# Patient Record
Sex: Male | Born: 1935 | Race: White | Hispanic: No | Marital: Married | Smoking: Never smoker
Health system: Southern US, Community
[De-identification: ages and names within clinical notes are randomized; demographics above are authoritative.]

## PROBLEM LIST (undated history)

## (undated) DIAGNOSIS — H9190 Unspecified hearing loss, unspecified ear: Secondary | ICD-10-CM

## (undated) DIAGNOSIS — C801 Malignant (primary) neoplasm, unspecified: Secondary | ICD-10-CM

## (undated) DIAGNOSIS — R638 Other symptoms and signs concerning food and fluid intake: Secondary | ICD-10-CM

## (undated) DIAGNOSIS — Z9289 Personal history of other medical treatment: Secondary | ICD-10-CM

## (undated) DIAGNOSIS — E7800 Pure hypercholesterolemia, unspecified: Secondary | ICD-10-CM

## (undated) DIAGNOSIS — E785 Hyperlipidemia, unspecified: Secondary | ICD-10-CM

## (undated) DIAGNOSIS — R590 Localized enlarged lymph nodes: Secondary | ICD-10-CM

## (undated) DIAGNOSIS — I10 Essential (primary) hypertension: Secondary | ICD-10-CM

## (undated) DIAGNOSIS — I259 Chronic ischemic heart disease, unspecified: Secondary | ICD-10-CM

## (undated) DIAGNOSIS — C099 Malignant neoplasm of tonsil, unspecified: Secondary | ICD-10-CM

## (undated) DIAGNOSIS — N289 Disorder of kidney and ureter, unspecified: Secondary | ICD-10-CM

## (undated) DIAGNOSIS — C799 Secondary malignant neoplasm of unspecified site: Secondary | ICD-10-CM

## (undated) DIAGNOSIS — Z9221 Personal history of antineoplastic chemotherapy: Secondary | ICD-10-CM

## (undated) DIAGNOSIS — M1990 Unspecified osteoarthritis, unspecified site: Secondary | ICD-10-CM

## (undated) DIAGNOSIS — Z5111 Encounter for antineoplastic chemotherapy: Secondary | ICD-10-CM

## (undated) DIAGNOSIS — R479 Unspecified speech disturbances: Secondary | ICD-10-CM

## (undated) HISTORY — DX: Localized enlarged lymph nodes: R59.0

## (undated) HISTORY — PX: CATARACT EXTRACTION: SUR2

## (undated) HISTORY — DX: Encounter for antineoplastic chemotherapy: Z51.11

## (undated) HISTORY — DX: Personal history of antineoplastic chemotherapy: Z92.21

## (undated) HISTORY — DX: Unspecified osteoarthritis, unspecified site: M19.90

## (undated) HISTORY — DX: Disorder of kidney and ureter, unspecified: N28.9

## (undated) HISTORY — DX: Unspecified speech disturbances: R47.9

## (undated) HISTORY — DX: Personal history of other medical treatment: Z92.89

## (undated) HISTORY — DX: Pure hypercholesterolemia, unspecified: E78.00

## (undated) HISTORY — DX: Malignant neoplasm of tonsil, unspecified: C09.9

## (undated) HISTORY — DX: Malignant (primary) neoplasm, unspecified: C80.1

## (undated) HISTORY — DX: Hyperlipidemia, unspecified: E78.5

## (undated) HISTORY — DX: Unspecified hearing loss, unspecified ear: H91.90

## (undated) HISTORY — DX: Chronic ischemic heart disease, unspecified: I25.9

## (undated) HISTORY — DX: Secondary malignant neoplasm of unspecified site: C79.9

## (undated) HISTORY — PX: TONSILLECTOMY: SUR1361

## (undated) HISTORY — DX: Other symptoms and signs concerning food and fluid intake: R63.8

## (undated) HISTORY — DX: Essential (primary) hypertension: I10

---

## 1975-12-10 DIAGNOSIS — C801 Malignant (primary) neoplasm, unspecified: Secondary | ICD-10-CM

## 1975-12-10 HISTORY — DX: Malignant (primary) neoplasm, unspecified: C80.1

## 2001-07-25 ENCOUNTER — Ambulatory Visit (HOSPITAL_COMMUNITY): Admission: RE | Admit: 2001-07-25 | Discharge: 2001-07-25 | Payer: Self-pay | Admitting: *Deleted

## 2003-04-08 ENCOUNTER — Ambulatory Visit (HOSPITAL_COMMUNITY): Admission: RE | Admit: 2003-04-08 | Discharge: 2003-04-08 | Payer: Self-pay | Admitting: Gastroenterology

## 2007-12-08 HISTORY — PX: CARDIOVASCULAR STRESS TEST: SHX262

## 2009-08-09 HISTORY — PX: OTHER SURGICAL HISTORY: SHX169

## 2009-09-04 ENCOUNTER — Inpatient Hospital Stay (HOSPITAL_COMMUNITY): Admission: RE | Admit: 2009-09-04 | Discharge: 2009-09-05 | Payer: Self-pay | Admitting: Cardiology

## 2009-09-04 HISTORY — PX: CARDIAC CATHETERIZATION: SHX172

## 2009-09-13 ENCOUNTER — Inpatient Hospital Stay (HOSPITAL_COMMUNITY): Admission: RE | Admit: 2009-09-13 | Discharge: 2009-09-14 | Payer: Self-pay | Admitting: Cardiology

## 2009-09-13 HISTORY — PX: CARDIAC CATHETERIZATION: SHX172

## 2010-07-23 ENCOUNTER — Ambulatory Visit: Payer: Self-pay | Admitting: Cardiology

## 2011-01-29 ENCOUNTER — Ambulatory Visit (INDEPENDENT_AMBULATORY_CARE_PROVIDER_SITE_OTHER): Payer: Medicare Other | Admitting: Cardiology

## 2011-01-29 DIAGNOSIS — I2510 Atherosclerotic heart disease of native coronary artery without angina pectoris: Secondary | ICD-10-CM

## 2011-01-29 DIAGNOSIS — E7800 Pure hypercholesterolemia, unspecified: Secondary | ICD-10-CM

## 2011-01-29 DIAGNOSIS — I10 Essential (primary) hypertension: Secondary | ICD-10-CM

## 2011-03-14 LAB — BASIC METABOLIC PANEL
Calcium: 8.7 mg/dL (ref 8.4–10.5)
Creatinine, Ser: 1.67 mg/dL — ABNORMAL HIGH (ref 0.4–1.5)
GFR calc Af Amer: 49 mL/min — ABNORMAL LOW (ref >60)
GFR calc non Af Amer: 41 mL/min — ABNORMAL LOW (ref >60)
Glucose, Bld: 101 mg/dL — ABNORMAL HIGH (ref 70–99)
Sodium: 139 mEq/L (ref 135–145)

## 2011-03-14 LAB — CBC
Hemoglobin: 12.1 g/dL — ABNORMAL LOW (ref 13.0–17.0)
RDW: 12.2 % (ref 11.5–15.5)

## 2011-03-15 LAB — CBC
HCT: 40.7 % (ref 39.0–52.0)
Hemoglobin: 14.1 g/dL (ref 13.0–17.0)
RBC: 4.33 MIL/uL (ref 4.22–5.81)
RDW: 12.6 % (ref 11.5–15.5)
WBC: 7.1 10*3/uL (ref 4.0–10.5)

## 2011-03-15 LAB — BASIC METABOLIC PANEL
GFR calc Af Amer: 50 mL/min — ABNORMAL LOW (ref >60)
GFR calc non Af Amer: 42 mL/min — ABNORMAL LOW (ref >60)
Glucose, Bld: 98 mg/dL (ref 70–99)
Potassium: 4.5 mEq/L (ref 3.5–5.1)
Sodium: 139 mEq/L (ref 135–145)

## 2011-04-26 NOTE — Op Note (Signed)
   NAME:  Jose Frank, Jose Frank                          ACCOUNT NO.:  [id number]   MEDICAL RECORD NO.:  [id number]                   PATIENT TYPE:  AMB   LOCATION:  ENDO                                 FACILITY:  [facility]   PHYSICIAN:  James L. Malon Kindle., M.D.          DATE OF BIRTH:  1935-09-14   DATE OF PROCEDURE:  04/08/2003  DATE OF DISCHARGE:                                 OPERATIVE REPORT   PROCEDURE:  Colonoscopy.   MEDICATIONS:  Fentanyl 50 mcg, Versed 5 mg IV.   INDICATIONS:  Colon cancer screening.   DESCRIPTION OF PROCEDURE:  The procedure had been explained to the patient  and consent obtained.  With the patient in the left lateral decubitus  position, the Olympus scope was inserted and advanced under direct  visualization.  The prep was excellent, and we were able to reach the cecum  without difficulty, the appendiceal orifice and the ileocecal valve seen.  The scope withdrawn and the cecum, ascending colon, descending, and sigmoid  colon were seen well.  No polyps were seen.  The rectum was free of polyps  as well.  The scope was withdrawn.  The patient tolerated the procedure well  and was maintained on low-flow oxygen and pulse oximetry throughout the  procedure.   ASSESSMENT:  Normal screening colonoscopy.  Will recommend checking the  stool yearly for blood and possibly repeating the procedure in 10 years if  clinically appropriate.                                               James L. Malon Kindle., M.D.    Waldron Session  D:  04/08/2003  T:  04/08/2003  Job:  [id number]   cc:   Al Decant. Janey Greaser, M.D.  [address]  [place]  [place] [address]  Fax: [phone]

## 2011-07-19 ENCOUNTER — Encounter: Payer: Self-pay | Admitting: Nurse Practitioner

## 2011-07-29 ENCOUNTER — Encounter: Payer: Self-pay | Admitting: Nurse Practitioner

## 2011-07-29 ENCOUNTER — Ambulatory Visit (INDEPENDENT_AMBULATORY_CARE_PROVIDER_SITE_OTHER): Payer: Medicare Other | Admitting: Nurse Practitioner

## 2011-07-29 ENCOUNTER — Other Ambulatory Visit: Payer: Medicare Other | Admitting: *Deleted

## 2011-07-29 VITALS — BP 140/94 | HR 70 | Ht 71.0 in | Wt 193.0 lb

## 2011-07-29 DIAGNOSIS — I10 Essential (primary) hypertension: Secondary | ICD-10-CM

## 2011-07-29 DIAGNOSIS — N189 Chronic kidney disease, unspecified: Secondary | ICD-10-CM

## 2011-07-29 DIAGNOSIS — N289 Disorder of kidney and ureter, unspecified: Secondary | ICD-10-CM | POA: Insufficient documentation

## 2011-07-29 DIAGNOSIS — I2510 Atherosclerotic heart disease of native coronary artery without angina pectoris: Secondary | ICD-10-CM

## 2011-07-29 DIAGNOSIS — E785 Hyperlipidemia, unspecified: Secondary | ICD-10-CM

## 2011-07-29 LAB — LIPID PANEL
Cholesterol: 160 mg/dL (ref 0–200)
HDL: 61.3 mg/dL (ref >39.00)
LDL Cholesterol: 79 mg/dL (ref 0–99)
Total CHOL/HDL Ratio: 3
Triglycerides: 99 mg/dL (ref 0.0–149.0)
VLDL: 19.8 mg/dL (ref 0.0–40.0)

## 2011-07-29 LAB — CBC WITH DIFFERENTIAL/PLATELET
Basophils Absolute: 0 10*3/uL (ref 0.0–0.1)
Basophils Relative: 0.6 % (ref 0.0–3.0)
Eosinophils Absolute: 0.1 10*3/uL (ref 0.0–0.7)
Eosinophils Relative: 2.1 % (ref 0.0–5.0)
HCT: 45.2 % (ref 39.0–52.0)
Hemoglobin: 15.2 g/dL (ref 13.0–17.0)
Lymphocytes Relative: 29.8 % (ref 12.0–46.0)
Lymphs Abs: 1.6 10*3/uL (ref 0.7–4.0)
MCHC: 33.6 g/dL (ref 30.0–36.0)
MCV: 97.2 fl (ref 78.0–100.0)
Monocytes Absolute: 0.4 10*3/uL (ref 0.1–1.0)
Monocytes Relative: 6.7 % (ref 3.0–12.0)
Neutro Abs: 3.3 10*3/uL (ref 1.4–7.7)
Neutrophils Relative %: 60.8 % (ref 43.0–77.0)
Platelets: 174 10*3/uL (ref 150.0–400.0)
RBC: 4.65 Mil/uL (ref 4.22–5.81)
RDW: 12.9 % (ref 11.5–14.6)
WBC: 5.5 10*3/uL (ref 4.5–10.5)

## 2011-07-29 LAB — BASIC METABOLIC PANEL
BUN: 26 mg/dL — ABNORMAL HIGH (ref 6–23)
CO2: 26 mEq/L (ref 19–32)
Calcium: 9.5 mg/dL (ref 8.4–10.5)
Chloride: 108 mEq/L (ref 96–112)
Creatinine, Ser: 1.7 mg/dL — ABNORMAL HIGH (ref 0.4–1.5)
GFR: 43.2 mL/min — ABNORMAL LOW (ref >60.00)
Glucose, Bld: 110 mg/dL — ABNORMAL HIGH (ref 70–99)
Potassium: 5.9 mEq/L — ABNORMAL HIGH (ref 3.5–5.1)
Sodium: 143 mEq/L (ref 135–145)

## 2011-07-29 NOTE — Progress Notes (Signed)
    Jose Frank Date of Birth: [birth year]   History of Present Illness: Jose Frank is seen back today for his 6 month visit. He is seen for Dr. Excell Seltzer. He is a former patient of Dr. Ronnald Nian. He is doing well. Has just returned from trip to [place]. He has known CAD with prior PCI's. His last procedure was in September/October of 2010 when he had DES to the LCX followed by 4 DES to the RCA. He is on Plavix. He does bruise easily. No chest pain. He remains active. Blood pressure at home is good. He did not take his medicines today.   Current Outpatient Prescriptions on File Prior to Visit  Medication Sig Dispense Refill  . aspirin 81 MG tablet Take 81 mg by mouth daily.        . clopidogrel (PLAVIX) 75 MG tablet Take 75 mg by mouth daily.        . colchicine 0.6 MG tablet Take 0.6 mg by mouth daily.        . metoprolol (LOPRESSOR) 50 MG tablet Take 50 mg by mouth 2 (two) times daily. 50 MG IN THE AM, 25 MG IN THE PM       . Multiple Vitamin (MULTIVITAMIN) tablet Take 1 tablet by mouth daily.        . nitroGLYCERIN (NITROSTAT) 0.4 MG SL tablet Place 0.4 mg under the tongue every 5 (five) minutes as needed.        . simvastatin (ZOCOR) 40 MG tablet Take 40 mg by mouth at bedtime.          Allergies  Allergen Reactions  . Demerol   . Ibuprofen     Past Medical History  Diagnosis Date  . IHD (ischemic heart disease)     Remote PCI to distal RCA and atherectomy of th LAD in 1996; S/P PCI to LCX and RCA in 2010.  Marland Kitchen Hyperlipidemia   . Hypertension   . Renal insufficiency     on Diovan    Past Surgical History  Procedure Date  . Cardiac catheterization 09/13/2009  . Cardiac catheterization 09/04/2009    EF 60%  . Tonsillectomy   . Pci 08/2009    TO THE LCX AND RCA  . Cardiovascular stress test 12/08/2007    EF 57%    History  Smoking status  . Never Smoker   Smokeless tobacco  . Not on file    History  Alcohol Use No    Family History  Problem Relation Age of Onset  .  Stroke Mother   . Heart disease Father     Review of Systems: The review of systems is positive for mild arthritis. No chest pain or shortness of breath. He does bruise easily.  All other systems were reviewed and are negative.  Physical Exam: BP 140/94  Pulse 70  Ht 5\' 11"  (1.803 m)  Wt 193 lb (87.544 kg)  BMI 26.92 kg/m2  LABORATORY DATA: PENDING  Assessment / Plan:

## 2011-07-29 NOTE — Assessment & Plan Note (Signed)
Labs are checked today. Remains off of his ARB.

## 2011-07-29 NOTE — Patient Instructions (Addendum)
Stay on your current medicines. Continue to monitor your blood pressure at home.  We will see you back in 6 months. We will consider stress testing on your return. You will see Dr. Tonny Bollman at your next visit. Call for any problems.

## 2011-07-29 NOTE — Assessment & Plan Note (Signed)
No medicines taken today. His readings are good from home. He will continue to monitor. No change in his medicines.

## 2011-07-29 NOTE — Assessment & Plan Note (Signed)
Labs are checked today.  

## 2011-07-29 NOTE — Assessment & Plan Note (Signed)
He is doing well. No return of chest pain. Will see back in 6 months. Patient is agreeable to this plan and will call if any problems develop in the interim.

## 2011-07-30 ENCOUNTER — Telehealth: Payer: Self-pay | Admitting: *Deleted

## 2011-07-30 NOTE — Telephone Encounter (Signed)
Notified of lab results. Sent copy to Dr. Zachery Dauer and to patient.

## 2011-07-30 NOTE — Telephone Encounter (Signed)
Message copied by Lorayne Bender on Tue Jul 30, 2011 11:35 AM ------      Message from: Rosalio Macadamia      Created: Mon Jul 29, 2011  3:38 PM       Ok to report. Labs are satisfactory.  Has known renal insuffiency. Needs to get back on diet and exercise plan. Blood sugar a little higher this time. Potassium is up and probably needs repeat potassium. ? Eating lots of fruit? Stay on same medicines. Recheck BMET/HPF/LIPIDS  in 6 months.

## 2011-11-20 ENCOUNTER — Other Ambulatory Visit (HOSPITAL_COMMUNITY): Payer: Self-pay | Admitting: Gastroenterology

## 2011-11-26 ENCOUNTER — Ambulatory Visit (HOSPITAL_COMMUNITY)
Admission: RE | Admit: 2011-11-26 | Discharge: 2011-11-26 | Disposition: A | Discharge status: Home / Self Care | Payer: Medicare Other | Source: Ambulatory Visit | Attending: Gastroenterology | Admitting: Gastroenterology

## 2011-11-26 DIAGNOSIS — Z85819 Personal history of malignant neoplasm of unspecified site of lip, oral cavity, and pharynx: Secondary | ICD-10-CM | POA: Insufficient documentation

## 2011-11-26 DIAGNOSIS — R1310 Dysphagia, unspecified: Secondary | ICD-10-CM | POA: Insufficient documentation

## 2011-12-09 ENCOUNTER — Other Ambulatory Visit: Payer: Self-pay | Admitting: Nurse Practitioner

## 2011-12-16 ENCOUNTER — Ambulatory Visit (INDEPENDENT_AMBULATORY_CARE_PROVIDER_SITE_OTHER): Payer: Medicare Other | Admitting: Surgery

## 2011-12-16 ENCOUNTER — Encounter (INDEPENDENT_AMBULATORY_CARE_PROVIDER_SITE_OTHER): Payer: Self-pay | Admitting: Surgery

## 2011-12-16 VITALS — BP 140/94 | HR 64 | Temp 97.8°F | Resp 18 | Ht 72.0 in | Wt 189.0 lb

## 2011-12-16 DIAGNOSIS — D171 Benign lipomatous neoplasm of skin and subcutaneous tissue of trunk: Secondary | ICD-10-CM | POA: Insufficient documentation

## 2011-12-16 DIAGNOSIS — D1739 Benign lipomatous neoplasm of skin and subcutaneous tissue of other sites: Secondary | ICD-10-CM

## 2011-12-16 NOTE — Progress Notes (Signed)
Patient ID: Jose Frank, male   DOB: Dec 15, 1934, 77 y.o.   MRN: [id number]  Chief Complaint  Patient presents with  . Other    new pt- determine a seb cyst vs. lipoma on buttock    HPI Jose Frank is a 77 y.o. male.  The patient presents at the request of Dr. Zachery Dauer due to a mass overlying his right buttock.  It has been present for 10 years. It has not grown much over 10 years. There is no history of drainage. There is no history of infection. It causes mild discomfort when sitting.  HPI  Past Medical History  Diagnosis Date  . IHD (ischemic heart disease)     Remote PCI to distal RCA and atherectomy of th LAD in 1996; S/P PCI to LCX and RCA in 2010.  Marland Kitchen Hyperlipidemia   . Hypertension   . Renal insufficiency     on Diovan  . Cancer     tonsil    Past Surgical History  Procedure Date  . Cardiac catheterization 09/13/2009  . Cardiac catheterization 09/04/2009    EF 60%  . Tonsillectomy   . Pci 08/2009    TO THE LCX AND RCA  . Cardiovascular stress test 12/08/2007    EF 57%    Family History  Problem Relation Age of Onset  . Stroke Mother   . Heart disease Father     Social History History  Substance Use Topics  . Smoking status: Never Smoker   . Smokeless tobacco: Not on file  . Alcohol Use: Yes     occasional    Allergies  Allergen Reactions  . Demerol   . Ibuprofen     Current Outpatient Prescriptions  Medication Sig Dispense Refill  . aspirin 81 MG tablet Take 81 mg by mouth daily.        . clopidogrel (PLAVIX) 75 MG tablet TAKE 1 TABLET DAILY  90 tablet  2  . glucosamine-chondroitin 500-400 MG tablet Take 1 tablet by mouth 2 (two) times daily.        . metoprolol (LOPRESSOR) 50 MG tablet Take 50 mg by mouth 2 (two) times daily. 50 MG IN THE AM, 25 MG IN THE PM       . Multiple Vitamin (MULTIVITAMIN) tablet Take 1 tablet by mouth daily.        . nitroGLYCERIN (NITROSTAT) 0.4 MG SL tablet Place 0.4 mg under the tongue every 5 (five) minutes as  needed.        . simvastatin (ZOCOR) 40 MG tablet Take 40 mg by mouth at bedtime.          Review of Systems Review of Systems  Constitutional: Negative for fever, chills and unexpected weight change.  HENT: Negative for hearing loss, congestion, sore throat, trouble swallowing and voice change.   Eyes: Negative for visual disturbance.  Respiratory: Negative for cough and wheezing.   Cardiovascular: Negative for chest pain, palpitations and leg swelling.  Gastrointestinal: Negative for nausea, vomiting, abdominal pain, diarrhea, constipation, blood in stool, abdominal distention, anal bleeding and rectal pain.  Genitourinary: Negative for hematuria and difficulty urinating.  Musculoskeletal: Negative for arthralgias.  Skin: Negative for rash and wound.  Neurological: Negative for seizures, syncope, weakness and headaches.  Hematological: Negative for adenopathy. Does not bruise/bleed easily.  Psychiatric/Behavioral: Negative for confusion.    Blood pressure 140/94, pulse 64, temperature 97.8 F (36.6 C), temperature source Temporal, resp. rate 18, height 6' (1.829 m), weight 189 lb (85.73  kg).  Physical Exam Physical Exam  Constitutional: He appears well-developed and well-nourished.  HENT:  Head: Normocephalic and atraumatic.  Eyes: Pupils are equal, round, and reactive to light.  Neck: Normal range of motion.  Cardiovascular: Normal rate and regular rhythm.   Pulmonary/Chest: Effort normal and breath sounds normal.  Musculoskeletal: Normal range of motion.  Skin: Skin is warm and dry.     Psychiatric: He has a normal mood and affect. His speech is normal and behavior is normal.    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    Plan    It has been present for 10 years without change.  He does not wish to have it removed at this point.  Will follow up as needed.       [name]. 12/16/2011, 10:51 AM

## 2011-12-16 NOTE — Patient Instructions (Signed)
Lipoma A lipoma is a noncancerous (benign) tumor composed of fat cells. They are usually found under the skin (subcutaneous). A lipoma may occur in any tissue of the body that contains fat. Common areas for lipomas to appear include the back, shoulders, buttocks, and thighs. Lipomas are a very common soft tissue growth. They are soft and grow slowly. Most problems caused by a lipoma depend on where it is growing. DIAGNOSIS  A lipoma can be diagnosed with a physical exam. These tumors rarely become cancerous, but radiographic studies can help determine this for certain. Studies used may include:  Computerized X-ray scans (CT or CAT scan).   Computerized magnetic scans (MRI).  TREATMENT  Small lipomas that are not causing problems may be watched. If a lipoma continues to enlarge or causes problems, removal is often the best treatment. Lipomas can also be removed to improve appearance. Surgery is done to remove the fatty cells and the surrounding capsule. Most often, this is done with medicine that numbs the area (local anesthetic). The removed tissue is examined under a microscope to make sure it is not cancerous. Keep all follow-up appointments with your caregiver. SEEK MEDICAL CARE IF:   The lipoma becomes larger or hard.   The lipoma becomes painful, red, or increasingly swollen. These could be signs of infection or a more serious condition.  Document Released: 11/15/2002 Document Revised: 08/07/2011 Document Reviewed: 04/27/2010 [employer] Patient Information 2012 [employer]. 

## 2011-12-31 DIAGNOSIS — R1310 Dysphagia, unspecified: Secondary | ICD-10-CM | POA: Diagnosis not present

## 2012-01-07 DIAGNOSIS — R1310 Dysphagia, unspecified: Secondary | ICD-10-CM | POA: Diagnosis not present

## 2012-02-11 ENCOUNTER — Encounter: Payer: Self-pay | Admitting: Cardiovascular Disease

## 2012-02-11 DIAGNOSIS — E7800 Pure hypercholesterolemia, unspecified: Secondary | ICD-10-CM | POA: Diagnosis not present

## 2012-02-11 DIAGNOSIS — I10 Essential (primary) hypertension: Secondary | ICD-10-CM | POA: Diagnosis not present

## 2012-02-19 DIAGNOSIS — N401 Enlarged prostate with lower urinary tract symptoms: Secondary | ICD-10-CM | POA: Diagnosis not present

## 2012-02-26 DIAGNOSIS — N401 Enlarged prostate with lower urinary tract symptoms: Secondary | ICD-10-CM | POA: Diagnosis not present

## 2012-02-26 DIAGNOSIS — R351 Nocturia: Secondary | ICD-10-CM | POA: Diagnosis not present

## 2012-03-05 ENCOUNTER — Encounter: Payer: Self-pay | Admitting: Cardiovascular Disease

## 2012-03-05 ENCOUNTER — Ambulatory Visit (INDEPENDENT_AMBULATORY_CARE_PROVIDER_SITE_OTHER): Payer: Medicare Other | Admitting: Cardiovascular Disease

## 2012-03-05 VITALS — BP 143/86 | HR 66 | Ht 72.0 in | Wt 191.0 lb

## 2012-03-05 DIAGNOSIS — I10 Essential (primary) hypertension: Secondary | ICD-10-CM

## 2012-03-05 DIAGNOSIS — E785 Hyperlipidemia, unspecified: Secondary | ICD-10-CM | POA: Diagnosis not present

## 2012-03-05 DIAGNOSIS — I2510 Atherosclerotic heart disease of native coronary artery without angina pectoris: Secondary | ICD-10-CM

## 2012-03-05 NOTE — Patient Instructions (Signed)
Your physician wants you to follow-up in: 1 year with Dr. Cooper.  You will receive a reminder letter in the mail two months in advance. If you don't receive a letter, please call our office to schedule the follow-up appointment.  

## 2012-03-12 ENCOUNTER — Encounter: Payer: Self-pay | Admitting: Cardiovascular Disease

## 2012-03-12 NOTE — Progress Notes (Signed)
   HPI:  77 year old gentleman presenting for followup of coronary artery disease. The patient has undergone multiple PCI procedures, last in 2001 he underwent drug-eluting stent implantation in the left circumflex and right coronary arteries. He is maintained on long-term Plavix.  The patient feels well and he is physically active. He specifically denies chest pain or pressure. He denies dyspnea, edema, or palpitations. He is compliant with his medical program.  Outpatient Encounter Prescriptions as of 03/05/2012  Medication Sig Dispense Refill  . aspirin 81 MG tablet Take 81 mg by mouth daily.        . clopidogrel (PLAVIX) 75 MG tablet TAKE 1 TABLET DAILY  90 tablet  2  . glucosamine-chondroitin 500-400 MG tablet Take 1 tablet by mouth 2 (two) times daily.        . metoprolol (LOPRESSOR) 50 MG tablet Take 50 mg by mouth 2 (two) times daily. 50 MG IN THE AM, 25 MG IN THE PM       . Multiple Vitamin (MULTIVITAMIN) tablet Take 1 tablet by mouth daily.        . nitroGLYCERIN (NITROSTAT) 0.4 MG SL tablet Place 0.4 mg under the tongue every 5 (five) minutes as needed.        . simvastatin (ZOCOR) 40 MG tablet Take 40 mg by mouth at bedtime.          Allergies  Allergen Reactions  . Demerol   . Ibuprofen     Past Medical History  Diagnosis Date  . IHD (ischemic heart disease)     Remote PCI to distal RCA and atherectomy of th LAD in 1996; S/P PCI to LCX and RCA in 2010.  Marland Kitchen Hyperlipidemia   . Hypertension   . Renal insufficiency     on Diovan  . Cancer     tonsil    ROS: Negative except as per HPI  BP 143/86  Pulse 66  Ht 6' (1.829 m)  Wt 86.637 kg (191 lb)  BMI 25.90 kg/m2  PHYSICAL EXAM: Pt is alert and oriented, NAD HEENT: normal Neck: JVP - normal, carotids 2+= without bruits Lungs: CTA bilaterally CV: RRR without murmur or gallop Abd: soft, NT, Positive BS, no hepatomegaly Ext: no C/C/E, distal pulses intact and equal Skin: warm/dry no rash  EKG:  Normal sinus rhythm  66 beats per minute, within normal limits.  ASSESSMENT AND PLAN:

## 2012-03-12 NOTE — Assessment & Plan Note (Signed)
Lipids are followed by Dr. Zachery Dauer. The patient is on simvastatin 40 mg.

## 2012-03-12 NOTE — Assessment & Plan Note (Signed)
Impression mildly elevated but he reports good blood pressure readings at home. He will continue on metoprolol.

## 2012-03-12 NOTE — Assessment & Plan Note (Signed)
The patient is stable without angina. We discussed whether he should stay on long-term dual antiplatelet therapy, and I would favor this. The patient has multiple stents and he is tolerating the combination of aspirin and Plavix without bleeding problems. He will otherwise continue on his current therapy and followup in 1 year.

## 2012-04-18 DIAGNOSIS — J209 Acute bronchitis, unspecified: Secondary | ICD-10-CM | POA: Diagnosis not present

## 2012-08-12 ENCOUNTER — Other Ambulatory Visit: Payer: Self-pay | Admitting: Nurse Practitioner

## 2012-08-13 ENCOUNTER — Encounter: Payer: Self-pay | Admitting: Cardiovascular Disease

## 2012-08-13 DIAGNOSIS — E7800 Pure hypercholesterolemia, unspecified: Secondary | ICD-10-CM | POA: Diagnosis not present

## 2012-08-13 DIAGNOSIS — I10 Essential (primary) hypertension: Secondary | ICD-10-CM | POA: Diagnosis not present

## 2012-08-13 DIAGNOSIS — Z23 Encounter for immunization: Secondary | ICD-10-CM | POA: Diagnosis not present

## 2012-08-13 DIAGNOSIS — Z79899 Other long term (current) drug therapy: Secondary | ICD-10-CM | POA: Diagnosis not present

## 2012-08-25 DIAGNOSIS — H524 Presbyopia: Secondary | ICD-10-CM | POA: Diagnosis not present

## 2012-08-25 DIAGNOSIS — H2510 Age-related nuclear cataract, unspecified eye: Secondary | ICD-10-CM | POA: Diagnosis not present

## 2012-08-25 DIAGNOSIS — H35039 Hypertensive retinopathy, unspecified eye: Secondary | ICD-10-CM | POA: Diagnosis not present

## 2012-09-08 DIAGNOSIS — R040 Epistaxis: Secondary | ICD-10-CM | POA: Diagnosis not present

## 2012-09-08 DIAGNOSIS — I10 Essential (primary) hypertension: Secondary | ICD-10-CM | POA: Diagnosis not present

## 2012-09-08 DIAGNOSIS — J329 Chronic sinusitis, unspecified: Secondary | ICD-10-CM | POA: Diagnosis not present

## 2013-01-16 ENCOUNTER — Other Ambulatory Visit: Payer: Self-pay | Admitting: Cardiovascular Disease

## 2013-02-18 DIAGNOSIS — Z125 Encounter for screening for malignant neoplasm of prostate: Secondary | ICD-10-CM | POA: Diagnosis not present

## 2013-02-19 ENCOUNTER — Encounter: Payer: Self-pay | Admitting: Cardiovascular Disease

## 2013-02-19 ENCOUNTER — Ambulatory Visit (INDEPENDENT_AMBULATORY_CARE_PROVIDER_SITE_OTHER): Payer: Medicare Other | Admitting: Cardiovascular Disease

## 2013-02-19 VITALS — BP 124/82 | HR 62 | Ht 72.0 in | Wt 183.0 lb

## 2013-02-19 DIAGNOSIS — I2510 Atherosclerotic heart disease of native coronary artery without angina pectoris: Secondary | ICD-10-CM | POA: Diagnosis not present

## 2013-02-19 MED ORDER — NITROGLYCERIN 0.4 MG SL SUBL
0.4000 mg | SUBLINGUAL_TABLET | SUBLINGUAL | Status: DC | PRN
Start: 2013-02-19 — End: 2016-05-13

## 2013-02-19 MED ORDER — CLOPIDOGREL BISULFATE 75 MG PO TABS: ORAL_TABLET | ORAL | Status: DC

## 2013-02-19 NOTE — Progress Notes (Signed)
   HPI:  78 year old gentleman presenting for followup evaluation. The patient has coronary artery disease with a history of multiple PCI procedures. His most recent PCI procedure was in 2010 when he was treated with complex PCI utilizing 4 stents in the right coronary artery.  He continues to jog regularly for exercise. He runs about 5 miles every other day. This takes him approximately one hour and 15 minutes. He specifically denies exertional symptoms. He denies chest pain or pressure, dyspnea, or lightheadedness. He has some aches and pains but no other specific complaints. His joints feel better on days that he runs. He remains compliant with his medications. He admits to some mild hemorrhoidal bleeding, but has had no other bleeding problems on long-term dual antiplatelet therapy.  Outpatient Encounter Prescriptions as of 02/19/2013  Medication Sig Dispense Refill  . aspirin 81 MG tablet Take 81 mg by mouth daily.        . clopidogrel (PLAVIX) 75 MG tablet TAKE 1 TABLET DAILY  90 tablet  0  . glucosamine-chondroitin 500-400 MG tablet Take 1 tablet by mouth 2 (two) times daily.        . metoprolol (LOPRESSOR) 50 MG tablet Take 50 mg by mouth 2 (two) times daily. 50 MG IN THE AM, 25 MG IN THE PM       . Multiple Vitamin (MULTIVITAMIN) tablet Take 1 tablet by mouth daily.        . nitroGLYCERIN (NITROSTAT) 0.4 MG SL tablet Place 0.4 mg under the tongue every 5 (five) minutes as needed.        . simvastatin (ZOCOR) 40 MG tablet Take 40 mg by mouth at bedtime.         No facility-administered encounter medications on file as of 02/19/2013.    Allergies  Allergen Reactions  . Demerol   . Ibuprofen     Past Medical History  Diagnosis Date  . IHD (ischemic heart disease)     Remote PCI to distal RCA and atherectomy of th LAD in 1996; S/P PCI to LCX and RCA in 2010.  Marland Kitchen Hyperlipidemia   . Hypertension   . Renal insufficiency     on Diovan  . Cancer     tonsil    ROS: Negative except as  per HPI  BP 124/82  Pulse 62  Ht 6' (1.829 m)  Wt 83.008 kg (183 lb)  BMI 24.81 kg/m2  PHYSICAL EXAM: Pt is alert and oriented, NAD HEENT: normal Neck: JVP - normal, carotids 2+= without bruits Lungs: CTA bilaterally CV: RRR without murmur or gallop Abd: soft, NT, Positive BS, no hepatomegaly Ext: no C/C/E, distal pulses intact and equal Skin: warm/dry no rash  EKG:  Normal sinus rhythm 62 beats per minute, within normal limits.  ASSESSMENT AND PLAN: 1. Coronary artery disease, native vessel. The patient is stable without anginal symptoms. I have reviewed his previous cardiac catheterization reports. He's had fairly extensive stenting, especially the right coronary artery. I would favor continued dual antiplatelet therapy with aspirin and Plavix. His other medications are appropriate with a statin drug and beta blocker. No changes were made today.  2. Hyperlipidemia. Lipids are followed by his primary care physician. He is on simvastatin.  For followup I will see him back in 12 months unless problems arise.  Tonny Bollman 02/19/2013 4:01 PM

## 2013-02-19 NOTE — Patient Instructions (Addendum)
Your physician wants you to follow-up in: 1 YEAR with Dr Cooper.  You will receive a reminder letter in the mail two months in advance. If you don't receive a letter, please call our office to schedule the follow-up appointment.  Your physician recommends that you continue on your current medications as directed. Please refer to the Current Medication list given to you today.  

## 2013-02-25 DIAGNOSIS — R972 Elevated prostate specific antigen [PSA]: Secondary | ICD-10-CM | POA: Diagnosis not present

## 2013-02-25 DIAGNOSIS — R351 Nocturia: Secondary | ICD-10-CM | POA: Diagnosis not present

## 2013-02-25 DIAGNOSIS — N401 Enlarged prostate with lower urinary tract symptoms: Secondary | ICD-10-CM | POA: Diagnosis not present

## 2013-05-07 DIAGNOSIS — I10 Essential (primary) hypertension: Secondary | ICD-10-CM | POA: Diagnosis not present

## 2013-05-07 DIAGNOSIS — E7800 Pure hypercholesterolemia, unspecified: Secondary | ICD-10-CM | POA: Diagnosis not present

## 2013-05-07 DIAGNOSIS — L821 Other seborrheic keratosis: Secondary | ICD-10-CM | POA: Diagnosis not present

## 2013-05-14 DIAGNOSIS — L821 Other seborrheic keratosis: Secondary | ICD-10-CM | POA: Diagnosis not present

## 2013-05-14 DIAGNOSIS — R209 Unspecified disturbances of skin sensation: Secondary | ICD-10-CM | POA: Diagnosis not present

## 2013-07-07 ENCOUNTER — Telehealth: Payer: Self-pay | Admitting: *Deleted

## 2013-07-07 ENCOUNTER — Ambulatory Visit (INDEPENDENT_AMBULATORY_CARE_PROVIDER_SITE_OTHER)
Admission: RE | Admit: 2013-07-07 | Discharge: 2013-07-07 | Disposition: A | Discharge status: Home / Self Care | Payer: Medicare Other | Source: Ambulatory Visit | Attending: Physician Assistant | Admitting: Physician Assistant

## 2013-07-07 ENCOUNTER — Ambulatory Visit (INDEPENDENT_AMBULATORY_CARE_PROVIDER_SITE_OTHER): Payer: Medicare Other | Admitting: Physician Assistant

## 2013-07-07 ENCOUNTER — Encounter: Payer: Self-pay | Admitting: Physician Assistant

## 2013-07-07 VITALS — BP 138/88 | HR 54 | Ht 70.0 in | Wt 183.0 lb

## 2013-07-07 DIAGNOSIS — R079 Chest pain, unspecified: Secondary | ICD-10-CM

## 2013-07-07 DIAGNOSIS — E785 Hyperlipidemia, unspecified: Secondary | ICD-10-CM

## 2013-07-07 DIAGNOSIS — I2510 Atherosclerotic heart disease of native coronary artery without angina pectoris: Secondary | ICD-10-CM

## 2013-07-07 NOTE — Telephone Encounter (Signed)
pt notified about cxr. I will fax cxr to PCP. Pt verbalized understanding

## 2013-07-07 NOTE — Telephone Encounter (Signed)
Informed pt to not take his metoprolol 24 hr before his exercise myoview. pt agreed

## 2013-07-07 NOTE — Telephone Encounter (Signed)
Message copied by Tarri Fuller on Wed Jul 07, 2013  4:29 PM ------      Message from: [place], [place] T      Created: Wed Jul 07, 2013  1:38 PM       Chest x-ray without acute findings      Continue with current treatment plan.      Tereso Newcomer, PA-C        07/07/2013 1:37 PM ------

## 2013-07-07 NOTE — Patient Instructions (Addendum)
Your physician recommends that you schedule a follow-up appointment in:  KEEP APPOINTMENT WITH DR. Excell Seltzer 02/2014  Your physician has requested that you have en exercise stress myoview. Please follow instruction sheet, as given.  A chest x-ray takes a picture of the organs and structures inside the chest, including the heart, lungs, and blood vessels. This test can show several things, including, whether the heart is enlarges; whether fluid is building up in the lungs; and whether pacemaker / defibrillator leads are still in place.  Your physician recommends that you continue on your current medications as directed. Please refer to the Current Medication list given to you today.  Your physician has recommended you make the following change in your medication:   HOLD YOUR METOPROLOL 24HRS PRIOR TO STRESS MYOVIEW

## 2013-07-07 NOTE — Progress Notes (Signed)
[address] [place], [place]  [address] Phone: [phone] Fax:  [phone]  Date:  07/07/2013   ID:  Jose, Frank 1935-08-31, MRN [id number]  PCP:  Gaye Alken, MD  Cardiologist:  Dr. Tonny Bollman     History of Present Illness: Jose Frank is a 78 y.o. male who returns for the evaluation of chest pain. He has a history of CAD, status post intervention to the distal RCA and proximal LAD in 1996. Last LHC 08/2009: CFX 95%, proximal RCA 90%, distal RCA 95%, proximal LAD 40%, EF 60%, normal wall motion. PCI: Xience DES to the CFX. Staged PCI:  Xience DES x4 to the proximal, mid and distal RCA. Last seen by Dr. Excell Seltzer 02/2013. He typically jogs 3-4 miles every other day without significant problems. Recently he has noted some left upper chest discomfort as well as left shoulder discomfort. It seems to be somewhat positional. He denies dyspnea, syncope, orthopnea, PND or edema. He was concerned that he may have problems with his heart. He decided to stop jogging.   Wt Readings from Last 3 Encounters:  07/07/13 183 lb (83.008 kg)  02/19/13 183 lb (83.008 kg)  03/05/12 191 lb (86.637 kg)     Past Medical History  Diagnosis Date  . IHD (ischemic heart disease)     Remote PCI to distal RCA and atherectomy of th LAD in 1996; S/P PCI to LCX and RCA in 2010.  Marland Kitchen Hyperlipidemia   . Hypertension   . Renal insufficiency     on Diovan  . Cancer     tonsil    Current Outpatient Prescriptions  Medication Sig Dispense Refill  . aspirin 81 MG tablet Take 81 mg by mouth daily.        . clopidogrel (PLAVIX) 75 MG tablet TAKE 1 TABLET DAILY  90 tablet  3  . glucosamine-chondroitin 500-400 MG tablet Take 1 tablet by mouth 2 (two) times daily.        . metoprolol (LOPRESSOR) 50 MG tablet Take 50 mg by mouth 2 (two) times daily. 50 MG IN THE AM, 25 MG IN THE PM       . Multiple Vitamin (MULTIVITAMIN) tablet Take 1 tablet by mouth daily.        .  nitroGLYCERIN (NITROSTAT) 0.4 MG SL tablet Place 1 tablet (0.4 mg total) under the tongue every 5 (five) minutes as needed.  25 tablet  2  . simvastatin (ZOCOR) 40 MG tablet Take 40 mg by mouth at bedtime.         No current facility-administered medications for this visit.    Allergies:    Allergies  Allergen Reactions  . Demerol   . Ibuprofen     Social History:  The patient  reports that he has never smoked. He does not have any smokeless tobacco history on file. He reports that  drinks alcohol. He reports that he does not use illicit drugs.   ROS:  Please see the history of present illness.      All other systems reviewed and negative.   PHYSICAL EXAM: VS:  BP 138/88  Pulse 54  Ht 5\' 10"  (1.778 m)  Wt 183 lb (83.008 kg)  BMI 26.26 kg/m2 Well nourished, well developed, in no acute distress HEENT: normal Neck: no JVD Cardiac:  normal S1, S2; RRR; no murmur Lungs:  clear to auscultation bilaterally, no wheezing, rhonchi or rales Abd: soft, nontender, no hepatomegaly Ext: no edema MSK:  Left shoulder without crepitus or pain on palpation Skin: warm and dry Neuro:  CNs 2-12 intact, no focal abnormalities noted  EKG:  Sinus bradycardia, HR 54, normal axis, no acute changes     ASSESSMENT AND PLAN:  1. Chest Pain: Atypical for ischemia. He has not had stress testing since his last PCI in 2010. He is concerned about ischemia as a cause for his symptoms. I will arrange an ETT-Myoview. I will also arrange a chest x-ray. If these are unrevealing, he should follow up with his PCP. I suspect he may have some type of shoulder pathology contributing to symptoms. Of note, he was a college baseball and football player. 2. CAD: Arrange Myoview as noted. Continue aspirin, Plavix and statin. 3. Hyperlipidemia: Continue statin. 4. Disposition: Follow up with Dr. Excell Seltzer in 02/2014 or sooner if Myoview abnormal.  Signed, Tereso Newcomer, PA-C  07/07/2013 10:29 AM

## 2013-07-14 ENCOUNTER — Ambulatory Visit (HOSPITAL_COMMUNITY): Payer: Medicare Other | Attending: Cardiology | Admitting: Radiology

## 2013-07-14 VITALS — BP 144/87 | HR 61 | Ht 71.0 in | Wt 182.0 lb

## 2013-07-14 DIAGNOSIS — I10 Essential (primary) hypertension: Secondary | ICD-10-CM | POA: Diagnosis not present

## 2013-07-14 DIAGNOSIS — Z9861 Coronary angioplasty status: Secondary | ICD-10-CM | POA: Diagnosis not present

## 2013-07-14 DIAGNOSIS — R5381 Other malaise: Secondary | ICD-10-CM | POA: Diagnosis not present

## 2013-07-14 DIAGNOSIS — I209 Angina pectoris, unspecified: Secondary | ICD-10-CM | POA: Diagnosis not present

## 2013-07-14 DIAGNOSIS — E785 Hyperlipidemia, unspecified: Secondary | ICD-10-CM

## 2013-07-14 DIAGNOSIS — Z8249 Family history of ischemic heart disease and other diseases of the circulatory system: Secondary | ICD-10-CM | POA: Insufficient documentation

## 2013-07-14 DIAGNOSIS — R079 Chest pain, unspecified: Secondary | ICD-10-CM

## 2013-07-14 DIAGNOSIS — I2510 Atherosclerotic heart disease of native coronary artery without angina pectoris: Secondary | ICD-10-CM

## 2013-07-14 DIAGNOSIS — M25519 Pain in unspecified shoulder: Secondary | ICD-10-CM | POA: Diagnosis not present

## 2013-07-14 DIAGNOSIS — I4949 Other premature depolarization: Secondary | ICD-10-CM

## 2013-07-14 MED ORDER — TECHNETIUM TC 99M SESTAMIBI GENERIC - CARDIOLITE
10.0000 | Freq: Once | INTRAVENOUS | Status: AC | PRN
  Administered 2013-07-14: 10 via INTRAVENOUS

## 2013-07-14 MED ORDER — TECHNETIUM TC 99M SESTAMIBI GENERIC - CARDIOLITE
30.0000 | Freq: Once | INTRAVENOUS | Status: AC | PRN
  Administered 2013-07-14: 30 via INTRAVENOUS

## 2013-07-14 NOTE — Progress Notes (Signed)
[facility] SITE 3 NUCLEAR MED [address] [place], [place] [address] [phone]    Cardiology Nuclear Med Study  Jose Frank is a 78 y.o. male     MRN : [id number]     DOB: [birth year]  Procedure Date: 07/14/2013  Nuclear Med Background Indication for Stress Test:  Evaluation for Ischemia and Stent Patency History:  '96 PTCA; '08 MPS:EF=57% (no report); '10 Stents x 5 Cardiac Risk Factors: Family History - CAD, Hypertension and Lipids  Symptoms:  Fatigue and (L) Shoulder Pain (last episode being yesterday)   Nuclear Pre-Procedure Caffeine/Decaff Intake:  None NPO After: 5 pm   Lungs:  Clear. O2 Sat: 95% on room air. IV 0.9% NS with Angio Cath:  22g  IV Site: R Hand  IV Started by:  Bonnita Levan, RN  Chest Size (in):  42 Cup Size: n/a  Height: 5\' 11"  (1.803 m)  Weight:  182 lb (82.555 kg)  BMI:  Body mass index is 25.4 kg/(m^2). Tech Comments:  Patient held Lopressor x 24 hrs    Nuclear Med Study 1 or 2 day study: 1 day  Stress Test Type:  Stress  Reading MD: Marca Ancona, MD  Order Authorizing Provider:  Tonny Bollman, MD  Resting Radionuclide: Technetium 84m Sestamibi  Resting Radionuclide Dose: 11.0 mCi   Stress Radionuclide:  Technetium 9m Sestamibi  Stress Radionuclide Dose: 33.0 mCi           Stress Protocol Rest HR: 61 Stress HR: 137  Rest BP: 144/87 Stress BP: 212/94  Exercise Time (min): 6:30 METS: 7.0   Predicted Max HR: 144 bpm % Max HR: 95.14 bpm Rate Pressure Product: 21308   Dose of Adenosine (mg):  n/a Dose of Lexiscan: n/a mg  Dose of Atropine (mg): n/a Dose of Dobutamine: n/a mcg/kg/min (at max HR)  Stress Test Technologist: Smiley Houseman, CMA-N  Nuclear Technologist:  Harlow Asa, CNMT     Rest Procedure:  Myocardial perfusion imaging was performed at rest 45 minutes following the intravenous administration of Technetium 43m Sestamibi.  Rest ECG: NSR - Normal EKG  Stress Procedure:  The patient exercised on the  treadmill utilizing the Bruce Protocol for 6:30 minutes. The patient stopped due to a hypertensive response, while off his Lopressor for 24-hours as directed.  He denied any chest pain.  Technetium 75m Sestamibi was injected at peak exercise and myocardial perfusion imaging was performed after a brief delay.  Stress ECG: No significant change from baseline ECG  QPS Raw Data Images:  Normal; no motion artifact; normal heart/lung ratio. Stress Images:  Normal homogeneous uptake in all areas of the myocardium. Rest Images:  Normal homogeneous uptake in all areas of the myocardium. Subtraction (SDS):  There is no evidence of scar or ischemia. Transient Ischemic Dilatation (Normal <1.22):  n/a Lung/Heart Ratio (Normal <0.45):  0.36  Quantitative Gated Spect Images QGS EDV:  110 ml QGS ESV:  53 ml  Impression Exercise Capacity:  Fair exercise capacity. BP Response:  Hypertensive blood pressure response. Clinical Symptoms:  Fatigue, no chest pain.  ECG Impression:  No significant ST segment change suggestive of ischemia. Comparison with Prior Nuclear Study: No images to compare  Overall Impression:  Normal stress nuclear study except low normal to mildly decreased LV systolic function.   LV Ejection Fraction: 52%.  LV Wall Motion:  Mild global hypokinesis.   Marca Ancona 07/14/2013

## 2013-07-15 ENCOUNTER — Encounter: Payer: Self-pay | Admitting: Physician Assistant

## 2013-07-16 ENCOUNTER — Telehealth: Payer: Self-pay | Admitting: Nurse Practitioner

## 2013-07-16 DIAGNOSIS — I2510 Atherosclerotic heart disease of native coronary artery without angina pectoris: Secondary | ICD-10-CM

## 2013-07-16 NOTE — Telephone Encounter (Signed)
Reviewed stress test results with patient who verbalized understanding.  Patient in agreement to have ECHO and understands that someone from scheduling will call him with appointment.

## 2013-07-16 NOTE — Telephone Encounter (Signed)
Message copied by Levi Aland on Fri Jul 16, 2013 12:04 PM ------      Message from: [place], [place] T      Created: Thu Jul 15, 2013  1:57 PM       Stress test with normal perfusion      EF low normal - likely related to nuclear technique      Schedule Echocardiogram to assess LVF better.      Tereso Newcomer, PA-C        07/15/2013 1:56 PM ------

## 2013-07-23 ENCOUNTER — Ambulatory Visit (HOSPITAL_COMMUNITY): Payer: Medicare Other | Attending: Physician Assistant | Admitting: Radiology

## 2013-07-23 DIAGNOSIS — E785 Hyperlipidemia, unspecified: Secondary | ICD-10-CM | POA: Diagnosis not present

## 2013-07-23 DIAGNOSIS — I2510 Atherosclerotic heart disease of native coronary artery without angina pectoris: Secondary | ICD-10-CM

## 2013-07-23 DIAGNOSIS — R079 Chest pain, unspecified: Secondary | ICD-10-CM | POA: Insufficient documentation

## 2013-07-23 DIAGNOSIS — R072 Precordial pain: Secondary | ICD-10-CM

## 2013-07-23 NOTE — Progress Notes (Addendum)
Echocardiogram performed. Echo report faxed 

## 2013-07-24 ENCOUNTER — Encounter: Payer: Self-pay | Admitting: Physician Assistant

## 2013-07-26 ENCOUNTER — Telehealth: Payer: Self-pay | Admitting: *Deleted

## 2013-07-26 NOTE — Telephone Encounter (Signed)
pt notified about echo and myoview results with verbal understanding to all results

## 2013-07-26 NOTE — Telephone Encounter (Signed)
Message copied by Tarri Fuller on Mon Jul 26, 2013 12:27 PM ------      Message from: [place], [place] T      Created: Sat Jul 24, 2013 10:15 PM       Normal EF      Continue with current treatment plan.      Tereso Newcomer, PA-C        07/24/2013 10:15 PM ------

## 2013-08-27 DIAGNOSIS — H5200 Hypermetropia, unspecified eye: Secondary | ICD-10-CM | POA: Diagnosis not present

## 2013-08-27 DIAGNOSIS — H2510 Age-related nuclear cataract, unspecified eye: Secondary | ICD-10-CM | POA: Diagnosis not present

## 2013-08-27 DIAGNOSIS — H353190 Nonexudative age-related macular degeneration, unspecified eye, stage unspecified: Secondary | ICD-10-CM | POA: Diagnosis not present

## 2013-09-15 DIAGNOSIS — R972 Elevated prostate specific antigen [PSA]: Secondary | ICD-10-CM | POA: Diagnosis not present

## 2013-10-01 DIAGNOSIS — I10 Essential (primary) hypertension: Secondary | ICD-10-CM | POA: Diagnosis not present

## 2013-10-01 DIAGNOSIS — Z23 Encounter for immunization: Secondary | ICD-10-CM | POA: Diagnosis not present

## 2013-10-01 DIAGNOSIS — E7800 Pure hypercholesterolemia, unspecified: Secondary | ICD-10-CM | POA: Diagnosis not present

## 2013-10-29 DIAGNOSIS — R05 Cough: Secondary | ICD-10-CM | POA: Diagnosis not present

## 2013-10-29 DIAGNOSIS — R059 Cough, unspecified: Secondary | ICD-10-CM | POA: Diagnosis not present

## 2013-10-29 DIAGNOSIS — J029 Acute pharyngitis, unspecified: Secondary | ICD-10-CM | POA: Diagnosis not present

## 2013-12-20 ENCOUNTER — Other Ambulatory Visit: Payer: Self-pay | Admitting: Cardiovascular Disease

## 2014-02-18 DIAGNOSIS — M25519 Pain in unspecified shoulder: Secondary | ICD-10-CM | POA: Diagnosis not present

## 2014-02-18 DIAGNOSIS — E7800 Pure hypercholesterolemia, unspecified: Secondary | ICD-10-CM | POA: Diagnosis not present

## 2014-02-18 DIAGNOSIS — Z79899 Other long term (current) drug therapy: Secondary | ICD-10-CM | POA: Diagnosis not present

## 2014-02-18 DIAGNOSIS — D699 Hemorrhagic condition, unspecified: Secondary | ICD-10-CM | POA: Diagnosis not present

## 2014-02-22 DIAGNOSIS — R972 Elevated prostate specific antigen [PSA]: Secondary | ICD-10-CM | POA: Diagnosis not present

## 2014-02-25 ENCOUNTER — Ambulatory Visit (INDEPENDENT_AMBULATORY_CARE_PROVIDER_SITE_OTHER): Payer: Medicare Other | Admitting: Cardiovascular Disease

## 2014-02-25 ENCOUNTER — Encounter: Payer: Self-pay | Admitting: Cardiovascular Disease

## 2014-02-25 VITALS — BP 159/91 | HR 60 | Ht 72.0 in | Wt 184.0 lb

## 2014-02-25 DIAGNOSIS — I10 Essential (primary) hypertension: Secondary | ICD-10-CM | POA: Diagnosis not present

## 2014-02-25 DIAGNOSIS — I2510 Atherosclerotic heart disease of native coronary artery without angina pectoris: Secondary | ICD-10-CM

## 2014-02-25 DIAGNOSIS — E785 Hyperlipidemia, unspecified: Secondary | ICD-10-CM | POA: Diagnosis not present

## 2014-02-25 MED ORDER — AMLODIPINE BESYLATE 5 MG PO TABS: 5.0000 mg | ORAL_TABLET | Freq: Every day | ORAL | Status: DC

## 2014-02-25 NOTE — Progress Notes (Signed)
    HPI:  79 year old gentleman presenting for followup evaluation. The patient has coronary artery disease with a history of multiple PCI procedures. His most recent PCI procedure was in 2010 when he was treated with complex PCI utilizing 4 stents in the right coronary artery.   The patient remains physically active without symptoms of angina. He jogs on a regular basis. He complains of generalized fatigue, but has no specific complaints. Denies dyspnea, edema, or palpitations. Remains compliant with diet and medical therapy. Notes nuisance bleeding with razor cuts and minor injuries.  Outpatient Encounter Prescriptions as of 02/25/2014  Medication Sig  . aspirin 81 MG tablet Take 81 mg by mouth daily.    . clopidogrel (PLAVIX) 75 MG tablet Take 1 tablet by mouth  daily  . glucosamine-chondroitin 500-400 MG tablet Take 1 tablet by mouth 2 (two) times daily.    . metoprolol (LOPRESSOR) 50 MG tablet 50 MG IN THE AM, 25 MG IN THE PM  . Multiple Vitamin (MULTIVITAMIN) tablet Take 1 tablet by mouth daily.    . nitroGLYCERIN (NITROSTAT) 0.4 MG SL tablet Place 1 tablet (0.4 mg total) under the tongue every 5 (five) minutes as needed.  . simvastatin (ZOCOR) 40 MG tablet Take 40 mg by mouth at bedtime.      Allergies  Allergen Reactions  . Demerol   . Ibuprofen     Past Medical History  Diagnosis Date  . IHD (ischemic heart disease)     Remote PCI to distal RCA and atherectomy of th LAD in 1996; S/P PCI to LCX and RCA in 2010.  Marland Kitchen Hyperlipidemia   . Hypertension   . Renal insufficiency     on Diovan  . Cancer     tonsil  . Hx of cardiovascular stress test     a.  ETT-Myoview 8/14:  No scar or ischemia, EF 52%, normal study;  b.  Echo 8/14:  EF 55-60%, Gr 1 DD    ROS: Negative except as per HPI  BP 159/91  Pulse 60  Ht 6' (1.829 m)  Wt 184 lb (83.462 kg)  BMI 24.95 kg/m2  PHYSICAL EXAM: Pt is alert and oriented, NAD HEENT: normal Neck: JVP - normal, carotids 2+= without  bruits Lungs: CTA bilaterally CV: RRR without murmur or gallop Abd: soft, NT, Positive BS, no hepatomegaly Ext: no C/C/E, distal pulses intact and equal Skin: warm/dry no rash  EKG:  NSR 60 bpm, within normal limits  ASSESSMENT AND PLAN: 1. CAD, native vessel. Has been treated with multiple stents. Doing well without angina. I have recommended that he continue on long-term DAPT and he is agreeable.   2. HTN, suboptimal control. Add amlodipine. Reviewed low-sodium diet.   3. Hyperlipidemia. Labs from Dr Drema Dallas reviewed. Lipids at goal. On simvastatin.  I will see him back in one year for follow-up.  Sherren Mocha 02/28/2014 10:54 PM

## 2014-02-25 NOTE — Patient Instructions (Signed)
Your physician has recommended you make the following change in your medication:  START Amlodipine 5 mg once daily  Your physician wants you to follow-up in: 1 year with Dr. Burt Knack.  You will receive a reminder letter in the mail two months in advance. If you don't receive a letter, please call our office to schedule the follow-up appointment.

## 2014-03-01 DIAGNOSIS — R972 Elevated prostate specific antigen [PSA]: Secondary | ICD-10-CM | POA: Diagnosis not present

## 2014-03-01 DIAGNOSIS — N139 Obstructive and reflux uropathy, unspecified: Secondary | ICD-10-CM | POA: Diagnosis not present

## 2014-03-01 DIAGNOSIS — N401 Enlarged prostate with lower urinary tract symptoms: Secondary | ICD-10-CM | POA: Diagnosis not present

## 2014-03-30 DIAGNOSIS — M67919 Unspecified disorder of synovium and tendon, unspecified shoulder: Secondary | ICD-10-CM | POA: Diagnosis not present

## 2014-03-30 DIAGNOSIS — M719 Bursopathy, unspecified: Secondary | ICD-10-CM | POA: Diagnosis not present

## 2014-04-01 DIAGNOSIS — N183 Chronic kidney disease, stage 3 unspecified: Secondary | ICD-10-CM | POA: Diagnosis not present

## 2014-04-01 DIAGNOSIS — M25519 Pain in unspecified shoulder: Secondary | ICD-10-CM | POA: Diagnosis not present

## 2014-04-01 DIAGNOSIS — E7800 Pure hypercholesterolemia, unspecified: Secondary | ICD-10-CM | POA: Diagnosis not present

## 2014-04-01 DIAGNOSIS — I10 Essential (primary) hypertension: Secondary | ICD-10-CM | POA: Diagnosis not present

## 2014-04-01 DIAGNOSIS — Z1331 Encounter for screening for depression: Secondary | ICD-10-CM | POA: Diagnosis not present

## 2014-04-01 DIAGNOSIS — H6590 Unspecified nonsuppurative otitis media, unspecified ear: Secondary | ICD-10-CM | POA: Diagnosis not present

## 2014-04-01 DIAGNOSIS — R21 Rash and other nonspecific skin eruption: Secondary | ICD-10-CM | POA: Diagnosis not present

## 2014-06-06 ENCOUNTER — Other Ambulatory Visit: Payer: Self-pay | Admitting: *Deleted

## 2014-06-06 MED ORDER — CLOPIDOGREL BISULFATE 75 MG PO TABS: ORAL_TABLET | ORAL | Status: DC

## 2014-10-03 DIAGNOSIS — H35013 Changes in retinal vascular appearance, bilateral: Secondary | ICD-10-CM | POA: Diagnosis not present

## 2014-11-09 ENCOUNTER — Other Ambulatory Visit: Payer: Self-pay | Admitting: Family Medicine

## 2014-11-09 ENCOUNTER — Ambulatory Visit
Admission: RE | Admit: 2014-11-09 | Discharge: 2014-11-09 | Disposition: A | Discharge status: Home / Self Care | Payer: Medicare Other | Source: Ambulatory Visit | Attending: Family Medicine | Admitting: Family Medicine

## 2014-11-09 DIAGNOSIS — E780 Pure hypercholesterolemia: Secondary | ICD-10-CM | POA: Diagnosis not present

## 2014-11-09 DIAGNOSIS — Z23 Encounter for immunization: Secondary | ICD-10-CM | POA: Diagnosis not present

## 2014-11-09 DIAGNOSIS — R519 Headache, unspecified: Secondary | ICD-10-CM

## 2014-11-09 DIAGNOSIS — R51 Headache: Secondary | ICD-10-CM | POA: Diagnosis not present

## 2014-11-09 DIAGNOSIS — S2232XA Fracture of one rib, left side, initial encounter for closed fracture: Secondary | ICD-10-CM | POA: Diagnosis not present

## 2014-11-09 DIAGNOSIS — R0789 Other chest pain: Secondary | ICD-10-CM

## 2014-11-09 DIAGNOSIS — S0990XA Unspecified injury of head, initial encounter: Secondary | ICD-10-CM | POA: Diagnosis not present

## 2014-11-09 DIAGNOSIS — Z8589 Personal history of malignant neoplasm of other organs and systems: Secondary | ICD-10-CM | POA: Diagnosis not present

## 2014-11-09 DIAGNOSIS — I10 Essential (primary) hypertension: Secondary | ICD-10-CM | POA: Diagnosis not present

## 2014-11-09 DIAGNOSIS — N183 Chronic kidney disease, stage 3 (moderate): Secondary | ICD-10-CM | POA: Diagnosis not present

## 2015-02-23 ENCOUNTER — Encounter: Payer: Self-pay | Admitting: Cardiovascular Disease

## 2015-02-23 ENCOUNTER — Ambulatory Visit (INDEPENDENT_AMBULATORY_CARE_PROVIDER_SITE_OTHER): Payer: Medicare Other | Admitting: Cardiovascular Disease

## 2015-02-23 VITALS — BP 122/78 | HR 60 | Ht 72.0 in | Wt 187.0 lb

## 2015-02-23 DIAGNOSIS — I10 Essential (primary) hypertension: Secondary | ICD-10-CM | POA: Diagnosis not present

## 2015-02-23 DIAGNOSIS — I2510 Atherosclerotic heart disease of native coronary artery without angina pectoris: Secondary | ICD-10-CM | POA: Diagnosis not present

## 2015-02-23 DIAGNOSIS — E785 Hyperlipidemia, unspecified: Secondary | ICD-10-CM | POA: Diagnosis not present

## 2015-02-23 MED ORDER — CLOPIDOGREL BISULFATE 75 MG PO TABS: ORAL_TABLET | ORAL | Status: DC

## 2015-02-23 MED ORDER — AMLODIPINE BESYLATE 5 MG PO TABS: 5.0000 mg | ORAL_TABLET | Freq: Every day | ORAL | Status: DC

## 2015-02-23 NOTE — Patient Instructions (Signed)
Your physician wants you to follow-up in: 1 YEAR with Dr Cooper.  You will receive a reminder letter in the mail two months in advance. If you don't receive a letter, please call our office to schedule the follow-up appointment.  Your physician recommends that you continue on your current medications as directed. Please refer to the Current Medication list given to you today.  

## 2015-02-23 NOTE — Progress Notes (Signed)
Cardiology Office Note   Date:  02/23/2015   ID:  EVELYN MOCH, DOB Apr 17, 1935, MRN [id number]  PCP:  Gerrit Heck, MD  Cardiologist:  Sherren Mocha, MD    Chief Complaint  Patient presents with  . Coronary Artery Disease     History of Present Illness: Jose Frank is a 80 y.o. male who presents for follow-up of CAD.  The patient has coronary artery disease with a history of multiple PCI procedures. His most recent PCI procedure was in 2010 when he was treated with complex PCI utilizing 4 stents in the right coronary artery.   He feels well. He denies chest pain or shortness of breath. He's active and continues to exercise regularly, jogging 5 miles every other day. Also does 100 sit-ups and 35 push-ups every other day.  Past Medical History  Diagnosis Date  . IHD (ischemic heart disease)     Remote PCI to distal RCA and atherectomy of th LAD in 1996; S/P PCI to LCX and RCA in 2010.  Marland Kitchen Hyperlipidemia   . Hypertension   . Renal insufficiency     on Diovan  . Cancer     tonsil  . Hx of cardiovascular stress test     a.  ETT-Myoview 8/14:  No scar or ischemia, EF 52%, normal study;  b.  Echo 8/14:  EF 55-60%, Gr 1 DD    Past Surgical History  Procedure Laterality Date  . Cardiac catheterization  09/13/2009  . Cardiac catheterization  09/04/2009    EF 60%  . Tonsillectomy    . Pci  08/2009    TO THE LCX AND RCA  . Cardiovascular stress test  12/08/2007    EF 57%    Current Outpatient Prescriptions  Medication Sig Dispense Refill  . amLODipine (NORVASC) 5 MG tablet Take 1 tablet (5 mg total) by mouth daily. 90 tablet 3  . aspirin 81 MG tablet Take 81 mg by mouth daily.      . clopidogrel (PLAVIX) 75 MG tablet Take 1 tablet by mouth  daily 90 tablet 3  . CRESTOR 10 MG tablet Take 1 tablet by mouth every evening.    Marland Kitchen GLUCOSAMINE-CHONDROITIN PO Take 1 tablet by mouth 2 (two) times daily. (1500 mg/1200 mg)    . metoprolol (LOPRESSOR) 50 MG tablet TAKE 50  MG BY MOUTH IN THE AM AND TAKE 25 MG BY MOUTH IN THE PM    . Multiple Vitamin (MULTIVITAMIN) tablet Take 1 tablet by mouth daily.      . nitroGLYCERIN (NITROSTAT) 0.4 MG SL tablet Place 1 tablet (0.4 mg total) under the tongue every 5 (five) minutes as needed. (Patient taking differently: Place 0.4 mg under the tongue every 5 (five) minutes as needed for chest pain (MAX 3 TABLETS). ) 25 tablet 2  . tamsulosin (FLOMAX) 0.4 MG CAPS capsule Take 1 capsule by mouth every evening.  11   No current facility-administered medications for this visit.    Allergies:   Demerol   Social History:  The patient  reports that he has never smoked. He does not have any smokeless tobacco history on file. He reports that he drinks alcohol. He reports that he does not use illicit drugs.   Family History:  The patient's  family history includes Heart disease in his father; Stroke in his mother.    ROS:  Please see the history of present illness.  Otherwise, review of systems is positive for back pain and frequent urination.  All other systems are reviewed and negative.    PHYSICAL EXAM: VS:  BP 122/78 mmHg  Pulse 60  Ht 6' (1.829 m)  Wt 187 lb (84.823 kg)  BMI 25.36 kg/m2 , BMI Body mass index is 25.36 kg/(m^2). GEN: Well nourished, well developed, in no acute distress HEENT: normal Neck: no JVD, no masses. No carotid bruits Cardiac: RRR without murmur or gallop                Respiratory:  clear to auscultation bilaterally, normal work of breathing GI: soft, nontender, nondistended, + BS MS: no deformity or atrophy Ext: no pretibial edema, pedal pulses 2+= bilaterally Skin: warm and dry, no rash Neuro:  Strength and sensation are intact Psych: euthymic mood, full affect  EKG:  EKG is ordered today. The ekg ordered today shows normal sinus rhythm 60 bpm, within normal limits.  Recent Labs: No results found for requested labs within last 365 days.   Lipid Panel     Component Value Date/Time    CHOL 160 07/29/2011 0918   TRIG 99.0 07/29/2011 0918   HDL 61.30 07/29/2011 0918   CHOLHDL 3 07/29/2011 0918   VLDL 19.8 07/29/2011 0918   LDLCALC 79 07/29/2011 0918      Wt Readings from Last 3 Encounters:  02/23/15 187 lb (84.823 kg)  02/25/14 184 lb (83.462 kg)  07/14/13 182 lb (82.555 kg)     Cardiac Studies Reviewed: Myoview scan 07/14/2013: Impression Exercise Capacity: Fair exercise capacity. BP Response: Hypertensive blood pressure response. Clinical Symptoms: Fatigue, no chest pain.  ECG Impression: No significant ST segment change suggestive of ischemia. Comparison with Prior Nuclear Study: No images to compare  Overall Impression: Normal stress nuclear study except low normal to mildly decreased LV systolic function.   LV Ejection Fraction: 52%. LV Wall Motion: Mild global hypokinesis.   2-D echocardiogram 07/23/2013: Study Conclusions  Left ventricle: The cavity size was normal. Wall thickness was normal. Systolic function was normal. The estimated ejection fraction was in the range of 55% to 60%. Doppler parameters are consistent with abnormal left ventricular relaxation (grade 1 diastolic dysfunction).  ASSESSMENT AND PLAN: 1.  CAD, native vessel: The patient is doing very well with no anginal symptoms. His medical program is stable. He continues on long-term dual antiplatelet therapy after multiple stents. He's had no bleeding problems. He has an excellent exercise. He will return in one year for follow-up evaluation. His last echocardiogram and nuclear scan were reviewed today.  2. Essential hypertension: Blood pressure is well controlled. Amlodipine was added last year and he states his blood pressure is always in range. No change made today.  3. Hyperlipidemia: The patient is treated with Crestor. Lipids are followed by his PCP.   Current medicines are reviewed with the patient today.  The patient does not have concerns regarding medicines.  The  following changes have been made:  no change  Labs/ tests ordered today include:   Orders Placed This Encounter  Procedures  . EKG 12-Lead    Disposition:   FU one year  Signed, Sherren Mocha, MD  02/23/2015 [facility] [place], [place], [place]  [address] Phone: [phone]; Fax: [phone]

## 2015-02-28 DIAGNOSIS — R972 Elevated prostate specific antigen [PSA]: Secondary | ICD-10-CM | POA: Diagnosis not present

## 2015-02-28 DIAGNOSIS — Z125 Encounter for screening for malignant neoplasm of prostate: Secondary | ICD-10-CM | POA: Diagnosis not present

## 2015-02-28 DIAGNOSIS — N401 Enlarged prostate with lower urinary tract symptoms: Secondary | ICD-10-CM | POA: Diagnosis not present

## 2015-03-07 DIAGNOSIS — R972 Elevated prostate specific antigen [PSA]: Secondary | ICD-10-CM | POA: Diagnosis not present

## 2015-03-07 DIAGNOSIS — R351 Nocturia: Secondary | ICD-10-CM | POA: Diagnosis not present

## 2015-04-05 DIAGNOSIS — I10 Essential (primary) hypertension: Secondary | ICD-10-CM | POA: Diagnosis not present

## 2015-04-05 DIAGNOSIS — N183 Chronic kidney disease, stage 3 (moderate): Secondary | ICD-10-CM | POA: Diagnosis not present

## 2015-04-05 DIAGNOSIS — E780 Pure hypercholesterolemia: Secondary | ICD-10-CM | POA: Diagnosis not present

## 2015-04-05 DIAGNOSIS — N400 Enlarged prostate without lower urinary tract symptoms: Secondary | ICD-10-CM | POA: Diagnosis not present

## 2015-04-05 DIAGNOSIS — L989 Disorder of the skin and subcutaneous tissue, unspecified: Secondary | ICD-10-CM | POA: Diagnosis not present

## 2015-04-13 DIAGNOSIS — D225 Melanocytic nevi of trunk: Secondary | ICD-10-CM | POA: Diagnosis not present

## 2015-04-13 DIAGNOSIS — L820 Inflamed seborrheic keratosis: Secondary | ICD-10-CM | POA: Diagnosis not present

## 2015-06-16 DIAGNOSIS — R358 Other polyuria: Secondary | ICD-10-CM | POA: Diagnosis not present

## 2015-06-16 DIAGNOSIS — J40 Bronchitis, not specified as acute or chronic: Secondary | ICD-10-CM | POA: Diagnosis not present

## 2015-06-16 DIAGNOSIS — R509 Fever, unspecified: Secondary | ICD-10-CM | POA: Diagnosis not present

## 2015-06-16 DIAGNOSIS — J029 Acute pharyngitis, unspecified: Secondary | ICD-10-CM | POA: Diagnosis not present

## 2015-06-16 DIAGNOSIS — R351 Nocturia: Secondary | ICD-10-CM | POA: Diagnosis not present

## 2015-06-16 DIAGNOSIS — N400 Enlarged prostate without lower urinary tract symptoms: Secondary | ICD-10-CM | POA: Diagnosis not present

## 2015-06-27 DIAGNOSIS — R05 Cough: Secondary | ICD-10-CM | POA: Diagnosis not present

## 2015-06-27 DIAGNOSIS — L723 Sebaceous cyst: Secondary | ICD-10-CM | POA: Diagnosis not present

## 2015-10-11 ENCOUNTER — Ambulatory Visit: Payer: Medicare Other | Admitting: Nurse Practitioner

## 2015-10-11 ENCOUNTER — Encounter: Payer: Self-pay | Admitting: Physician Assistant

## 2015-10-11 ENCOUNTER — Ambulatory Visit (INDEPENDENT_AMBULATORY_CARE_PROVIDER_SITE_OTHER): Payer: Medicare Other | Admitting: Physician Assistant

## 2015-10-11 VITALS — BP 140/90 | HR 59 | Ht 72.0 in | Wt 182.8 lb

## 2015-10-11 DIAGNOSIS — R0789 Other chest pain: Secondary | ICD-10-CM | POA: Diagnosis not present

## 2015-10-11 DIAGNOSIS — I10 Essential (primary) hypertension: Secondary | ICD-10-CM | POA: Diagnosis not present

## 2015-10-11 DIAGNOSIS — R079 Chest pain, unspecified: Secondary | ICD-10-CM

## 2015-10-11 DIAGNOSIS — I2510 Atherosclerotic heart disease of native coronary artery without angina pectoris: Secondary | ICD-10-CM

## 2015-10-11 DIAGNOSIS — E785 Hyperlipidemia, unspecified: Secondary | ICD-10-CM

## 2015-10-11 NOTE — Progress Notes (Signed)
Cardiology Office Note   Date:  10/11/2015   ID:  Jose Frank, DOB 1935/07/25, MRN [id number]  PCP:  Gerrit Heck, MD  Cardiologist:  Dr. Sherren Mocha   Electrophysiologist:  n/a  Chief Complaint  Patient presents with  . Chest Pain     History of Present Illness: Jose Frank is a 80 y.o. male with a hx of CAD s/p multiple PCI procedures.  Last PCI in 2010 with DES to the LCx and DES x 4 to the proximal, mid and distal RCA.  Other hx includes HTN, HL, CKD.  Last seen by Dr. Sherren Mocha in 3/16.   Presents for evaluation of left shoulder and left chest discomfort. This has been ongoing for the last month. He runs every other day without symptoms. He can do yard work without symptoms. However, when he lays down in bed at night, he will develop left shoulder and left chest discomfort. He can feel it with certain positions as well. He denies associated dyspnea, nausea, diaphoresis. Denies syncope. Denies orthopnea, PND or edema. Denies decreased exercise tolerance.   Studies/Reports Reviewed Today:  Echo 8/14 EF 81-82%, Gr 1 diastolic dysfunction, Ao root 40 mm  Myoview 8/14 EF 52%, no scar or ischemia  LHC 08/2009  CFX 95% RCA proximal 90%, distal 95% LAD proximal 40%,  EF 60%, normal wall motion.  PCI: Xience DES to the CFX.  Staged PCI: Xience DES x4 to the proximal, mid and distal RCA.   Past Medical History  Diagnosis Date  . IHD (ischemic heart disease)     Remote PCI to distal RCA and atherectomy of th LAD in 1996; S/P PCI to LCX and RCA in 2010.  Marland Kitchen Hyperlipidemia   . Hypertension   . Renal insufficiency     on Diovan  . Cancer (HCC)     tonsil  . Hx of cardiovascular stress test     a.  ETT-Myoview 8/14:  No scar or ischemia, EF 52%, normal study;  b.  Echo 8/14:  EF 55-60%, Gr 1 DD    Past Surgical History  Procedure Laterality Date  . Cardiac catheterization  09/13/2009  . Cardiac catheterization  09/04/2009    EF 60%  .  Tonsillectomy    . Pci  08/2009    TO THE LCX AND RCA  . Cardiovascular stress test  12/08/2007    EF 57%     Current Outpatient Prescriptions  Medication Sig Dispense Refill  . amLODipine (NORVASC) 5 MG tablet Take 1 tablet (5 mg total) by mouth daily. 90 tablet 3  . aspirin 81 MG tablet Take 81 mg by mouth daily.      . clopidogrel (PLAVIX) 75 MG tablet Take 1 tablet by mouth  daily 90 tablet 3  . CRESTOR 10 MG tablet Take 1 tablet by mouth every evening.    Marland Kitchen GLUCOSAMINE-CHONDROITIN PO Take 1 tablet by mouth 2 (two) times daily. (1500 mg/1200 mg)    . metoprolol (LOPRESSOR) 50 MG tablet TAKE 50 MG BY MOUTH IN THE AM AND TAKE 25 MG BY MOUTH IN THE PM    . Multiple Vitamin (MULTIVITAMIN) tablet Take 1 tablet by mouth daily.      . nitroGLYCERIN (NITROSTAT) 0.4 MG SL tablet Place 1 tablet (0.4 mg total) under the tongue every 5 (five) minutes as needed. (Patient taking differently: Place 0.4 mg under the tongue every 5 (five) minutes as needed for chest pain (MAX 3 TABLETS). ) 25 tablet 2  .  tamsulosin (FLOMAX) 0.4 MG CAPS capsule Take 1 capsule by mouth every evening.  11   No current facility-administered medications for this visit.    Allergies:   Demerol    Social History:   Social History   Social History  . Marital Status: Married    Spouse Name: N/A  . Number of Children: N/A  . Years of Education: N/A   Occupational History  . Retired     Worked in [employer] with [place] Topics  . Smoking status: Never Smoker   . Smokeless tobacco: None  . Alcohol Use: Yes     Comment: occasional  . Drug Use: No  . Sexual Activity: Not Asked   Other Topics Concern  . 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 Stroke Mother   . Heart disease Father       ROS:   Please see the history of present illness.    Review of Systems  All other systems reviewed and are negative.     PHYSICAL EXAM: VS:  BP 140/90 mmHg  Pulse 59  Ht 6' (1.829 m)  Wt 182 lb 12.8 oz (82.918 kg)  BMI 24.79 kg/m2    Wt Readings from Last 3 Encounters:  10/11/15 182 lb 12.8 oz (82.918 kg)  02/23/15 187 lb (84.823 kg)  02/25/14 184 lb (83.462 kg)     GEN: Well nourished, well developed, in no acute distress HEENT: normal Neck: no JVD,   no masses Cardiac:  Normal S1/S2, RRR; no murmur ,  no rubs or gallops, no edema   Respiratory:  clear to auscultation bilaterally, no wheezing, rhonchi or rales. GI: soft, nontender, nondistended, + BS MS: no deformity or atrophy; mild crepitus noted with PROM L shoulder; "empty can test" ? positive Skin: warm and dry  Neuro:  CNs II-XII intact, Strength and sensation are intact Psych: Normal affect   EKG:  EKG is ordered today.  It demonstrates:   Sinus brady, HR 59, normal axis, QTc 439 ms, no change since prior tracing   Recent Labs: No results found for requested labs within last 365 days.    Lipid Panel    Component Value Date/Time   CHOL 160 07/29/2011 0918   TRIG 99.0 07/29/2011 0918   HDL 61.30 07/29/2011 0918   CHOLHDL 3 07/29/2011 0918   VLDL 19.8 07/29/2011 0918   LDLCALC 79 07/29/2011 0918      ASSESSMENT AND PLAN:  1. Chest Pain:  He has L shoulder and L chest pain mainly at night when he lays down.  Denies exertional symptoms or symptoms like what he had prior to his PCI. He remains active without limitations.  His ECG is normal. It has been 2 years since his last stress test and he has had multiple PCIs in the past.  He has had fairly atypical symptoms in the past with progressive CAD.  I will arrange an ETT-Myoview.  If this is low risk, I would suggest he FU with his PCP to evaluate for L shoulder DJD or RTC pathology as a cause.  2. CAD:  He is s/p multiple PCI procedures in the past.  Proceed with stress testing as noted.  Continue ASA, Plavix,  statin, beta-blocker, Amlodipine.   3. HTN:  Borderline BP  today.  He states his BP at home is 120/80.  Continue current therapy and continue to monitor.    3. Hyperlipidemia:  Continue statin. Labs followed by PCP.      Medication Changes: Current medicines are reviewed at length with the patient today.  Concerns regarding medicines are as outlined above.  The following changes have been made:   Discontinued Medications   No medications on file   Modified Medications   No medications on file   New Prescriptions   No medications on file   Labs/ tests ordered today include:   Orders Placed This Encounter  Procedures  . Myocardial Perfusion Imaging  . EKG 12-Lead      Disposition:    FU with Dr. Sherren Mocha 3/17 or sooner if Myoview abnormal or symptoms worsen.     Signed, Versie Starks, MHS 10/11/2015 3:41 PM    [facility] [place], [place], [place]  [address] Phone: [phone]; Fax: [phone]

## 2015-10-11 NOTE — Patient Instructions (Signed)
Medication Instructions:  1. Your physician recommends that you continue on your current medications as directed. Please refer to the Current Medication list given to you today.   Labwork: NONE  Testing/Procedures: Your physician has requested that you have en exercise stress myoview. For further information please visit [email or web address]. Please follow instruction sheet, as given.   Follow-Up: DR. Burt Knack 02/2016; WE WILL CALL A COUPLE OF MONTHS EARLIER TO MAKE APPT  Any Other Special Instructions Will Be Listed Below (If Applicable).     If you need a refill on your cardiac medications before your next appointment, please call your pharmacy.

## 2015-10-16 ENCOUNTER — Telehealth (HOSPITAL_COMMUNITY): Payer: Self-pay

## 2015-10-16 NOTE — Telephone Encounter (Signed)
Patient given detailed instructions per Myocardial Perfusion Study Information Sheet for the test on 10-18-2015 at 0730. Patient notified to arrive 15 minutes early and that it is imperative to arrive on time for appointment to keep from having the test rescheduled.  If you need to cancel or reschedule your appointment, please call the office within 24 hours of your appointment. Failure to do so may result in a cancellation of your appointment, and a $50 no show fee. Patient verbalized understanding.Oletta Lamas, Treyon Wymore A

## 2015-10-18 ENCOUNTER — Ambulatory Visit (HOSPITAL_COMMUNITY): Payer: Medicare Other | Attending: Physician Assistant

## 2015-10-18 DIAGNOSIS — I10 Essential (primary) hypertension: Secondary | ICD-10-CM | POA: Diagnosis not present

## 2015-10-18 DIAGNOSIS — R0789 Other chest pain: Secondary | ICD-10-CM | POA: Diagnosis not present

## 2015-10-18 LAB — MYOCARDIAL PERFUSION IMAGING
CHL CUP MPHR: 142 {beats}/min
CHL CUP NUCLEAR SRS: 2
CHL CUP NUCLEAR SSS: 2
Estimated workload: 7 METS
Exercise duration (min): 6 min
Exercise duration (sec): 0 s
LVDIAVOL: 127 mL
LVSYSVOL: 57 mL
Peak HR: 129 {beats}/min
Percent HR: 90 %
RATE: 0.34
RPE: 17
Rest HR: 64 {beats}/min
SDS: 0
TID: 0.95

## 2015-10-18 MED ORDER — TECHNETIUM TC 99M SESTAMIBI GENERIC - CARDIOLITE
30.6000 | Freq: Once | INTRAVENOUS | Status: AC | PRN
  Administered 2015-10-18: 31 via INTRAVENOUS

## 2015-10-18 MED ORDER — TECHNETIUM TC 99M SESTAMIBI GENERIC - CARDIOLITE
10.1000 | Freq: Once | INTRAVENOUS | Status: AC | PRN
  Administered 2015-10-18: 10.1 via INTRAVENOUS

## 2015-10-19 ENCOUNTER — Encounter: Payer: Self-pay | Admitting: Physician Assistant

## 2015-10-19 ENCOUNTER — Telehealth: Payer: Self-pay | Admitting: *Deleted

## 2015-10-19 NOTE — Telephone Encounter (Signed)
Pt notified of myoview results by phone with verbal understanding 

## 2015-11-09 DIAGNOSIS — H52223 Regular astigmatism, bilateral: Secondary | ICD-10-CM | POA: Diagnosis not present

## 2015-11-09 DIAGNOSIS — H353130 Nonexudative age-related macular degeneration, bilateral, stage unspecified: Secondary | ICD-10-CM | POA: Diagnosis not present

## 2015-11-09 DIAGNOSIS — H2513 Age-related nuclear cataract, bilateral: Secondary | ICD-10-CM | POA: Diagnosis not present

## 2015-11-09 DIAGNOSIS — H524 Presbyopia: Secondary | ICD-10-CM | POA: Diagnosis not present

## 2016-01-13 ENCOUNTER — Other Ambulatory Visit: Payer: Self-pay | Admitting: Cardiovascular Disease

## 2016-02-29 DIAGNOSIS — R972 Elevated prostate specific antigen [PSA]: Secondary | ICD-10-CM | POA: Diagnosis not present

## 2016-03-06 DIAGNOSIS — R351 Nocturia: Secondary | ICD-10-CM | POA: Diagnosis not present

## 2016-03-06 DIAGNOSIS — Z0000 Encounter for general adult medical examination without abnormal findings: Secondary | ICD-10-CM | POA: Diagnosis not present

## 2016-03-06 DIAGNOSIS — R972 Elevated prostate specific antigen [PSA]: Secondary | ICD-10-CM | POA: Diagnosis not present

## 2016-03-07 ENCOUNTER — Encounter (HOSPITAL_COMMUNITY): Payer: Self-pay

## 2016-03-07 ENCOUNTER — Other Ambulatory Visit: Payer: Self-pay

## 2016-03-07 ENCOUNTER — Emergency Department (HOSPITAL_COMMUNITY)
Admission: EM | Admit: 2016-03-07 | Discharge: 2016-03-07 | Disposition: A | Discharge status: Home / Self Care | Payer: Medicare Other | Attending: Emergency Medicine | Admitting: Emergency Medicine

## 2016-03-07 ENCOUNTER — Emergency Department (HOSPITAL_COMMUNITY): Payer: Medicare Other

## 2016-03-07 DIAGNOSIS — R938 Abnormal findings on diagnostic imaging of other specified body structures: Secondary | ICD-10-CM | POA: Diagnosis not present

## 2016-03-07 DIAGNOSIS — R079 Chest pain, unspecified: Secondary | ICD-10-CM | POA: Insufficient documentation

## 2016-03-07 DIAGNOSIS — Z7982 Long term (current) use of aspirin: Secondary | ICD-10-CM | POA: Diagnosis not present

## 2016-03-07 DIAGNOSIS — Z79899 Other long term (current) drug therapy: Secondary | ICD-10-CM | POA: Diagnosis not present

## 2016-03-07 DIAGNOSIS — E785 Hyperlipidemia, unspecified: Secondary | ICD-10-CM | POA: Insufficient documentation

## 2016-03-07 DIAGNOSIS — R05 Cough: Secondary | ICD-10-CM | POA: Insufficient documentation

## 2016-03-07 DIAGNOSIS — I10 Essential (primary) hypertension: Secondary | ICD-10-CM | POA: Insufficient documentation

## 2016-03-07 DIAGNOSIS — Z87448 Personal history of other diseases of urinary system: Secondary | ICD-10-CM | POA: Insufficient documentation

## 2016-03-07 DIAGNOSIS — R222 Localized swelling, mass and lump, trunk: Secondary | ICD-10-CM | POA: Insufficient documentation

## 2016-03-07 DIAGNOSIS — Z7902 Long term (current) use of antithrombotics/antiplatelets: Secondary | ICD-10-CM | POA: Diagnosis not present

## 2016-03-07 DIAGNOSIS — Z8589 Personal history of malignant neoplasm of other organs and systems: Secondary | ICD-10-CM | POA: Insufficient documentation

## 2016-03-07 DIAGNOSIS — R0602 Shortness of breath: Secondary | ICD-10-CM | POA: Diagnosis present

## 2016-03-07 LAB — CBC WITH DIFFERENTIAL/PLATELET
BASOS ABS: 0 10*3/uL (ref 0.0–0.1)
Basophils Relative: 1 %
EOS PCT: 2 %
Eosinophils Absolute: 0.1 10*3/uL (ref 0.0–0.7)
HCT: 38 % — ABNORMAL LOW (ref 39.0–52.0)
Hemoglobin: 13.2 g/dL (ref 13.0–17.0)
LYMPHS PCT: 32 %
Lymphs Abs: 1.9 10*3/uL (ref 0.7–4.0)
MCH: 31.1 pg (ref 26.0–34.0)
MCHC: 34.7 g/dL (ref 30.0–36.0)
MCV: 89.4 fL (ref 78.0–100.0)
Monocytes Absolute: 0.4 10*3/uL (ref 0.1–1.0)
Monocytes Relative: 7 %
Neutro Abs: 3.6 10*3/uL (ref 1.7–7.7)
Neutrophils Relative %: 58 %
PLATELETS: 267 10*3/uL (ref 150–400)
RBC: 4.25 MIL/uL (ref 4.22–5.81)
RDW: 12.2 % (ref 11.5–15.5)
WBC: 6 10*3/uL (ref 4.0–10.5)

## 2016-03-07 LAB — COMPREHENSIVE METABOLIC PANEL
ALBUMIN: 3.6 g/dL (ref 3.5–5.0)
ALT: 20 U/L (ref 17–63)
ANION GAP: 9 (ref 5–15)
AST: 21 U/L (ref 15–41)
Alkaline Phosphatase: 79 U/L (ref 38–126)
BUN: 23 mg/dL — AB (ref 6–20)
CO2: 26 mmol/L (ref 22–32)
Calcium: 9.2 mg/dL (ref 8.9–10.3)
Chloride: 105 mmol/L (ref 101–111)
Creatinine, Ser: 1.33 mg/dL — ABNORMAL HIGH (ref 0.61–1.24)
GFR calc Af Amer: 57 mL/min — ABNORMAL LOW (ref >60)
GFR calc non Af Amer: 49 mL/min — ABNORMAL LOW (ref >60)
Glucose, Bld: 93 mg/dL (ref 65–99)
POTASSIUM: 4 mmol/L (ref 3.5–5.1)
SODIUM: 140 mmol/L (ref 135–145)
TOTAL PROTEIN: 7 g/dL (ref 6.5–8.1)
Total Bilirubin: 0.7 mg/dL (ref 0.3–1.2)

## 2016-03-07 LAB — TROPONIN I

## 2016-03-07 MED ORDER — SODIUM CHLORIDE 0.9 % IV BOLUS (SEPSIS)
500.0000 mL | Freq: Once | INTRAVENOUS | Status: AC
  Administered 2016-03-07: 500 mL via INTRAVENOUS

## 2016-03-07 MED ORDER — IOHEXOL 350 MG/ML SOLN
100.0000 mL | Freq: Once | INTRAVENOUS | Status: AC | PRN
  Administered 2016-03-07: 100 mL via INTRAVENOUS

## 2016-03-07 NOTE — ED Notes (Signed)
Pt c/o increased pain on deep inhalation.  Alert and oriented x 4.

## 2016-03-07 NOTE — ED Provider Notes (Signed)
CSN: [id number]     Arrival date & time 03/07/16  1350 History   First MD Initiated Contact with Patient 03/07/16 1534     Chief Complaint  Patient presents with  . Shortness of Breath  . Abnormal X-ray   . Cough     Patient is a 81 y.o. male presenting with shortness of breath and cough.  Shortness of Breath Associated symptoms: chest pain and cough   Associated symptoms: no headaches, no rash and no vomiting   Cough Associated symptoms: chest pain and shortness of breath   Associated symptoms: no headaches and no rash   Patient presents with cough and some shortness of breath over the last 2 weeks. Seen by primary care doctor and had chest x-ray that showed abnormality consistent with pleural mass and rib destruction. Some pain in the left chest that he has thought was from coughing. 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 He also has elevated PSA without cancer. He has had a little bit of a cough with minimal clear sputum production. He does not smoke.   Past Medical History  Diagnosis Date  . IHD (ischemic heart disease)     Remote PCI to distal RCA and atherectomy of th LAD in 1996; S/P PCI to LCX and RCA in 2010.  Marland Kitchen Hyperlipidemia   . Hypertension   . Renal insufficiency     on Diovan  . Cancer (HCC)     tonsil  . Hx of cardiovascular stress test     a.  ETT-Myoview 8/14:  No scar or ischemia, EF 52%, normal study;  b. ETT-Myoview:  EF 55%, no ST changes, no ischemia; Normal   . History of echocardiogram     Echo 8/14:  EF 55-60%, Gr 1 DD   Past Surgical History  Procedure Laterality Date  . Cardiac catheterization  09/13/2009  . Cardiac catheterization  09/04/2009    EF 60%  . Tonsillectomy    . Pci  08/2009    TO THE LCX AND RCA  . Cardiovascular stress test  12/08/2007    EF 57%   Family History  Problem Relation Age of Onset  . Stroke Mother   . Heart disease Father    Social History  Substance Use Topics  . Smoking status: Never  Smoker   . Smokeless tobacco: None  . Alcohol Use: Yes     Comment: occasional    Review of Systems  Constitutional: Negative for activity change.  Eyes: Negative for pain.  Respiratory: Positive for cough and shortness of breath. Negative for chest tightness.   Cardiovascular: Positive for chest pain. Negative for leg swelling.  Gastrointestinal: Negative for nausea, vomiting and diarrhea.  Genitourinary: Negative for flank pain.  Musculoskeletal: Negative for back pain and neck stiffness.  Skin: Negative for rash.  Neurological: Negative for weakness, numbness and headaches.  Psychiatric/Behavioral: Negative for behavioral problems.      Allergies  Demerol  Home Medications   Prior to Admission medications   Medication Sig Start Date End Date Taking? Authorizing Provider  amLODipine (NORVASC) 5 MG tablet Take 1 tablet by mouth  daily Patient taking differently: Take 5 mg by mouth  daily 01/15/16  Yes Sherren Mocha, MD  aspirin 81 MG tablet Take 81 mg by mouth daily.     Yes Historical Provider, MD  atorvastatin (LIPITOR) 20 MG tablet Take 20 mg by mouth daily.   Yes Historical Provider, MD  clopidogrel (PLAVIX) 75 MG  tablet Take 1 tablet by mouth  daily Patient taking differently: Take 75 mg by mouth  daily 01/15/16  Yes Sherren Mocha, MD  GLUCOSAMINE-CHONDROITIN PO Take 1 tablet by mouth 2 (two) times daily. (1500 mg/1200 mg)   Yes Historical Provider, MD  metoprolol (LOPRESSOR) 50 MG tablet Take 25-50 mg by mouth 2 (two) times daily. TAKE 50 MG BY MOUTH IN THE AM AND TAKE 25 MG BY MOUTH IN THE PM   Yes Historical Provider, MD  Multiple Vitamin (MULTIVITAMIN) tablet Take 1 tablet by mouth daily.     Yes Historical Provider, MD  nitroGLYCERIN (NITROSTAT) 0.4 MG SL tablet Place 1 tablet (0.4 mg total) under the tongue every 5 (five) minutes as needed. Patient taking differently: Place 0.4 mg under the tongue every 5 (five) minutes as needed for chest pain (MAX 3 TABLETS).   02/19/13  Yes Sherren Mocha, MD  tamsulosin [facility]) 0.4 MG CAPS capsule Take 0.4 mg by mouth every evening.  01/31/15  Yes Historical Provider, MD   BP 156/11 mmHg  Pulse 100  Temp(Src) 97.9 F (36.6 C) (Oral)  Resp 21  SpO2 100% Physical Exam  Constitutional: He appears well-developed.  HENT:  Head: Atraumatic.  Neck: Neck supple.  Cardiovascular: Normal rate.   Pulmonary/Chest: Effort normal. He exhibits tenderness.  Slight tenderness to left lateral chest wall.  Abdominal: Soft. There is no tenderness.  Musculoskeletal: He exhibits no edema.  Neurological: He is alert.  Skin: Skin is warm.    ED Course  Procedures (including critical care time) Labs Review Labs Reviewed  COMPREHENSIVE METABOLIC PANEL - Abnormal; Notable for the following:    BUN 23 (*)    Creatinine, Ser 1.33 (*)    GFR calc non Af Amer 49 (*)    GFR calc Af Amer 57 (*)    All other components within normal limits  CBC WITH DIFFERENTIAL/PLATELET - Abnormal; Notable for the following:    HCT 38.0 (*)    All other components within normal limits  TROPONIN I    Imaging Review Ct Angio Chest Pe W/cm &/or Wo Cm  03/07/2016  CLINICAL DATA:  Shortness of breath. History of tonsil cancer. Abnormal destructive appearance of the left seventh rib on chest radiograph from earlier today. EXAM: CT ANGIOGRAPHY CHEST WITH CONTRAST TECHNIQUE: Multidetector CT imaging of the chest was performed using the standard protocol during bolus administration of intravenous contrast. Multiplanar CT image reconstructions and MIPs were obtained to evaluate the vascular anatomy. CONTRAST:  160mL OMNIPAQUE IOHEXOL 350 MG/ML SOLN COMPARISON:  Chest radiograph from earlier today. FINDINGS: Mediastinum/Nodes: The study is high quality for the evaluation of pulmonary embolism. There are no filling defects in the central, lobar, segmental or subsegmental pulmonary artery branches to suggest acute pulmonary embolism. Ascending thoracic aortic  4.2 cm aneurysm. Normal caliber pulmonary arteries. Normal heart size. No pericardial fluid/thickening. Three-vessel coronary atherosclerosis. No discrete thyroid nodules. Normal size thyroid. Normal esophagus. No pathologically enlarged axillary, mediastinal or hilar lymph nodes. Lungs/Pleura: No pneumothorax. No pleural effusion. Basilar right lower lobe 6 mm pulmonary nodule (series 11/ image 75). No acute consolidative airspace disease, additional significant pulmonary nodules or lung masses. Mild atelectasis in the dependent lower lobes. Upper abdomen: Unremarkable. Musculoskeletal: There is an expansile lytic destructive bone mass in the posterior lateral left seventh rib measuring approximately 5.2 x 2.6 cm, which demonstrates mass-effect on the adjacent left pleural space, and is associated with a nondisplaced pathologic fracture. No additional aggressive appearing focal osseous lesions. Moderate to  severe degenerative changes in the thoracic spine. Review of the MIP images confirms the above findings. IMPRESSION: 1. No pulmonary embolism. 2. Expansile lytic destructive bone mass in the posterolateral left seventh rib with associated nondisplaced pathologic fracture. Differential considerations include bone metastasis or plasmacytoma. Consider correlation with CT-guided biopsy. 3. Solitary 6 mm pulmonary nodule in the basilar right lower lobe, indeterminate. Recommend attention on follow-up chest CT in 3 months. 4. Ascending thoracic aortic 4.2 cm aneurysm. Recommend annual imaging followup by CTA or MRA. This recommendation follows 2010 ACCF/AHA/AATS/ACR/ASA/SCA/SCAI/SIR/STS/SVM Guidelines for the Diagnosis and Management of Patients with Thoracic Aortic Disease. Circulation. 2010; 121: e266-e369 5. Three-vessel coronary atherosclerosis. Electronically Signed   By: Ilona Sorrel M.D.   On: 03/07/2016 19:06   I have personally reviewed and evaluated these images and lab results as part of my medical  decision-making.   EKG Interpretation   Date/Time:  Thursday March 07 2016 14:25:53 EDT Ventricular Rate:  82 PR Interval:  159 QRS Duration: 90 QT Interval:  381 QTC Calculation: 445 R Axis:   51 Text Interpretation:  Sinus rhythm Atrial premature complex Low voltage,  extremity leads Confirmed by Alvino Chapel  MD, Ovid Curd [phone]) on 03/07/2016  4:18:10 PM      MDM   Final diagnoses:  Chest wall mass    Patient with chest wall mass. Likely tumor. Discussed with oncology and patient be followed up as an outpatient through her primary care doctor.    Davonna Belling, MD 03/07/16 2352

## 2016-03-07 NOTE — ED Notes (Addendum)
Pt c/o SOB, L ribcage pain, and cough x 2 weeks.  Pain score 5/10 increases w/ movement.  Pt was seen at PCP today and chest x-ray resulted "a pleural-based mass and obstruction of the left seventh rib."  Pt's wife reports Pt fell last fall and fractured that same rib.  PCP would like to r/o a PE w/ a chest CT.

## 2016-03-07 NOTE — Discharge Instructions (Signed)
Follow up with Dr. Drema Dallas tomorrow to arrange the biopsy.

## 2016-03-12 ENCOUNTER — Other Ambulatory Visit (HOSPITAL_COMMUNITY): Payer: Self-pay | Admitting: Family Medicine

## 2016-03-12 DIAGNOSIS — R911 Solitary pulmonary nodule: Secondary | ICD-10-CM | POA: Diagnosis not present

## 2016-03-12 DIAGNOSIS — R938 Abnormal findings on diagnostic imaging of other specified body structures: Secondary | ICD-10-CM | POA: Diagnosis not present

## 2016-03-12 DIAGNOSIS — I712 Thoracic aortic aneurysm, without rupture: Secondary | ICD-10-CM | POA: Diagnosis not present

## 2016-03-12 DIAGNOSIS — R05 Cough: Secondary | ICD-10-CM | POA: Diagnosis not present

## 2016-03-12 DIAGNOSIS — I2510 Atherosclerotic heart disease of native coronary artery without angina pectoris: Secondary | ICD-10-CM | POA: Diagnosis not present

## 2016-03-12 DIAGNOSIS — R059 Cough, unspecified: Secondary | ICD-10-CM

## 2016-03-13 ENCOUNTER — Telehealth: Payer: Self-pay | Admitting: *Deleted

## 2016-03-13 DIAGNOSIS — R918 Other nonspecific abnormal finding of lung field: Secondary | ICD-10-CM | POA: Insufficient documentation

## 2016-03-13 NOTE — Telephone Encounter (Signed)
Oncology Nurse Navigator Documentation  Oncology Nurse Navigator Flowsheets 03/13/2016  Navigator Location [facility]  Navigator Encounter Type Introductory phone call  Treatment Phase Pre-Tx/Tx Discussion  Barriers/Navigation Needs Coordination of Care  Interventions Coordination of Care  Coordination of Care Appts  Acuity Level 1  Acuity Level 1 Initial guidance, education and coordination as needed  Time Spent with Patient 15   I received referral from Dr. Drema Dallas.  I called to schedule for Jose Frank on 03/21/16.  Patient verbalized understanding of appt time and place.

## 2016-03-15 ENCOUNTER — Encounter: Payer: Self-pay | Admitting: *Deleted

## 2016-03-15 DIAGNOSIS — R918 Other nonspecific abnormal finding of lung field: Secondary | ICD-10-CM

## 2016-03-19 ENCOUNTER — Encounter: Payer: Self-pay | Admitting: *Deleted

## 2016-03-20 ENCOUNTER — Encounter (HOSPITAL_COMMUNITY)
Admission: RE | Admit: 2016-03-20 | Discharge: 2016-03-20 | Disposition: A | Discharge status: Home / Self Care | Payer: Medicare Other | Source: Ambulatory Visit | Attending: Family Medicine | Admitting: Family Medicine

## 2016-03-20 DIAGNOSIS — R05 Cough: Secondary | ICD-10-CM | POA: Insufficient documentation

## 2016-03-20 DIAGNOSIS — M9988 Other biomechanical lesions of rib cage: Secondary | ICD-10-CM | POA: Diagnosis not present

## 2016-03-20 DIAGNOSIS — R059 Cough, unspecified: Secondary | ICD-10-CM

## 2016-03-20 LAB — GLUCOSE, CAPILLARY: Glucose-Capillary: 107 mg/dL — ABNORMAL HIGH (ref 65–99)

## 2016-03-20 MED ORDER — FLUDEOXYGLUCOSE F - 18 (FDG) INJECTION
9.1000 | Freq: Once | INTRAVENOUS | Status: AC | PRN
  Administered 2016-03-20: 9.1 via INTRAVENOUS

## 2016-03-21 ENCOUNTER — Ambulatory Visit (HOSPITAL_BASED_OUTPATIENT_CLINIC_OR_DEPARTMENT_OTHER): Payer: Medicare Other | Admitting: Internal Medicine

## 2016-03-21 ENCOUNTER — Other Ambulatory Visit (HOSPITAL_BASED_OUTPATIENT_CLINIC_OR_DEPARTMENT_OTHER): Payer: Medicare Other

## 2016-03-21 ENCOUNTER — Ambulatory Visit: Payer: Medicare Other | Attending: Internal Medicine | Admitting: Physical Therapy

## 2016-03-21 ENCOUNTER — Ambulatory Visit
Admission: RE | Admit: 2016-03-21 | Discharge: 2016-03-21 | Disposition: A | Discharge status: Home / Self Care | Payer: Medicare Other | Source: Ambulatory Visit | Attending: Radiation Oncology | Admitting: Radiation Oncology

## 2016-03-21 ENCOUNTER — Encounter: Payer: Self-pay | Admitting: Thoracic Surgery (Cardiothoracic Vascular Surgery)

## 2016-03-21 ENCOUNTER — Encounter: Payer: Self-pay | Admitting: *Deleted

## 2016-03-21 ENCOUNTER — Encounter: Payer: Self-pay | Admitting: Internal Medicine

## 2016-03-21 ENCOUNTER — Telehealth: Payer: Self-pay | Admitting: Internal Medicine

## 2016-03-21 ENCOUNTER — Institutional Professional Consult (permissible substitution) (INDEPENDENT_AMBULATORY_CARE_PROVIDER_SITE_OTHER): Payer: Medicare Other | Admitting: Thoracic Surgery (Cardiothoracic Vascular Surgery)

## 2016-03-21 VITALS — BP 146/90 | HR 64 | Temp 98.2°F | Resp 18 | Wt 182.0 lb

## 2016-03-21 VITALS — BP 146/90 | HR 64 | Temp 98.2°F | Resp 18 | Ht 72.0 in | Wt 182.0 lb

## 2016-03-21 DIAGNOSIS — C7951 Secondary malignant neoplasm of bone: Secondary | ICD-10-CM | POA: Diagnosis not present

## 2016-03-21 DIAGNOSIS — R590 Localized enlarged lymph nodes: Secondary | ICD-10-CM | POA: Diagnosis not present

## 2016-03-21 DIAGNOSIS — R293 Abnormal posture: Secondary | ICD-10-CM

## 2016-03-21 DIAGNOSIS — Z9181 History of falling: Secondary | ICD-10-CM | POA: Diagnosis not present

## 2016-03-21 DIAGNOSIS — M899 Disorder of bone, unspecified: Secondary | ICD-10-CM | POA: Diagnosis not present

## 2016-03-21 DIAGNOSIS — R918 Other nonspecific abnormal finding of lung field: Secondary | ICD-10-CM

## 2016-03-21 HISTORY — DX: Localized enlarged lymph nodes: R59.0

## 2016-03-21 LAB — COMPREHENSIVE METABOLIC PANEL
ALT: 13 U/L (ref 0–55)
ANION GAP: 8 meq/L (ref 3–11)
AST: 18 U/L (ref 5–34)
Albumin: 3.7 g/dL (ref 3.5–5.0)
Alkaline Phosphatase: 86 U/L (ref 40–150)
BUN: 21.8 mg/dL (ref 7.0–26.0)
CHLORIDE: 107 meq/L (ref 98–109)
CO2: 26 meq/L (ref 22–29)
CREATININE: 1.6 mg/dL — AB (ref 0.7–1.3)
Calcium: 9.7 mg/dL (ref 8.4–10.4)
EGFR: 39 mL/min/{1.73_m2} — ABNORMAL LOW (ref >90)
GLUCOSE: 100 mg/dL (ref 70–140)
Potassium: 4.8 mEq/L (ref 3.5–5.1)
SODIUM: 142 meq/L (ref 136–145)
Total Bilirubin: 0.38 mg/dL (ref 0.20–1.20)
Total Protein: 7.2 g/dL (ref 6.4–8.3)

## 2016-03-21 LAB — CBC WITH DIFFERENTIAL/PLATELET
BASO%: 1.1 % (ref 0.0–2.0)
BASOS ABS: 0.1 10*3/uL (ref 0.0–0.1)
EOS ABS: 0.2 10*3/uL (ref 0.0–0.5)
EOS%: 4 % (ref 0.0–7.0)
HEMATOCRIT: 41.9 % (ref 38.4–49.9)
HGB: 13.8 g/dL (ref 13.0–17.1)
LYMPH#: 1.6 10*3/uL (ref 0.9–3.3)
LYMPH%: 29.8 % (ref 14.0–49.0)
MCH: 30.9 pg (ref 27.2–33.4)
MCHC: 33 g/dL (ref 32.0–36.0)
MCV: 93.5 fL (ref 79.3–98.0)
MONO#: 0.5 10*3/uL (ref 0.1–0.9)
MONO%: 8.6 % (ref 0.0–14.0)
NEUT%: 56.5 % (ref 39.0–75.0)
NEUTROS ABS: 3.1 10*3/uL (ref 1.5–6.5)
PLATELETS: 190 10*3/uL (ref 140–400)
RBC: 4.48 10*6/uL (ref 4.20–5.82)
RDW: 13.3 % (ref 11.0–14.6)
WBC: 5.5 10*3/uL (ref 4.0–10.3)

## 2016-03-21 NOTE — Progress Notes (Signed)
  Radiation Oncology         [phone]) [phone] ________________________________  Initial Outpatient Consultation - Date: 03/21/2016   Name: Jose Frank MRN: [id number]   DOB: [birth year]  REFERRING PHYSICIAN: Collene Gobble, MD  DIAGNOSIS AND STAGE: No matching staging information was found for the patient.  HISTORY OF PRESENT ILLNESS::Jose Frank is a 81 y.o. male who presented to the Emergency Department 03/07/2016 with complaints of shortness of breath, left ribcage pain, and a cough for two weeks where he was diagnosed with a chest wall mass. He was seen previously that day at his primary care and had a chest x-ray, showing "a pleural-based mass and obstruction of the left seventh rib". He mentions that he fell and fractured his rib a couple years ago. He had a CT of the chest while in the hospital, revealing a solitary 6 mm pulmonary nodule in the basilar right lower lobe and no pulmonary embolism. He had a PET scan 03/20/2016, showing an expansive lytic lesion in the posterolateral aspect of the left seventh rib with associated pathologic fracture demonstrating hypermetabolism and the right submandibular gland which is enlarged and diffusely hypermetabolic.  PREVIOUS RADIATION THERAPY: Yes, he had a tonsillectomy and radiation in 1977.  Past medical, social and family history were reviewed in the electronic chart. Review of symptoms was reviewed in the electronic chart. Medications were reviewed in the electronic chart.   PHYSICAL EXAM: There were no vitals filed for this visit.. .  Vitals with BMI 03/21/2016  Height 6\' 0"   Weight 182 lbs  BMI [id number]  Systolic [id number]  Diastolic 90  Pulse 64  Respirations 18    IMPRESSION: Mr. Packard is a 81 yo gentleman with a lung mass on his left seventh rib and submandibular gland.  PLAN: He will get a biopsy of both the submandibular gland mass and left rib and will get presented again at lung conference for discussion of therapy if malignancy is  confirmed.  I spent 40 minutes  face to face with the patient and more than 50% of that time was spent in counseling and/or coordination of care.   ------------------------------------------------  Thea Silversmith, MD    This document serves as a record of services personally performed by Thea Silversmith, MD. It was created on her behalf by  Lendon Collar, a trained medical scribe. The creation of this record is based on the scribe's personal observations and the provider's statements to them. This document has been checked and approved by the attending provider.

## 2016-03-21 NOTE — Telephone Encounter (Signed)
Gave and printed appt sched and avs for pt for April °

## 2016-03-21 NOTE — Progress Notes (Signed)
Jose Frank Clinical Social Work  Clinical Social Work met with patient/family and Futures trader at [facility] appointment to offer support and assess for psychosocial needs.  Medical oncologist reviewed patient's diagnosis and recommended treatment plan with patient/family.  Patient was accompanied by his wife, Jose Frank, and niece, Jose Frank.  Jose Frank identified Jose Frank and Jose Frank his primary support system and he lives locally in [place]. The patient reported little distress, he reported anxiety determining whether he has cancer, but feels confident in the medical oncologist's plan.  Patient's information concerns were somewhat resolved after meeting with doctor.  ONCBCN DISTRESS SCREENING 03/21/2016  Screening Type Initial Screening  Distress experienced in past week (1-10) 4  Information Concerns Type Lack of info about diagnosis    Clinical Social Work briefly discussed Clinical Social Work role and [name] support programs/services.  Clinical Social Work encouraged patient to call with any additional questions or concerns.   [name], MSW, LCSW, OSW-C Clinical Social Worker [facility] [phone]

## 2016-03-21 NOTE — Progress Notes (Signed)
PCP is Gerrit Heck, MD Referring Provider is Leighton Ruff, MD  No chief complaint on file.   HPI: 81 yo man sent for consultation regarding a left rib mass.  Mr. 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 Other significant issues include CAD, hyperlipdemia, hypertension, and CKD. About 3 or 4 weeks ago he developed a cough and congestion. He also noted some shortness of breath and left chest/ flank pain. He went to see Dr. Drema Dallas. A CXR showed an abnormality of the left 7th rib. A CT showed a lytic bone lesion with a pathologic fracture. There also was a 6 mm pulmonary nodule and a 4.2 cm ascending aneurysm.  A PET showed the rib lesion was hypermetabolic. There also was a hypermetabolic mass noted in the right submandibular area.   Interestingly, he is not having any pain at present. He denies any chest pain, pressure or tightness. Denies fever or chills, weight loss or change in appetite.  Zubrod Score: At the time of surgery this patient's most appropriate activity status/level should be described as: [x]     0    Normal activity, no symptoms []     1    Restricted in physical strenuous activity but ambulatory, able to do out light work []     2    Ambulatory and capable of self care, unable to do work activities, up and about >50 % of waking hours                              []     3    Only limited self care, in bed greater than 50% of waking hours []     4    Completely disabled, no self care, confined to bed or chair []     5    Moribund    Past Medical History  Diagnosis Date  . IHD (ischemic heart disease)     Remote PCI to distal RCA and atherectomy of th LAD in 1996; S/P PCI to LCX and RCA in 2010.  Marland Kitchen Hyperlipidemia   . Hypertension   . Renal insufficiency     on Diovan  . Hx of cardiovascular stress test     a.  ETT-Myoview 8/14:  No scar or ischemia, EF 52%, normal study;  b. ETT-Myoview:   EF 55%, no ST changes, no ischemia; Normal   . History of echocardiogram     Echo 8/14:  EF 55-60%, Gr 1 DD  . Submandibular lymphadenopathy 03/21/2016  . Cancer ([facility]) 1977    tonsil    Past Surgical History  Procedure Laterality Date  . Cardiac catheterization  09/13/2009  . Cardiac catheterization  09/04/2009    EF 60%  . Tonsillectomy    . Pci  08/2009    TO THE LCX AND RCA  . Cardiovascular stress test  12/08/2007    EF 57%    Family History  Problem Relation Age of Onset  . Stroke Mother   . Heart disease Father     Social History Social History  Substance Use Topics  . Smoking status: Never Smoker   . Smokeless tobacco: None  . Alcohol Use: Yes     Comment: occasional    Current Outpatient Prescriptions  Medication Sig Dispense Refill  . amLODipine (NORVASC) 5 MG tablet Take 1 tablet by mouth  daily (Patient taking differently: Take 5  mg by mouth  daily) 90 tablet 1  . aspirin 81 MG tablet Take 81 mg by mouth daily.      . clopidogrel (PLAVIX) 75 MG tablet Take 1 tablet by mouth  daily (Patient taking differently: Take 75 mg by mouth  daily) 90 tablet 1  . diphenhydramine-acetaminophen (TYLENOL PM) 25-500 MG TABS tablet Take 1 tablet by mouth at bedtime as needed.    Marland Kitchen GLUCOSAMINE-CHONDROITIN PO Take 1 tablet by mouth 2 (two) times daily. (1500 mg/1200 mg)    . metoprolol (LOPRESSOR) 50 MG tablet Take 25-50 mg by mouth 2 (two) times daily. TAKE 50 MG BY MOUTH IN THE AM AND TAKE 25 MG BY MOUTH IN THE PM    . Multiple Vitamin (MULTIVITAMIN) tablet Take 1 tablet by mouth daily.      . nitroGLYCERIN (NITROSTAT) 0.4 MG SL tablet Place 1 tablet (0.4 mg total) under the tongue every 5 (five) minutes as needed. (Patient not taking: Reported on 03/21/2016) 25 tablet 2  . rosuvastatin (CRESTOR) 10 MG tablet Take 10 mg by mouth daily.    . tamsulosin (FLOMAX) 0.4 MG CAPS capsule Take 0.4 mg by mouth every evening.   11   No current facility-administered medications for this  visit.    Allergies  Allergen Reactions  . Demerol     Facial swelling     Review of Systems  Constitutional: Negative for fever, chills, activity change, fatigue and unexpected weight change.  HENT: Positive for congestion, dental problem (dentures) and hearing loss. Negative for trouble swallowing and voice change.   Eyes: Negative for visual disturbance.  Respiratory: Positive for cough (nonproductive) and shortness of breath (wih heavy exertion).        Left flank pain now resolved  Cardiovascular: Negative for chest pain.  Gastrointestinal: Negative for abdominal pain and blood in stool.  Genitourinary: Positive for frequency. Negative for hematuria.  Musculoskeletal: Positive for joint swelling and arthralgias.  Neurological: Negative for speech difficulty.  Hematological: Positive for adenopathy (mass under right jaw "been there a long time"). Does not bruise/bleed easily.  All other systems reviewed and are negative.   BP 146/90 mmHg  Pulse 64  Temp(Src) 98.2 F (36.8 C)  Resp 18  Wt 182 lb (82.555 kg)  SpO2 99% Physical Exam  Constitutional: He is oriented to person, place, and time. He appears well-developed and well-nourished. No distress.  HENT:  Head: Normocephalic and atraumatic.  Mouth/Throat: No oropharyngeal exudate.  Firm, fixed palpable 3 cm mass right submandibular area  Eyes: Conjunctivae and EOM are normal. Pupils are equal, round, and reactive to light. No scleral icterus.  Neck: Neck supple. No thyromegaly present.  Cardiovascular: Normal rate, regular rhythm, normal heart sounds and intact distal pulses.   No murmur heard. Pulmonary/Chest: Effort normal and breath sounds normal. No respiratory distress. He has no wheezes. He has no rales.  Abdominal: Soft. Bowel sounds are normal. He exhibits no distension. There is no tenderness.  Musculoskeletal: He exhibits no edema.  Lymphadenopathy:    He has no cervical adenopathy.  Neurological: He is  alert and oriented to person, place, and time. No cranial nerve deficit.  Good strength all 4 ext  Skin: Skin is warm and dry.  Vitals reviewed.    Diagnostic Tests: CT ANGIOGRAPHY CHEST WITH CONTRAST  TECHNIQUE: Multidetector CT imaging of the chest was performed using the standard protocol during bolus administration of intravenous contrast. Multiplanar CT image reconstructions and MIPs were obtained to evaluate the vascular anatomy.  CONTRAST: 127mL OMNIPAQUE IOHEXOL 350 MG/ML SOLN  COMPARISON: Chest radiograph from earlier today.  FINDINGS: Mediastinum/Nodes: The study is high quality for the evaluation of pulmonary embolism. There are no filling defects in the central, lobar, segmental or subsegmental pulmonary artery branches to suggest acute pulmonary embolism. Ascending thoracic aortic 4.2 cm aneurysm. Normal caliber pulmonary arteries. Normal heart size. No pericardial fluid/thickening. Three-vessel coronary atherosclerosis. No discrete thyroid nodules. Normal size thyroid. Normal esophagus. No pathologically enlarged axillary, mediastinal or hilar lymph nodes.  Lungs/Pleura: No pneumothorax. No pleural effusion. Basilar right lower lobe 6 mm pulmonary nodule (series 11/ image 75). No acute consolidative airspace disease, additional significant pulmonary nodules or lung masses. Mild atelectasis in the dependent lower lobes.  Upper abdomen: Unremarkable.  Musculoskeletal: There is an expansile lytic destructive bone mass in the posterior lateral left seventh rib measuring approximately 5.2 x 2.6 cm, which demonstrates mass-effect on the adjacent left pleural space, and is associated with a nondisplaced pathologic fracture. No additional aggressive appearing focal osseous lesions. Moderate to severe degenerative changes in the thoracic spine.  Review of the MIP images confirms the above findings.  IMPRESSION: 1. No pulmonary embolism. 2. Expansile  lytic destructive bone mass in the posterolateral left seventh rib with associated nondisplaced pathologic fracture. Differential considerations include bone metastasis or plasmacytoma. Consider correlation with CT-guided biopsy. 3. Solitary 6 mm pulmonary nodule in the basilar right lower lobe, indeterminate. Recommend attention on follow-up chest CT in 3 months. 4. Ascending thoracic aortic 4.2 cm aneurysm. Recommend annual imaging followup by CTA or MRA. This recommendation follows 2010 ACCF/AHA/AATS/ACR/ASA/SCA/SCAI/SIR/STS/SVM Guidelines for the Diagnosis and Management of Patients with Thoracic Aortic Disease. Circulation. 2010; 121: [id number] 5. Three-vessel coronary atherosclerosis.   Electronically Signed  By: Ilona Sorrel M.D.  On: 03/07/2016 19:06 NUCLEAR MEDICINE PET SKULL BASE TO THIGH  TECHNIQUE: 9.1 mCi F-18 FDG was injected intravenously. Full-ring PET imaging was performed from the skull base to thigh after the radiotracer. CT data was obtained and used for attenuation correction and anatomic localization.  FASTING BLOOD GLUCOSE: Value: 107 mg/dl  COMPARISON: Chest CT 03/07/2016.  FINDINGS: NECK  Hypermetabolic soft tissue noted immediately medial to the angle of the mandible, extending anteriorly, measuring approximately 5.1 x 1.9 cm (image 31 of series 4) demonstrates diffuse hypermetabolism (SUVmax = 16.8). This appears to represent an enlarged right submandibular gland, but the activity tracking anteriorly appears to track along the expected location of the duct. A portion of this soft tissue extends caudally, although this appears to be glandular tissue (rather than lymphadenopathy). No definite hypermetabolic cervical lymph nodes are noted.  CHEST  No hypermetabolic mediastinal or hilar nodes. 6 mm right lower lobe pulmonary nodule (image 110 of series 4) is noted, but highly nonspecific. No other suspicious hypermetabolic pulmonary  nodules on the CT scan. Heart size is borderline enlarged. There is no significant pericardial fluid, thickening or pericardial calcification. There is atherosclerosis of the thoracic aorta, the great vessels of the mediastinum and the coronary arteries, including calcified atherosclerotic plaque in the left main, left anterior descending, left circumflex and right coronary arteries. Mild calcifications of the aortic valve. Ectasia of the ascending thoracic aorta (4.3 cm in diameter).  ABDOMEN/PELVIS  No abnormal hypermetabolic activity within the liver, pancreas, adrenal glands, or spleen. No hypermetabolic lymph nodes in the abdomen or pelvis. Two low-attenuation lesions are noted in the kidneys bilaterally measuring up to 1.7 cm in the anterior aspect of the interpolar region of the left kidney, which are incompletely characterized  on today's noncontrast CT examination, but demonstrate no hypermetabolism, favored to represent small cysts. Atherosclerosis throughout the abdominal and pelvic vasculature, without evidence of aneurysm. No pathologic dilatation of small bowel or colon. No significant volume of ascites. No pneumoperitoneum.  SKELETON  The lesion of concern in the posterolateral aspect of the left seventh rib appears very similar to the recent chest CT measuring approximately 7.0 x 2.6 cm and is lytic and hypermetabolic (SUVmax = 0000000), with a nondisplaced pathologic fracture. No other osseous lesions are noted.  IMPRESSION: 1. Expansile lytic lesion in the posterolateral aspect of the left seventh rib with associated pathologic fracture demonstrates hypermetabolism, compatible with malignancy. Primary differential considerations include a metastatic lesion or focus of myeloma. 2. The only other suspicious area on today's study involves the right submandibular gland which is enlarged and diffusely hypermetabolic. Some of this hypermetabolism appears to  track anteriorly along the expected course of the duct for the gland. These findings could be seen in the setting of primary neoplasm of the submandibular gland, or could simply represent obstruction of the duct and hypermetabolism in the gland secondary to inflammation/infection. Alternatively, the possibility of a sublingual tumor obstructing the submandibular duct should be considered. Further evaluation by Otolaryngology is recommended in the near future. 3. Atherosclerosis, including left main and 3 vessel coronary artery disease. Assessment for potential risk factor modification, dietary therapy or pharmacologic therapy may be warranted, if clinically indicated. 4. In addition, there is ectasia of the ascending thoracic aorta (4.3 cm in diameter). Recommend annual imaging followup by CTA or MRA. This recommendation follows 2010 ACCF/AHA/AATS/ACR/ASA/SCA/SCAI/SIR/STS/SVM Guidelines for the Diagnosis and Management of Patients with Thoracic Aortic Disease. Circulation. 2010; 121: [id number]. 5. Additional incidental findings, as above.   Electronically Signed  By: Vinnie Langton M.D.  On: 03/20/2016 13:59  I personally reviewed the CT adn PET CT and concur with the findings as noted above.  Impression: 81 yo man with a lytic mass in the left 7th rib. This is 2.6 cm in diameter and 7 cm long. Possible etiologies include metastatic disease, myeloma or primary bone tumor such as an osteosarcoma. We reviewed the differential and discussed that management would depend on whether the tumor was primary or metastatic and what type of tumor it is. He had many question about surgery and its risks. We discussed those briefly- including major risks such as MI, blood clots, pain, chest wall deformity, bleeding and infection. I did not go into great detail as we don't know if surgery is indicated.  He needs a tissue diagnosis and Dr. Julien Nordmann has scheduled a needle biopsy.  He also needs  a biopsy of the right submandibular gland to assess that abnormality.  He has CAD and saw Dr. Burt Knack in March. He is not having any angina at present but we will need Dr. Antionette Char [place] and help with antiplatelet management should we pursue surgery.  He also has a 4.3 cm ascending aorta- will need annual CT followup  Lung nodule- 6 mm - needs follow up  Plan:  Needle biopsy of submandibular and left 7th rib lesions.  I will see him back in 2 weeks to discuss the results and next step in management  Melrose Nakayama, MD [facility] [phone]

## 2016-03-21 NOTE — Therapy (Signed)
[place], [place], [address] Phone: [phone]   Fax:  [phone]  Physical Therapy Evaluation  Patient Details  Name: Jose Frank MRN: [id number] Date of Birth: [birth year] Referring Provider: Dr. Curt Bears  Encounter Date: 03/21/2016      PT End of Session - 03/21/16 1430    Visit Number 1   Number of Visits 1   PT Start Time [id number]   PT Stop Time 1415   PT Time Calculation (min) 20 min   Activity Tolerance Patient tolerated treatment well   Behavior During Therapy [facility] for tasks assessed/performed      Past Medical History  Diagnosis Date  . IHD (ischemic heart disease)     Remote PCI to distal RCA and atherectomy of th LAD in 1996; S/P PCI to LCX and RCA in 2010.  Marland Kitchen Hyperlipidemia   . Hypertension   . Renal insufficiency     on Diovan  . Cancer ([facility])     tonsil  . Hx of cardiovascular stress test     a.  ETT-Myoview 8/14:  No scar or ischemia, EF 52%, normal study;  b. ETT-Myoview:  EF 55%, no ST changes, no ischemia; Normal   . History of echocardiogram     Echo 8/14:  EF 55-60%, Gr 1 DD  . Submandibular lymphadenopathy 03/21/2016    Past Surgical History  Procedure Laterality Date  . Cardiac catheterization  09/13/2009  . Cardiac catheterization  09/04/2009    EF 60%  . Tonsillectomy    . Pci  08/2009    TO THE LCX AND RCA  . Cardiovascular stress test  12/08/2007    EF 57%    There were no vitals filed for this visit.       Subjective Assessment - 03/21/16 1418    Subjective had to stop jogging about 3 weeks ago because of cough and left side pain; fell about 6 months ago on the ice and fractured a rib in the area where PET shows uptake now   Patient is accompained by: Family member  wife and niece   Pertinent History Had tonsil cancer 40 years ago and was treated with XRT.  Presented with cough, fever, and left side chest pain; workup chest x-ray showed pleural  base lung mass.  Has had CTA and PET: scans show a destructive bone lesion at left 7th rib and a large right submandibular node.  Patient will need further work-up, possibly by needle biopsy;rib may be resected and mouth may be treated locally, depending on findings.   Patient Stated Goals get info from all lung clinic providers   Currently in Pain? No/denies            [facility] PT Assessment - 03/21/16 0001    Assessment   Medical Diagnosis left pleural base lung mass; no pathology yet   Referring Provider Dr. Curt Bears   Onset Date/Surgical Date 03/07/16   Precautions   Precautions Other (comment)   Precaution Comments cancer precautions   Restrictions   Weight Bearing Restrictions No   Balance Screen   Has the patient fallen in the past 6 months Yes   How many times? 1  fell on ice; fx of left rib at mid-thoracic level   Has the patient had a decrease in activity level because of a fear of falling?  No   Is the patient reluctant to leave their home because of a fear of falling?  No  Home Environment   Living Environment Private residence   Living Arrangements Spouse/significant other   Type of [place] Layout Multi-level   Prior Function   Level of Independence Independent   Leisure until 3 weeks ago, was running 3-5 miles (11-12 minute miles) every other day; every day he did sit-ups and push-ups   Cognition   Overall Cognitive Status Within Functional Limits for tasks assessed   Observation/Other Assessments   Observations fit looking man who looks younger than his age   Functional Tests   Functional tests Sit to Stand   Sit to Stand   Comments 14 times in 30 seconds, better than average   Posture/Postural Control   Posture/Postural Control Postural limitations   Postural Limitations Forward head;Increased thoracic kyphosis   ROM / Strength   AROM / PROM / Strength AROM   AROM   Overall AROM Comments standing trunk AROM WFL all motions except extension  shows 25% limitation   Ambulation/Gait   Ambulation/Gait Yes   Ambulation/Gait Assistance 7: Independent  per patient   Balance   Balance Assessed Yes   Dynamic Standing Balance   Dynamic Standing - Comments reaches 9 inches forward in standing, less than average                           PT Education - 03/21/16 1429    Education provided Yes   Education Details posture, breathing, energy conservation, staying active (including Cure article), PT info   Person(s) Educated Patient;Spouse;Other (comment)  niece   Methods Explanation;Handout   Comprehension Verbalized understanding               Lung Clinic Goals - 03/21/16 1433    Patient will be able to verbalize understanding of the benefit of exercise to decrease fatigue.   Status Achieved   Patient will be able to verbalize the importance of posture.   Status Achieved   Patient will be able to demonstrate diaphragmatic breathing for improved lung function.   Status Achieved   Patient will be able to verbalize understanding of the role of physical therapy to prevent functional decline and who to contact if physical therapy is needed.   Status Achieved             Plan - 03/21/16 1430    Clinical Impression Statement This is a 81 year-old gentleman who was jogging 3-5 miles every other day until three weeks ago when he developed symptoms (cough, fever, left chest pain) that led to a finding of a left lung mass.  He needs further workup for a definitive diagnosis.  He has a h/o tonsil cancer 40 years ago. He did fall about six months ago, although it was on ice and seems to have been an isolated incident.   PT Frequency One time visit   PT Treatment/Interventions Patient/family education   PT Next Visit Plan None at this time.   PT Home Exercise Plan see education section   Consulted and Agree with Plan of Care Patient      Patient will benefit from skilled therapeutic intervention in order to  improve the following deficits and impairments:  Decreased range of motion, Postural dysfunction  Visit Diagnosis: Abnormal posture - Plan: PT plan of care cert/re-cert  History of falling - Plan: PT plan of care cert/re-cert      G-Codes - [id number]    Functional Assessment Tool Used clinical judgement  Functional Limitation Changing and maintaining body position   Changing and Maintaining Body Position Current Status [phone]) At least 1 percent but less than 20 percent impaired, limited or restricted   Changing and Maintaining Body Position Goal Status [id number]) At least 1 percent but less than 20 percent impaired, limited or restricted   Changing and Maintaining Body Position Discharge Status [id number]) At least 1 percent but less than 20 percent impaired, limited or restricted       Problem List Patient Active Problem List   Diagnosis Date Noted  . Submandibular lymphadenopathy 03/21/2016  . Lung mass 03/13/2016  . Lipoma of buttock 12/16/2011  . CAD (coronary artery disease) 07/29/2011  . Hyperlipidemia 07/29/2011  . CRI (chronic renal insufficiency) 07/29/2011  . HTN (hypertension) 07/29/2011    SALISBURY,DONNA 03/21/2016, 2:35 PM  [place], [place], [address] Phone: [phone]   Fax:  [phone]  Name: Jose Frank MRN: [id number] Date of Birth: 1935/06/05   Serafina Royals, PT 03/21/2016 2:35 PM

## 2016-03-21 NOTE — Progress Notes (Signed)
Jose Frank:(336) [phone]   Fax:(336) [phone] Multidisciplinary thoracic oncology clinic  CONSULT NOTE  REFERRING PHYSICIAN: Dr. Leighton Ruff  REASON FOR CONSULTATION:  81 years old white male with questionable left chest wall malignancy  HPI Jose Frank is a 81 y.o. 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 The patient has been doing fine for years with no specific complaints but he weeks ago he start complaining of increasing fatigue and weakness as well as cough and shortness of breath. He also has pain on the left rib cage. He mentioned a history of fall in December 2015 and had displaced fracture of the left 7th rib. He was seen by his primary care physician Dr. Drema Dallas and chest x-ray showed questionable lytic lesions in the left seventh rib. CT angiogram of the chest was performed on 03/07/2016 and it showed no pulmonary embolism but did was expansile lytic destructive bone mass in the posterior lateral left seventh rib with associated nondisplaced pathologic fracture. There was also solitary 0.6 cm pulmonary nodule in the basilar right lower lobe that was indeterminate. The scan also showed ascending thoracic aortic 4.2 cm aneurysm. A PET scan was performed on 03/20/2016 and it showed hypermetabolic soft tissue noted immediately medial to the angle of the mandible extending anteriorly measuring approximately 5.1 x 1.9 cm and demonstrating diffuse hypermetabolism with SUV max of 16.8 and this appears to represent an enlarging right submandibular gland but the activity tracking anteriorly along the expected location of the duct. There was no hypermetabolic mediastinal or hilar nodes. There was stable and 6 cm right  lower lobe pulmonary nodule but highly nonspecific. The lesion of concern in the posterior-lateral aspect of the left seventh rib appears very similar to the recent CT and measuring approximately 7.0 x 2.6 cm and is lytic and hypermetabolic with SUV max of 0000000 with a nondisplaced pathologic fracture. Dr. Drema Dallas kindly referred the patient to the multidisciplinary thoracic oncology clinic today for further evaluation and recommendation regarding these abnormalities on the PET scan. When seen today the patient is feeling fine and his cough and pain had improved. He continues to have shortness breath with exertion with no hemoptysis. He denied having any significant weight loss or night sweats. He denied having any nausea, vomiting, diarrhea or constipation. He has no headache or blurry vision. He mentioned that the mass in the submandibular area has been there for many years. Family history significant for father died from heart disease in his 90s and mother died from a stroke at age 57. The patient is married and has no children. He was accompanied today by his wife Latanya Presser and his niece Lattie Haw. He is currently retired and used to work for [employer]. He has no history of smoking, alcohol or drug abuse. HPI  Past Medical History  Diagnosis Date  . IHD (ischemic heart disease)     Remote PCI to distal RCA and atherectomy of th LAD in 1996; S/P PCI to LCX and RCA in 2010.  Marland Kitchen Hyperlipidemia   . Hypertension   . Renal insufficiency     on Diovan  . Cancer (HCC)     tonsil  . Hx of cardiovascular stress test     a.  ETT-Myoview 8/14:  No scar or ischemia, EF  52%, normal study;  b. ETT-Myoview:  EF 55%, no ST changes, no ischemia; Normal   . History of echocardiogram     Echo 8/14:  EF 55-60%, Gr 1 DD  . Submandibular lymphadenopathy 03/21/2016    Past Surgical History  Procedure Laterality Date  . Cardiac catheterization  09/13/2009  . Cardiac catheterization  09/04/2009    EF 60%  .  Tonsillectomy    . Pci  08/2009    TO THE LCX AND RCA  . Cardiovascular stress test  12/08/2007    EF 57%    Family History  Problem Relation Age of Onset  . Stroke Mother   . Heart disease Father     Social History Social History  Substance Use Topics  . Smoking status: Never Smoker   . Smokeless tobacco: None  . Alcohol Use: Yes     Comment: occasional    Allergies  Allergen Reactions  . Demerol     Facial swelling     Current Outpatient Prescriptions  Medication Sig Dispense Refill  . amLODipine (NORVASC) 5 MG tablet Take 1 tablet by mouth  daily (Patient taking differently: Take 5 mg by mouth  daily) 90 tablet 1  . aspirin 81 MG tablet Take 81 mg by mouth daily.      . clopidogrel (PLAVIX) 75 MG tablet Take 1 tablet by mouth  daily (Patient taking differently: Take 75 mg by mouth  daily) 90 tablet 1  . diphenhydramine-acetaminophen (TYLENOL PM) 25-500 MG TABS tablet Take 1 tablet by mouth at bedtime as needed.    Marland Kitchen GLUCOSAMINE-CHONDROITIN PO Take 1 tablet by mouth 2 (two) times daily. (1500 mg/1200 mg)    . metoprolol (LOPRESSOR) 50 MG tablet Take 25-50 mg by mouth 2 (two) times daily. TAKE 50 MG BY MOUTH IN THE AM AND TAKE 25 MG BY MOUTH IN THE PM    . Multiple Vitamin (MULTIVITAMIN) tablet Take 1 tablet by mouth daily.      . rosuvastatin (CRESTOR) 10 MG tablet Take 10 mg by mouth daily.    . tamsulosin (FLOMAX) 0.4 MG CAPS capsule Take 0.4 mg by mouth every evening.   11  . nitroGLYCERIN (NITROSTAT) 0.4 MG SL tablet Place 1 tablet (0.4 mg total) under the tongue every 5 (five) minutes as needed. (Patient not taking: Reported on 03/21/2016) 25 tablet 2   No current facility-administered medications for this visit.    Review of Systems  Constitutional: negative Eyes: negative Ears, nose, mouth, throat, and face: negative Respiratory: positive for cough, dyspnea on exertion and pleurisy/chest pain Cardiovascular: negative Gastrointestinal:  negative Genitourinary:negative Integument/breast: negative Hematologic/lymphatic: negative Musculoskeletal:negative Neurological: negative Behavioral/Psych: negative Endocrine: negative Allergic/Immunologic: negative  Physical Exam  [id number], healthy, no distress, well nourished, well developed and anxious SKIN: skin color, texture, turgor are normal, no rashes or significant lesions HEAD: Normocephalic, No masses, lesions, tenderness or abnormalities EYES: normal, PERRLA, Conjunctiva are pink and non-injected EARS: External ears normal, Canals clear OROPHARYNX:no exudate, no erythema and lips, buccal mucosa, and tongue normal  NECK: Firm submandibular nodule. LYMPH:  Firm submandibular nodule LUNGS: clear to auscultation and percussion HEART: regular rate & rhythm, no murmurs and no gallops ABDOMEN:abdomen soft, non-tender, normal bowel sounds and no masses or organomegaly BACK: Back symmetric, no curvature., No CVA tenderness EXTREMITIES:no joint deformities, effusion, or inflammation, no edema, no skin discoloration  NEURO: alert & oriented x 3 with fluent speech, no focal motor/sensory deficits  PERFORMANCE STATUS: ECOG 1  LABORATORY DATA: Lab Results  Component Value Date   WBC 5.5 03/21/2016   HGB 13.8 03/21/2016   HCT 41.9 03/21/2016   MCV 93.5 03/21/2016   PLT 190 03/21/2016      Chemistry      Component Value Date/Time   NA 142 03/21/2016 1225   NA 140 03/07/2016 1708   K 4.8 03/21/2016 1225   K 4.0 03/07/2016 1708   CL 105 03/07/2016 1708   CO2 26 03/21/2016 1225   CO2 26 03/07/2016 1708   BUN 21.8 03/21/2016 1225   BUN 23* 03/07/2016 1708   CREATININE 1.6* 03/21/2016 1225   CREATININE 1.33* 03/07/2016 1708      Component Value Date/Time   CALCIUM 9.7 03/21/2016 1225   CALCIUM 9.2 03/07/2016 1708   ALKPHOS 86 03/21/2016 1225   ALKPHOS 79 03/07/2016 1708   AST 18 03/21/2016 1225   AST 21 03/07/2016 1708   ALT 13 03/21/2016 1225   ALT 20  03/07/2016 1708   BILITOT 0.38 03/21/2016 1225   BILITOT 0.7 03/07/2016 1708       RADIOGRAPHIC STUDIES: Ct Angio Chest Pe W/cm &/or Wo Cm  03/07/2016  CLINICAL DATA:  Shortness of breath. History of tonsil cancer. Abnormal destructive appearance of the left seventh rib on chest radiograph from earlier today. EXAM: CT ANGIOGRAPHY CHEST WITH CONTRAST TECHNIQUE: Multidetector CT imaging of the chest was performed using the standard protocol during bolus administration of intravenous contrast. Multiplanar CT image reconstructions and MIPs were obtained to evaluate the vascular anatomy. CONTRAST:  153mL OMNIPAQUE IOHEXOL 350 MG/ML SOLN COMPARISON:  Chest radiograph from earlier today. FINDINGS: Mediastinum/Nodes: The study is high quality for the evaluation of pulmonary embolism. There are no filling defects in the central, lobar, segmental or subsegmental pulmonary artery branches to suggest acute pulmonary embolism. Ascending thoracic aortic 4.2 cm aneurysm. Normal caliber pulmonary arteries. Normal heart size. No pericardial fluid/thickening. Three-vessel coronary atherosclerosis. No discrete thyroid nodules. Normal size thyroid. Normal esophagus. No pathologically enlarged axillary, mediastinal or hilar lymph nodes. Lungs/Pleura: No pneumothorax. No pleural effusion. Basilar right lower lobe 6 mm pulmonary nodule (series 11/ image 75). No acute consolidative airspace disease, additional significant pulmonary nodules or lung masses. Mild atelectasis in the dependent lower lobes. Upper abdomen: Unremarkable. Musculoskeletal: There is an expansile lytic destructive bone mass in the posterior lateral left seventh rib measuring approximately 5.2 x 2.6 cm, which demonstrates mass-effect on the adjacent left pleural space, and is associated with a nondisplaced pathologic fracture. No additional aggressive appearing focal osseous lesions. Moderate to severe degenerative changes in the thoracic spine. Review of the  MIP images confirms the above findings. IMPRESSION: 1. No pulmonary embolism. 2. Expansile lytic destructive bone mass in the posterolateral left seventh rib with associated nondisplaced pathologic fracture. Differential considerations include bone metastasis or plasmacytoma. Consider correlation with CT-guided biopsy. 3. Solitary 6 mm pulmonary nodule in the basilar right lower lobe, indeterminate. Recommend attention on follow-up chest CT in 3 months. 4. Ascending thoracic aortic 4.2 cm aneurysm. Recommend annual imaging followup by CTA or MRA. This recommendation follows 2010 ACCF/AHA/AATS/ACR/ASA/SCA/SCAI/SIR/STS/SVM Guidelines for the Diagnosis and Management of Patients with Thoracic Aortic Disease. Circulation. 2010; 121: [id number] 5. Three-vessel coronary atherosclerosis. Electronically Signed   By: Ilona Sorrel M.D.   On: 03/07/2016 19:06   Nm Pet Image Initial (pi) Skull Base To Thigh  03/20/2016  CLINICAL DATA:  Initial treatment strategy for rib lesion. EXAM: NUCLEAR MEDICINE PET SKULL BASE TO THIGH TECHNIQUE: 9.1 mCi F-18 FDG was injected intravenously. Full-ring PET imaging  was performed from the skull base to thigh after the radiotracer. CT data was obtained and used for attenuation correction and anatomic localization. FASTING BLOOD GLUCOSE:  Value: 107 mg/dl COMPARISON:  Chest CT 03/07/2016. FINDINGS: NECK Hypermetabolic soft tissue noted immediately medial to the angle of the mandible, extending anteriorly, measuring approximately 5.1 x 1.9 cm (image 31 of series 4) demonstrates diffuse hypermetabolism (SUVmax = 16.8). This appears to represent an enlarged right submandibular gland, but the activity tracking anteriorly appears to track along the expected location of the duct. A portion of this soft tissue extends caudally, although this appears to be glandular tissue (rather than lymphadenopathy). No definite hypermetabolic cervical lymph nodes are noted. CHEST No hypermetabolic mediastinal or  hilar nodes. 6 mm right lower lobe pulmonary nodule (image 110 of series 4) is noted, but highly nonspecific. No other suspicious hypermetabolic pulmonary nodules on the CT scan. Heart size is borderline enlarged. There is no significant pericardial fluid, thickening or pericardial calcification. There is atherosclerosis of the thoracic aorta, the great vessels of the mediastinum and the coronary arteries, including calcified atherosclerotic plaque in the left main, left anterior descending, left circumflex and right coronary arteries. Mild calcifications of the aortic valve. Ectasia of the ascending thoracic aorta (4.3 cm in diameter). ABDOMEN/PELVIS No abnormal hypermetabolic activity within the liver, pancreas, adrenal glands, or spleen. No hypermetabolic lymph nodes in the abdomen or pelvis. Two low-attenuation lesions are noted in the kidneys bilaterally measuring up to 1.7 cm in the anterior aspect of the interpolar region of the left kidney, which are incompletely characterized on today's noncontrast CT examination, but demonstrate no hypermetabolism, favored to represent small cysts. Atherosclerosis throughout the abdominal and pelvic vasculature, without evidence of aneurysm. No pathologic dilatation of small bowel or colon. No significant volume of ascites. No pneumoperitoneum. SKELETON The lesion of concern in the posterolateral aspect of the left seventh rib appears very similar to the recent chest CT measuring approximately 7.0 x 2.6 cm and is lytic and hypermetabolic (SUVmax = 0000000), with a nondisplaced pathologic fracture. No other osseous lesions are noted. IMPRESSION: 1. Expansile lytic lesion in the posterolateral aspect of the left seventh rib with associated pathologic fracture demonstrates hypermetabolism, compatible with malignancy. Primary differential considerations include a metastatic lesion or focus of myeloma. 2. The only other suspicious area on today's study involves the right  submandibular gland which is enlarged and diffusely hypermetabolic. Some of this hypermetabolism appears to track anteriorly along the expected course of the duct for the gland. These findings could be seen in the setting of primary neoplasm of the submandibular gland, or could simply represent obstruction of the duct and hypermetabolism in the gland secondary to inflammation/infection. Alternatively, the possibility of a sublingual tumor obstructing the submandibular duct should be considered. Further evaluation by Otolaryngology is recommended in the near future. 3. Atherosclerosis, including left main and 3 vessel coronary artery disease. Assessment for potential risk factor modification, dietary therapy or pharmacologic therapy may be warranted, if clinically indicated. 4. In addition, there is ectasia of the ascending thoracic aorta (4.3 cm in diameter). Recommend annual imaging followup by CTA or MRA. This recommendation follows 2010 ACCF/AHA/AATS/ACR/ASA/SCA/SCAI/SIR/STS/SVM Guidelines for the Diagnosis and Management of Patients with Thoracic Aortic Disease. Circulation. 2010; 121: [id number]. 5. Additional incidental findings, as above. Electronically Signed   By: Vinnie Langton M.D.   On: 03/20/2016 13:59    ASSESSMENT: This is a very pleasant 81 years old white male presented with hypermetabolic submandibular firm soft tissue mass in  addition to hypermetabolic expansile lytic lesion of the left seventh rib. The patient has a history of trauma to this area in the past. It could be reactive condition but the hyper metabolic activity is very significant. I cannot rule out malignancy at this point.   PLAN: I had a lengthy discussion with the patient and his family about the findings on the PET scan at the left seventh rib as well as submandibular mass. I strongly recommended for the patient to have CT-guided core biopsy of the 2 lesions to rule out any malignancy. I will see the patient back for  follow-up visit in one week for reevaluation and more detailed discussion of his treatment options based on the biopsy results. The patient was also seen by thoracic surgery and radiation oncology for discussion of his treatment options if the biopsy showed any malignancy. He is currently asymptomatic. He was advised to call if he has any concerning symptoms in the interval. The patient was seen during the move to suppress thoracic oncology clinic today by medical oncology, radiation oncology, thoracic surgery, thoracic navigator, social worker and physical therapist. The patient voices understanding of current disease status and treatment options and is in agreement with the current care plan.  All questions were answered. The patient knows to call the clinic with any problems, questions or concerns. We can certainly see the patient much sooner if necessary.  Thank you so much for allowing me to participate in the care of Jose Frank. I will continue to follow up the patient with you and assist in his care.  I spent 40 minutes counseling the patient face to face. The total time spent in the appointment was 60 minutes.  Disclaimer: This note was dictated with voice recognition software. Similar sounding words can inadvertently be transcribed and may not be corrected upon review.   Zakk Borgen K. March 21, 2016, 1:53 PM

## 2016-03-22 ENCOUNTER — Encounter: Payer: Self-pay | Admitting: *Deleted

## 2016-03-22 ENCOUNTER — Telehealth: Payer: Self-pay | Admitting: Internal Medicine

## 2016-03-22 ENCOUNTER — Telehealth: Payer: Self-pay | Admitting: Medical Oncology

## 2016-03-22 ENCOUNTER — Telehealth: Payer: Self-pay | Admitting: *Deleted

## 2016-03-22 ENCOUNTER — Telehealth: Payer: Self-pay | Admitting: Cardiovascular Disease

## 2016-03-22 NOTE — Telephone Encounter (Signed)
Jeani Hawking from Dr Burt Knack called back and will route message to Dr Burt Knack and call back.

## 2016-03-22 NOTE — Telephone Encounter (Signed)
New message    Request for surgical clearance:  1. What type of surgery is being performed? biopsy   2. When is this surgery scheduled? 4.25.2017   Are there any medications that need to be held prior to surgery and how long?Plavix  3. Name of physician performing surgery? Interventional  Radiology - unsure of MD   4. What is your office phone and fax number? [phone] /  fax [phone]

## 2016-03-22 NOTE — Telephone Encounter (Signed)
s.w. pt and advised on 3.20 appt moved to 3.27 after BX

## 2016-03-22 NOTE — Telephone Encounter (Signed)
**Note De-Identified Dawnelle Warman Obfuscation** Lucille Passy is advised that Dr Burt Knack and his nurse are out of the office at this time and that I am will forwarding this message to them for review and advisement. She verbalized understanding.

## 2016-03-22 NOTE — Telephone Encounter (Signed)
TC from pt's wife inquiring about appt with Dr. Julien Nordmann.  Pt is scheduled for CT guided biopsy on 4/25 but appt with Dr. Julien Nordmann is scheduled for the 20th.  Per Dr. Worthy Flank note on 03/21/16, he would see pt after the biopsy to discuss treatment options based on biopsy. Therefore pt needs his 03/28/16 appt with Dr. Julien Nordmann re-scheduled accordingly

## 2016-03-22 NOTE — Progress Notes (Signed)
Oncology Nurse Navigator Documentation  Oncology Nurse Navigator Flowsheets 03/22/2016  Navigator Encounter Type Telephone  Telephone Outgoing Call  Abnormal Finding Date 03/07/2016  Treatment Phase Other  Barriers/Navigation Needs Coordination of Care  Interventions Coordination of Care  Coordination of Care Radiology;Appts  Acuity Level 1  Time Spent with Patient 15   Dr. Julien Nordmann ordered CT BIopsys yesterday and I called central scheduling to schedule.  I was told order was in review and they will call patient to schedule.

## 2016-03-22 NOTE — Telephone Encounter (Addendum)
Scheduling called and stated pt is having CT bx on 4/25 and needs to be off plavix 5 days prior to bx, Message left for Sherren Mocha , MD prescriber of plavix , to call back to auth pt stopping plavix for 5 days prior to bx on 4/25

## 2016-03-22 NOTE — Telephone Encounter (Signed)
I spoke to pts wife and told her that pt needs to be off Plavix for 5 days prior to procedure on the 25th . I told her that I notified Dr Burt Knack for him to  confirm that he wants him to stop it . I have not heard back from him and pt wife  voices understanding.

## 2016-03-25 NOTE — Telephone Encounter (Signed)
Ok to hold plavix 7 days prior to biopsy. Would resume after biopsy without loading dose.

## 2016-03-25 NOTE — Telephone Encounter (Signed)
This telephone encounter will be faxed to [phone].

## 2016-03-27 NOTE — Telephone Encounter (Signed)
Spoke to wife and pt is stopping plavix today in anticipation of bx on the 20th . I otld her Dr Burt Knack said it was okay

## 2016-03-28 ENCOUNTER — Ambulatory Visit: Payer: Medicare Other | Admitting: Internal Medicine

## 2016-03-31 ENCOUNTER — Other Ambulatory Visit: Payer: Self-pay | Admitting: Radiology

## 2016-04-01 ENCOUNTER — Other Ambulatory Visit: Payer: Self-pay | Admitting: Radiology

## 2016-04-02 ENCOUNTER — Ambulatory Visit (HOSPITAL_COMMUNITY)
Admission: RE | Admit: 2016-04-02 | Discharge: 2016-04-02 | Disposition: A | Discharge status: Home / Self Care | Payer: Medicare Other | Source: Ambulatory Visit | Attending: Internal Medicine | Admitting: Internal Medicine

## 2016-04-02 ENCOUNTER — Encounter (HOSPITAL_COMMUNITY): Payer: Self-pay

## 2016-04-02 DIAGNOSIS — C7989 Secondary malignant neoplasm of other specified sites: Secondary | ICD-10-CM | POA: Diagnosis not present

## 2016-04-02 DIAGNOSIS — Z8249 Family history of ischemic heart disease and other diseases of the circulatory system: Secondary | ICD-10-CM | POA: Diagnosis not present

## 2016-04-02 DIAGNOSIS — R531 Weakness: Secondary | ICD-10-CM | POA: Insufficient documentation

## 2016-04-02 DIAGNOSIS — I2510 Atherosclerotic heart disease of native coronary artery without angina pectoris: Secondary | ICD-10-CM | POA: Diagnosis not present

## 2016-04-02 DIAGNOSIS — N289 Disorder of kidney and ureter, unspecified: Secondary | ICD-10-CM | POA: Diagnosis not present

## 2016-04-02 DIAGNOSIS — Z823 Family history of stroke: Secondary | ICD-10-CM | POA: Insufficient documentation

## 2016-04-02 DIAGNOSIS — C080 Malignant neoplasm of submandibular gland: Secondary | ICD-10-CM | POA: Diagnosis not present

## 2016-04-02 DIAGNOSIS — I712 Thoracic aortic aneurysm, without rupture: Secondary | ICD-10-CM | POA: Diagnosis not present

## 2016-04-02 DIAGNOSIS — C7951 Secondary malignant neoplasm of bone: Secondary | ICD-10-CM | POA: Diagnosis not present

## 2016-04-02 DIAGNOSIS — Z888 Allergy status to other drugs, medicaments and biological substances status: Secondary | ICD-10-CM | POA: Insufficient documentation

## 2016-04-02 DIAGNOSIS — R590 Localized enlarged lymph nodes: Secondary | ICD-10-CM | POA: Diagnosis not present

## 2016-04-02 DIAGNOSIS — R911 Solitary pulmonary nodule: Secondary | ICD-10-CM | POA: Insufficient documentation

## 2016-04-02 DIAGNOSIS — R918 Other nonspecific abnormal finding of lung field: Secondary | ICD-10-CM | POA: Diagnosis not present

## 2016-04-02 DIAGNOSIS — M899 Disorder of bone, unspecified: Secondary | ICD-10-CM | POA: Diagnosis not present

## 2016-04-02 DIAGNOSIS — C413 Malignant neoplasm of ribs, sternum and clavicle: Secondary | ICD-10-CM | POA: Diagnosis not present

## 2016-04-02 DIAGNOSIS — R0602 Shortness of breath: Secondary | ICD-10-CM | POA: Diagnosis not present

## 2016-04-02 DIAGNOSIS — E785 Hyperlipidemia, unspecified: Secondary | ICD-10-CM | POA: Diagnosis not present

## 2016-04-02 DIAGNOSIS — I10 Essential (primary) hypertension: Secondary | ICD-10-CM | POA: Insufficient documentation

## 2016-04-02 DIAGNOSIS — I252 Old myocardial infarction: Secondary | ICD-10-CM | POA: Diagnosis not present

## 2016-04-02 DIAGNOSIS — Z7982 Long term (current) use of aspirin: Secondary | ICD-10-CM | POA: Insufficient documentation

## 2016-04-02 DIAGNOSIS — K118 Other diseases of salivary glands: Secondary | ICD-10-CM | POA: Diagnosis not present

## 2016-04-02 DIAGNOSIS — Z79899 Other long term (current) drug therapy: Secondary | ICD-10-CM | POA: Diagnosis not present

## 2016-04-02 DIAGNOSIS — Z9889 Other specified postprocedural states: Secondary | ICD-10-CM | POA: Insufficient documentation

## 2016-04-02 DIAGNOSIS — Z85818 Personal history of malignant neoplasm of other sites of lip, oral cavity, and pharynx: Secondary | ICD-10-CM | POA: Diagnosis not present

## 2016-04-02 LAB — CBC WITH DIFFERENTIAL/PLATELET
Basophils Absolute: 0 10*3/uL (ref 0.0–0.1)
Basophils Relative: 1 %
EOS PCT: 1 %
Eosinophils Absolute: 0.1 10*3/uL (ref 0.0–0.7)
HCT: 41.1 % (ref 39.0–52.0)
Hemoglobin: 14.5 g/dL (ref 13.0–17.0)
LYMPHS ABS: 1.8 10*3/uL (ref 0.7–4.0)
LYMPHS PCT: 23 %
MCH: 31.7 pg (ref 26.0–34.0)
MCHC: 35.3 g/dL (ref 30.0–36.0)
MCV: 89.7 fL (ref 78.0–100.0)
MONO ABS: 0.5 10*3/uL (ref 0.1–1.0)
Monocytes Relative: 6 %
Neutro Abs: 5.6 10*3/uL (ref 1.7–7.7)
Neutrophils Relative %: 69 %
PLATELETS: 218 10*3/uL (ref 150–400)
RBC: 4.58 MIL/uL (ref 4.22–5.81)
RDW: 12.7 % (ref 11.5–15.5)
WBC: 8 10*3/uL (ref 4.0–10.5)

## 2016-04-02 LAB — PROTIME-INR
INR: 1 (ref 0.00–1.49)
Prothrombin Time: 13.4 seconds (ref 11.6–15.2)

## 2016-04-02 LAB — BASIC METABOLIC PANEL
Anion gap: 10 (ref 5–15)
BUN: 25 mg/dL — AB (ref 6–20)
CHLORIDE: 106 mmol/L (ref 101–111)
CO2: 27 mmol/L (ref 22–32)
CREATININE: 1.5 mg/dL — AB (ref 0.61–1.24)
Calcium: 9.6 mg/dL (ref 8.9–10.3)
GFR calc Af Amer: 49 mL/min — ABNORMAL LOW (ref >60)
GFR calc non Af Amer: 43 mL/min — ABNORMAL LOW (ref >60)
GLUCOSE: 107 mg/dL — AB (ref 65–99)
POTASSIUM: 4.3 mmol/L (ref 3.5–5.1)
Sodium: 143 mmol/L (ref 135–145)

## 2016-04-02 MED ORDER — MIDAZOLAM HCL 2 MG/2ML IJ SOLN
INTRAMUSCULAR | Status: AC
  Filled 2016-04-02: qty 8

## 2016-04-02 MED ORDER — SODIUM CHLORIDE 0.9 % IV SOLN
INTRAVENOUS | Status: DC
  Administered 2016-04-02: 11:00:00 via INTRAVENOUS

## 2016-04-02 MED ORDER — MIDAZOLAM HCL 2 MG/2ML IJ SOLN
INTRAMUSCULAR | Status: AC | PRN
  Administered 2016-04-02 (×6): 1 mg via INTRAVENOUS

## 2016-04-02 MED ORDER — FENTANYL CITRATE (PF) 100 MCG/2ML IJ SOLN
INTRAMUSCULAR | Status: AC
  Filled 2016-04-02: qty 4

## 2016-04-02 MED ORDER — FENTANYL CITRATE (PF) 100 MCG/2ML IJ SOLN
INTRAMUSCULAR | Status: AC | PRN
  Administered 2016-04-02: 25 ug via INTRAVENOUS
  Administered 2016-04-02: 50 ug via INTRAVENOUS
  Administered 2016-04-02: 25 ug via INTRAVENOUS

## 2016-04-02 NOTE — Procedures (Signed)
Technically successful CT guided biopsy of mixed lytic and sclerotic mass involving the lateral aspect of the left 7th rib.  Technically successful US guided biopsy of indeterminate right submandibular mass.   EBL: None No immediate post procedural complications.   Ronny Bacon, MD Pager #: [phone]

## 2016-04-02 NOTE — Discharge Instructions (Signed)
Needle Biopsy, Care After °These instructions give you information about caring for yourself after your procedure. Your doctor may also give you more specific instructions. Call your doctor if you have any problems or questions after your procedure. °HOME CARE °· Rest as told by your doctor. °· Take medicines only as told by your doctor. °· There are many different ways to close and cover the biopsy site, including stitches (sutures), skin glue, and adhesive strips. Follow instructions from your doctor about: °¨ How to take care of your biopsy site. °¨ When and how you should change your bandage (dressing). °¨ When you should remove your dressing. °¨ Removing whatever was used to close your biopsy site. °· Check your biopsy site every day for signs of infection. Watch for: °¨ Redness, swelling, or pain. °¨ Fluid, blood, or pus. °GET HELP IF: °· You have a fever. °· You have redness, swelling, or pain at the biopsy site, and it lasts longer than a few days. °· You have fluid, blood, or pus coming from the biopsy site. °· You feel sick to your stomach (nauseous). °· You throw up (vomit). °GET HELP RIGHT AWAY IF: °· You are short of breath. °· You have trouble breathing. °· Your chest hurts. °· You feel dizzy or you pass out (faint). °· You have bleeding that does not stop with pressure or a bandage. °· You cough up blood. °· Your belly (abdomen) hurts. °  °This information is not intended to replace advice given to you by your health care provider. Make sure you discuss any questions you have with your health care provider. °  °Document Released: 11/07/2008 Document Revised: 04/11/2015 Document Reviewed: 11/21/2014 °Elsevier Interactive Patient Education ©2016 Elsevier Inc[employer] ° ° ° °Moderate Conscious Sedation, Adult °Sedation is the use of medicines to promote relaxation and relieve discomfort and anxiety. Moderate conscious sedation is a type of sedation. Under moderate conscious sedation you are less alert than  normal but are still able to respond to instructions or stimulation. Moderate conscious sedation is used during short medical and dental procedures. It is milder than deep sedation or general anesthesia and allows you to return to your regular activities sooner. °LET YOUR HEALTH CARE PROVIDER KNOW ABOUT:  °· Any allergies you have. °· All medicines you are taking, including vitamins, herbs, eye drops, creams, and over-the-counter medicines. °· Use of steroids (by mouth or creams). °· Previous problems you or members of your family have had with the use of anesthetics. °· Any blood disorders you have. °· Previous surgeries you have had. °· Medical conditions you have. °· Possibility of pregnancy, if this applies. °· Use of cigarettes, alcohol, or illegal drugs. °RISKS AND COMPLICATIONS °Generally, this is a safe procedure. However, as with any procedure, problems can occur. Possible problems include: °· Oversedation. °· Trouble breathing on your own. You may need to have a breathing tube until you are awake and breathing on your own. °· Allergic reaction to any of the medicines used for the procedure. °BEFORE THE PROCEDURE °· You may have blood tests done. These tests can help show how well your kidneys and liver are working. They can also show how well your blood clots. °· A physical exam will be done.   °· Only take medicines as directed by your health care provider. You may need to stop taking medicines (such as blood thinners, aspirin, or nonsteroidal anti-inflammatory drugs) before the procedure.   °· Do not eat or drink at least 6 hours before the procedure or   as directed by your health care provider. °· Arrange for a responsible adult, family member, or friend to take you home after the procedure. He or she should stay with you for at least 24 hours after the procedure, until the medicine has worn off. °PROCEDURE  °· An intravenous (IV) catheter will be inserted into one of your veins. Medicine will be able to  flow directly into your body through this catheter. You may be given medicine through this tube to help prevent pain and help you relax. °· The medical or dental procedure will be done. °AFTER THE PROCEDURE °· You will stay in a recovery area until the medicine has worn off. Your blood pressure and pulse will be checked.   °·  Depending on the procedure you had, you may be allowed to go home when you can tolerate liquids and your pain is under control. °  °This information is not intended to replace advice given to you by your health care provider. Make sure you discuss any questions you have with your health care provider. °  °Document Released: 08/20/2001 Document Revised: 12/16/2014 Document Reviewed: 08/02/2013 °Elsevier Interactive Patient Education ©2016 Else[employer] Inc. ° °

## 2016-04-02 NOTE — Sedation Documentation (Signed)
Patient denies pain and is resting comfortably.  

## 2016-04-02 NOTE — Discharge Instructions (Signed)
°Moderate Conscious Sedation, Adult, Care After °Refer to this sheet in the next few weeks. These instructions provide you with information on caring for yourself after your procedure. Your health care provider may also give you more specific instructions. Your treatment has been planned according to current medical practices, but problems sometimes occur. Call your health care provider if you have any problems or questions after your procedure. °WHAT TO EXPECT AFTER THE PROCEDURE  °After your procedure: °· You may feel sleepy, clumsy, and have poor balance for several hours. °· Vomiting may occur if you eat too soon after the procedure. °HOME CARE INSTRUCTIONS °· Do not participate in any activities where you could become injured for at least 24 hours. Do not: °¨ Drive. °¨ Swim. °¨ Ride a bicycle. °¨ Operate heavy machinery. °¨ Cook. °¨ Use power tools. °¨ Climb ladders. °¨ Work from a high place. °· Do not make important decisions or sign legal documents until you are improved. °· If you vomit, drink water, juice, or soup when you can drink without vomiting. Make sure you have little or no nausea before eating solid foods. °· Only take over-the-counter or prescription medicines for pain, discomfort, or fever as directed by your health care provider. °· Make sure you and your family fully understand everything about the medicines given to you, including what side effects may occur. °· You should not drink alcohol, take sleeping pills, or take medicines that cause drowsiness for at least 24 hours. °· If you smoke, do not smoke without supervision. °· If you are feeling better, you may resume normal activities 24 hours after you were sedated. °· Keep all appointments with your health care provider. °SEEK MEDICAL CARE IF: °· Your skin is pale or bluish in color. °· You continue to feel nauseous or vomit. °· Your pain is getting worse and is not helped by medicine. °· You have bleeding or swelling. °· You are still  sleepy or feeling clumsy after 24 hours. °SEEK IMMEDIATE MEDICAL CARE IF: °· You develop a rash. °· You have difficulty breathing. °· You develop any type of allergic problem. °· You have a fever. °MAKE SURE YOU: °· Understand these instructions. °· Will watch your condition. °· Will get help right away if you are not doing well or get worse. °  °This information is not intended to replace advice given to you by your health care provider. Make sure you discuss any questions you have with your health care provider. °  °Document Released: 09/15/2013 Document Revised: 12/16/2014 Document Reviewed: 09/15/2013 °Elsevier Interactive Patient Education ©2016 [employer] °Needle Biopsy, Care After °These instructions give you information about caring for yourself after your procedure. Your doctor may also give you more specific instructions. Call your doctor if you have any problems or questions after your procedure. °HOME CARE °· Rest as told by your doctor. °· Take medicines only as told by your doctor. °· There are many different ways to close and cover the biopsy site, including stitches (sutures), skin glue, and adhesive strips. Follow instructions from your doctor about: °¨ How to take care of your biopsy site. °¨ When and how you should change your bandage (dressing). °¨ When you should remove your dressing. °¨ Removing whatever was used to close your biopsy site. °· Check your biopsy site every day for signs of infection. Watch for: °¨ Redness, swelling, or pain. °¨ Fluid, blood, or pus. °GET HELP IF: °· You have a fever. °· You have redness, swelling, or   pain at the biopsy site, and it lasts longer than a few days. °· You have fluid, blood, or pus coming from the biopsy site. °· You feel sick to your stomach (nauseous). °· You throw up (vomit). °GET HELP RIGHT AWAY IF: °· You are short of breath. °· You have trouble breathing. °· Your chest hurts. °· You feel dizzy or you pass out (faint). °· You have bleeding  that does not stop with pressure or a bandage. °· You cough up blood. °· Your belly (abdomen) hurts. °  °This information is not intended to replace advice given to you by your health care provider. Make sure you discuss any questions you have with your health care provider. °  °Document Released: 11/07/2008 Document Revised: 04/11/2015 Document Reviewed: 11/21/2014 °Elsevier Interactive Patient Education ©2016 Elsevier Inc. ° °

## 2016-04-02 NOTE — Consult Note (Signed)
Chief Complaint: Patient was seen in consultation today for ultrasound-guided right submandibular mass biopsy as well as CT guided left seventh rib lesion biopsy  Referring Physician(s): Mohamed,Mohamed  Supervising Physician: Sandi Mariscal  Patient Status:  Out-pt  History of Present Illness: JHOVANI ARMACOST is a 81 y.o. male with past medical history significant for ischemic heart disease, hyperlipidemia, hypertension, renal insufficiency and prior tonsil cancer in 1977. He recently began to experience some fatigue, weakness, cough as well as occasional dyspnea with exertion. He suffered a fall in December 2015 with a subsequent displaced fracture left seventh rib. Due to some persistent shortness of breath as well as left lateral chest discomfort the patient underwent CT angiography in March of this year which revealed no PE but showed an expansile lytic destructive bone mass in the left seventh rib with associated fracture , a solitary 6 mm pulmonary nodule in the right lower lobe as well as  a descending thoracic aortic aneurysm. Subsequent PET scan on 03/20/16 has revealed hypermetabolic activity within the left seventh rib lesion as well as in a right submandibular mass which according to the patient has been present for several years. He now presents for ultrasound-guided right submandibular mass and CT-guided left seventh rib lesion biopsies.  Past Medical History  Diagnosis Date  . IHD (ischemic heart disease)     Remote PCI to distal RCA and atherectomy of th LAD in 1996; S/P PCI to LCX and RCA in 2010.  Marland Kitchen Hyperlipidemia   . Hypertension   . Renal insufficiency     on Diovan  . Hx of cardiovascular stress test     a.  ETT-Myoview 8/14:  No scar or ischemia, EF 52%, normal study;  b. ETT-Myoview:  EF 55%, no ST changes, no ischemia; Normal   . History of echocardiogram     Echo 8/14:  EF 55-60%, Gr 1 DD  . Submandibular lymphadenopathy 03/21/2016  . Cancer ([place]) 1977    tonsil     Past Surgical History  Procedure Laterality Date  . Cardiac catheterization  09/13/2009  . Cardiac catheterization  09/04/2009    EF 60%  . Tonsillectomy    . Pci  08/2009    TO THE LCX AND RCA  . Cardiovascular stress test  12/08/2007    EF 57%    Allergies: Demerol  Medications: Prior to Admission medications   Medication Sig Start Date End Date Taking? Authorizing Provider  amLODipine (NORVASC) 5 MG tablet Take 1 tablet by mouth  daily Patient taking differently: Take 5 mg by mouth  daily 01/15/16  Yes Sherren Mocha, MD  diphenhydramine-acetaminophen (TYLENOL PM) 25-500 MG TABS tablet Take 1 tablet by mouth at bedtime as needed.   Yes Historical Provider, MD  GLUCOSAMINE-CHONDROITIN PO Take 1 tablet by mouth 2 (two) times daily. (1500 mg/1200 mg)   Yes Historical Provider, MD  metoprolol (LOPRESSOR) 50 MG tablet Take 25-50 mg by mouth 2 (two) times daily. TAKE 50 MG BY MOUTH IN THE AM AND TAKE 25 MG BY MOUTH IN THE PM   Yes Historical Provider, MD  Multiple Vitamin (MULTIVITAMIN) tablet Take 1 tablet by mouth daily.     Yes Historical Provider, MD  rosuvastatin (CRESTOR) 10 MG tablet Take 10 mg by mouth daily. 03/18/16  Yes Leighton Ruff, MD  tamsulosin (FLOMAX) 0.4 MG CAPS capsule Take 0.4 mg by mouth every evening.  01/31/15  Yes Historical Provider, MD  aspirin 81 MG tablet Take 81 mg by mouth  daily.      Historical Provider, MD  clopidogrel (PLAVIX) 75 MG tablet Take 1 tablet by mouth  daily Patient taking differently: Take 75 mg by mouth  daily 01/15/16   Sherren Mocha, MD  nitroGLYCERIN (NITROSTAT) 0.4 MG SL tablet Place 1 tablet (0.4 mg total) under the tongue every 5 (five) minutes as needed. Patient not taking: Reported on 03/21/2016 02/19/13   Sherren Mocha, MD     Family History  Problem Relation Age of Onset  . Stroke Mother   . Heart disease Father     Social History   Social History  . Marital Status: Married    Spouse Name: N/A  . Number of Children:  N/A  . Years of Education: N/A   Occupational History  . Retired     Worked in [employer] with [employer] Topics  . Smoking status: Never Smoker   . Smokeless tobacco: None  . Alcohol Use: Yes     Comment: occasional  . Drug Use: No  . Sexual Activity: Not Asked   Other Topics Concern  . 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 He does have occasional cough, some dyspnea with exertion.   Vital Signs: BP 153/86 mmHg  Pulse 64  Temp(Src) 98.1 F (36.7 C) (Oral)  Resp 18  Ht 6' (1.829 m)  Wt 180 lb (81.647 kg)  BMI 24.41 kg/m2  SpO2 100%  Physical Exam patient awake, alert. Chest clear to auscultation bilaterally. Heart with regular rate and rhythm. Adam was soft, positive bowel sounds, nontender. Extremities no significant edema. Noted right submandibular mass, nontender to palpation.  Mallampati Score:     Imaging: Ct Angio Chest Pe W/cm &/or Wo Cm  03/07/2016  CLINICAL DATA:  Shortness of breath. History of tonsil cancer. Abnormal destructive appearance of the left seventh rib on chest radiograph from earlier today. EXAM: CT ANGIOGRAPHY CHEST WITH CONTRAST TECHNIQUE: Multidetector CT imaging of the chest was performed using the standard protocol during bolus administration of intravenous contrast. Multiplanar CT image reconstructions and MIPs were obtained to evaluate the vascular anatomy. CONTRAST:  167mL OMNIPAQUE IOHEXOL 350 MG/ML SOLN COMPARISON:  Chest radiograph from earlier today. FINDINGS: Mediastinum/Nodes: The study is high quality for the evaluation of pulmonary embolism. There are no filling defects in the central, lobar, segmental or subsegmental pulmonary artery branches to suggest acute pulmonary embolism. Ascending  thoracic aortic 4.2 cm aneurysm. Normal caliber pulmonary arteries. Normal heart size. No pericardial fluid/thickening. Three-vessel coronary atherosclerosis. No discrete thyroid nodules. Normal size thyroid. Normal esophagus. No pathologically enlarged axillary, mediastinal or hilar lymph nodes. Lungs/Pleura: No pneumothorax. No pleural effusion. Basilar right lower lobe 6 mm pulmonary nodule (series 11/ image 75). No acute consolidative airspace disease, additional significant pulmonary nodules or lung masses. Mild atelectasis in the dependent lower lobes. Upper abdomen: Unremarkable. Musculoskeletal: There is an expansile lytic destructive bone mass in the posterior lateral left seventh rib measuring approximately 5.2 x 2.6 cm, which demonstrates mass-effect on the adjacent left pleural space, and is associated with a nondisplaced pathologic fracture. No additional aggressive appearing focal osseous lesions. Moderate to severe degenerative changes in the thoracic spine. Review of the MIP images confirms the above findings. IMPRESSION: 1. No pulmonary embolism. 2. Expansile lytic  destructive bone mass in the posterolateral left seventh rib with associated nondisplaced pathologic fracture. Differential considerations include bone metastasis or plasmacytoma. Consider correlation with CT-guided biopsy. 3. Solitary 6 mm pulmonary nodule in the basilar right lower lobe, indeterminate. Recommend attention on follow-up chest CT in 3 months. 4. Ascending thoracic aortic 4.2 cm aneurysm. Recommend annual imaging followup by CTA or MRA. This recommendation follows 2010 ACCF/AHA/AATS/ACR/ASA/SCA/SCAI/SIR/STS/SVM Guidelines for the Diagnosis and Management of Patients with Thoracic Aortic Disease. Circulation. 2010; 121: [id number] 5. Three-vessel coronary atherosclerosis. Electronically Signed   By: Ilona Sorrel M.D.   On: 03/07/2016 19:06   Nm Pet Image Initial (pi) Skull Base To Thigh  03/20/2016  CLINICAL DATA:  Initial  treatment strategy for rib lesion. EXAM: NUCLEAR MEDICINE PET SKULL BASE TO THIGH TECHNIQUE: 9.1 mCi F-18 FDG was injected intravenously. Full-ring PET imaging was performed from the skull base to thigh after the radiotracer. CT data was obtained and used for attenuation correction and anatomic localization. FASTING BLOOD GLUCOSE:  Value: 107 mg/dl COMPARISON:  Chest CT 03/07/2016. FINDINGS: NECK Hypermetabolic soft tissue noted immediately medial to the angle of the mandible, extending anteriorly, measuring approximately 5.1 x 1.9 cm (image 31 of series 4) demonstrates diffuse hypermetabolism (SUVmax = 16.8). This appears to represent an enlarged right submandibular gland, but the activity tracking anteriorly appears to track along the expected location of the duct. A portion of this soft tissue extends caudally, although this appears to be glandular tissue (rather than lymphadenopathy). No definite hypermetabolic cervical lymph nodes are noted. CHEST No hypermetabolic mediastinal or hilar nodes. 6 mm right lower lobe pulmonary nodule (image 110 of series 4) is noted, but highly nonspecific. No other suspicious hypermetabolic pulmonary nodules on the CT scan. Heart size is borderline enlarged. There is no significant pericardial fluid, thickening or pericardial calcification. There is atherosclerosis of the thoracic aorta, the great vessels of the mediastinum and the coronary arteries, including calcified atherosclerotic plaque in the left main, left anterior descending, left circumflex and right coronary arteries. Mild calcifications of the aortic valve. Ectasia of the ascending thoracic aorta (4.3 cm in diameter). ABDOMEN/PELVIS No abnormal hypermetabolic activity within the liver, pancreas, adrenal glands, or spleen. No hypermetabolic lymph nodes in the abdomen or pelvis. Two low-attenuation lesions are noted in the kidneys bilaterally measuring up to 1.7 cm in the anterior aspect of the interpolar region of the  left kidney, which are incompletely characterized on today's noncontrast CT examination, but demonstrate no hypermetabolism, favored to represent small cysts. Atherosclerosis throughout the abdominal and pelvic vasculature, without evidence of aneurysm. No pathologic dilatation of small bowel or colon. No significant volume of ascites. No pneumoperitoneum. SKELETON The lesion of concern in the posterolateral aspect of the left seventh rib appears very similar to the recent chest CT measuring approximately 7.0 x 2.6 cm and is lytic and hypermetabolic (SUVmax = 0000000), with a nondisplaced pathologic fracture. No other osseous lesions are noted. IMPRESSION: 1. Expansile lytic lesion in the posterolateral aspect of the left seventh rib with associated pathologic fracture demonstrates hypermetabolism, compatible with malignancy. Primary differential considerations include a metastatic lesion or focus of myeloma. 2. The only other suspicious area on today's study involves the right submandibular gland which is enlarged and diffusely hypermetabolic. Some of this hypermetabolism appears to track anteriorly along the expected course of the duct for the gland. These findings could be seen in the setting of primary neoplasm of the submandibular gland, or could simply represent obstruction of the duct and hypermetabolism in  the gland secondary to inflammation/infection. Alternatively, the possibility of a sublingual tumor obstructing the submandibular duct should be considered. Further evaluation by Otolaryngology is recommended in the near future. 3. Atherosclerosis, including left main and 3 vessel coronary artery disease. Assessment for potential risk factor modification, dietary therapy or pharmacologic therapy may be warranted, if clinically indicated. 4. In addition, there is ectasia of the ascending thoracic aorta (4.3 cm in diameter). Recommend annual imaging followup by CTA or MRA. This recommendation follows 2010  ACCF/AHA/AATS/ACR/ASA/SCA/SCAI/SIR/STS/SVM Guidelines for the Diagnosis and Management of Patients with Thoracic Aortic Disease. Circulation. 2010; 121: [id number]. 5. Additional incidental findings, as above. Electronically Signed   By: Vinnie Langton M.D.   On: 03/20/2016 13:59    Labs:  CBC:  Recent Labs  03/07/16 1708 03/21/16 1225 04/02/16 1120  WBC 6.0 5.5 8.0  HGB 13.2 13.8 14.5  HCT 38.0* 41.9 41.1  PLT 267 190 218    COAGS:  Recent Labs  04/02/16 1120  INR 1.00    BMP:  Recent Labs  03/07/16 1708 03/21/16 1225 04/02/16 1120  NA 140 142 143  K 4.0 4.8 4.3  CL 105  --  106  CO2 26 26 27   GLUCOSE 93 100 107*  BUN 23* 21.8 25*  CALCIUM 9.2 9.7 9.6  CREATININE 1.33* 1.6* 1.50*  GFRNONAA 49*  --  43*  GFRAA 57*  --  49*    LIVER FUNCTION TESTS:  Recent Labs  03/07/16 1708 03/21/16 1225  BILITOT 0.7 0.38  AST 21 18  ALT 20 13  ALKPHOS 79 86  PROT 7.0 7.2  ALBUMIN 3.6 3.7    TUMOR MARKERS: No results for input(s): AFPTM, CEA, CA199, CHROMGRNA in the last 8760 hours.  Assessment and Plan: 82 y.o. male with past medical history significant for ischemic heart disease, hyperlipidemia, hypertension, renal insufficiency and prior tonsil cancer in 1977. He recently began to experience some fatigue, weakness, cough as well as occasional dyspnea with exertion. He suffered a fall in December 2015 with a subsequent displaced fracture left seventh rib. Due to some persistent shortness of breath as well as left lateral chest discomfort the patient underwent CT angiography in March of this year which revealed no PE but showed an expansile lytic destructive bone mass in the left seventh rib with associated fracture , a solitary 6 mm pulmonary nodule in the right lower lobe as well as  a descending thoracic aortic aneurysm. Subsequent PET scan on 03/20/16 has revealed hypermetabolic activity within the left seventh rib lesion as well as in a right submandibular mass  which according to the patient has been present for several years. He now presents for ultrasound-guided right submandibular mass and CT-guided left seventh rib lesion biopsies.Risks and benefits discussed with the patient/wife including, but not limited to bleeding, infection, damage to adjacent structures or low yield requiring additional tests.All of the patient's questions were answered, patient is agreeable to proceed.Consent signed and in chart.      Thank you for this interesting consult.  I greatly enjoyed meeting Jakav[facility] and look forward to participating in their care.  A copy of this report was sent to the requesting provider on this date.  Electronically Signed: D. Rowe Robert 04/02/2016, 12:59 PM   I spent a total of 30 minutes in face to face in clinical consultation, greater than 50% of which was counseling/coordinating care for ultrasound-guided right submandibular mass and CT-guided left seventh rib lesion biopsies

## 2016-04-02 NOTE — Sedation Documentation (Signed)
Pt transported to CT on [facility] for rib lesion biopsy.

## 2016-04-04 ENCOUNTER — Telehealth: Payer: Self-pay | Admitting: Internal Medicine

## 2016-04-04 ENCOUNTER — Ambulatory Visit (HOSPITAL_BASED_OUTPATIENT_CLINIC_OR_DEPARTMENT_OTHER): Payer: Medicare Other | Admitting: Internal Medicine

## 2016-04-04 ENCOUNTER — Encounter: Payer: Self-pay | Admitting: *Deleted

## 2016-04-04 ENCOUNTER — Encounter: Payer: Self-pay | Admitting: Internal Medicine

## 2016-04-04 VITALS — BP 145/89 | HR 74 | Temp 98.0°F | Resp 18 | Ht 72.0 in | Wt 183.9 lb

## 2016-04-04 DIAGNOSIS — C7951 Secondary malignant neoplasm of bone: Secondary | ICD-10-CM

## 2016-04-04 DIAGNOSIS — R590 Localized enlarged lymph nodes: Secondary | ICD-10-CM

## 2016-04-04 DIAGNOSIS — R918 Other nonspecific abnormal finding of lung field: Secondary | ICD-10-CM

## 2016-04-04 DIAGNOSIS — C770 Secondary and unspecified malignant neoplasm of lymph nodes of head, face and neck: Secondary | ICD-10-CM | POA: Diagnosis not present

## 2016-04-04 DIAGNOSIS — C801 Malignant (primary) neoplasm, unspecified: Secondary | ICD-10-CM

## 2016-04-04 DIAGNOSIS — C799 Secondary malignant neoplasm of unspecified site: Secondary | ICD-10-CM | POA: Insufficient documentation

## 2016-04-04 HISTORY — DX: Secondary malignant neoplasm of unspecified site: C79.9

## 2016-04-04 NOTE — Progress Notes (Signed)
Oncology Nurse Navigator Documentation  Oncology Nurse Navigator Flowsheets 04/04/2016  Navigator Encounter Type Clinic/MDC  Treatment Phase Pre-Tx/Tx Discussion  Barriers/Navigation Needs Coordination of Care;Education  Interventions Coordination of Care  Coordination of Care Appts  Acuity Level 2  Acuity Level 2 Educational needs  Time Spent with Patient 26   Spoke with patient and family today at [facility].  Per Dr. Julien Nordmann, he would like patient's appt to be cancelled with Dr. Roxan Hockey next week.  I notified Dr. Leonarda Salon office.  He would like patient to be presented at thoracic cancer conference next week.  I will notify pathology of request.

## 2016-04-04 NOTE — Telephone Encounter (Signed)
Gave appt & avs

## 2016-04-04 NOTE — Progress Notes (Signed)
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 male returns to the clinic today for follow-up visit accompanied by his wife, Haynes Dage and his niece Lattie Haw. The patient is feeling fine today with no specific complaints except for mild pain in the left side of the chest wall. He denied having any significant shortness of breath, cough or hemoptysis. He denied having any recent weight loss or night sweats. He has no nausea or vomiting. He has no fever or chills. The patient recently underwent CT-guided core biopsy of the submandibular gland as well as the bony lesion in the left seventh rib. The preliminary pathology from Dr. Saralyn Pilar indicated similar adenocarcinoma in both biopsies, questionable for mucoepidermoid carcinoma. Dr. Saralyn Pilar is performing more immunohistochemical stains to identify the primary source of this malignancy. The patient is here today for evaluation and discussion of his condition.  MEDICAL HISTORY: Past Medical History  Diagnosis Date  . IHD (ischemic heart disease)     Remote PCI to distal RCA and atherectomy of th LAD in 1996; S/P PCI to LCX and RCA in 2010.  Marland Kitchen Hyperlipidemia   . Hypertension   . Renal insufficiency     on Diovan  . Hx of cardiovascular stress test     a.  ETT-Myoview 8/14:  No scar or ischemia, EF 52%, normal study;  b. ETT-Myoview:  EF 55%, no ST changes, no ischemia; Normal   . History of echocardiogram     Echo 8/14:  EF 55-60%, Gr 1 DD  . Submandibular lymphadenopathy 03/21/2016  . Cancer ([place]) 1977    tonsil    ALLERGIES:  is allergic to  demerol.  MEDICATIONS:  Current Outpatient Prescriptions  Medication Sig Dispense Refill  . amLODipine (NORVASC) 5 MG tablet Take 1 tablet by mouth  daily (Patient taking differently: Take 5 mg by mouth  daily) 90 tablet 1  . aspirin 81 MG tablet Take 81 mg by mouth daily.      . clopidogrel (PLAVIX) 75 MG tablet Take 1 tablet by mouth  daily (Patient taking differently: Take 75 mg by mouth  daily) 90 tablet 1  . diphenhydramine-acetaminophen (TYLENOL PM) 25-500 MG TABS tablet Take 1 tablet by mouth at bedtime as needed.    Marland Kitchen GLUCOSAMINE-CHONDROITIN PO Take 1 tablet by mouth 2 (two) times daily. (1500 mg/1200 mg)    . metoprolol (LOPRESSOR) 50 MG tablet Take 25-50 mg by mouth 2 (two) times daily. TAKE 50 MG BY MOUTH IN THE AM AND TAKE 25 MG BY MOUTH IN THE PM    . Multiple Vitamin (MULTIVITAMIN) tablet Take 1 tablet by mouth daily.      . nitroGLYCERIN (NITROSTAT) 0.4 MG SL tablet Place 1 tablet (0.4 mg total) under the tongue every 5 (five) minutes as needed. (Patient not taking: Reported on 03/21/2016) 25 tablet 2  . rosuvastatin (CRESTOR) 10 MG tablet Take 10 mg by mouth daily.    . tamsulosin (FLOMAX) 0.4 MG CAPS capsule Take 0.4 mg by mouth every evening.   11   No current facility-administered medications for this visit.    SURGICAL HISTORY:  Past Surgical History  Procedure Laterality  Date  . Cardiac catheterization  09/13/2009  . Cardiac catheterization  09/04/2009    EF 60%  . Tonsillectomy    . Pci  08/2009    TO THE LCX AND RCA  . Cardiovascular stress test  12/08/2007    EF 57%    REVIEW OF SYSTEMS:  Constitutional: negative Eyes: negative Ears, nose, mouth, throat, and face: negative Respiratory: negative Cardiovascular: negative Gastrointestinal: negative Genitourinary:negative Integument/breast: negative Hematologic/lymphatic: negative Musculoskeletal:negative Neurological: negative Behavioral/Psych: negative Endocrine: negative Allergic/Immunologic: negative    PHYSICAL EXAMINATION: General appearance: alert, cooperative and no distress Head: Normocephalic, without obvious abnormality, atraumatic Neck: no carotid bruit, no JVD, supple, symmetrical, trachea midline, thyroid not enlarged, symmetric, no tenderness/mass/nodules and large submandibular mass Lymph nodes: large submandibular mass Resp: clear to auscultation bilaterally Back: symmetric, no curvature. ROM normal. No CVA tenderness. Cardio: regular rate and rhythm, S1, S2 normal, no murmur, click, rub or gallop GI: soft, non-tender; bowel sounds normal; no masses,  no organomegaly Extremities: extremities normal, atraumatic, no cyanosis or edema Neurologic: Alert and oriented X 3, normal strength and tone. Normal symmetric reflexes. Normal coordination and gait  ECOG PERFORMANCE STATUS: 1 - Symptomatic but completely ambulatory  Blood pressure 145/89, pulse 74, temperature 98 F (36.7 C), temperature source Oral, resp. rate 18, height 6' (1.829 m), weight 183 lb 14.4 oz (83.416 kg), SpO2 99 %.  LABORATORY DATA: Lab Results  Component Value Date   WBC 8.0 04/02/2016   HGB 14.5 04/02/2016   HCT 41.1 04/02/2016   MCV 89.7 04/02/2016   PLT 218 04/02/2016      Chemistry      Component Value Date/Time   NA 143 04/02/2016 1120   NA 142 03/21/2016 1225   K 4.3 04/02/2016 1120   K 4.8 03/21/2016 1225   CL 106 04/02/2016 1120   CO2 27 04/02/2016 1120   CO2 26 03/21/2016 1225   BUN 25* 04/02/2016 1120   BUN 21.8 03/21/2016 1225   CREATININE 1.50* 04/02/2016 1120   CREATININE 1.6* 03/21/2016 1225      Component Value Date/Time   CALCIUM 9.6 04/02/2016 1120   CALCIUM 9.7 03/21/2016 1225   ALKPHOS 86 03/21/2016 1225   ALKPHOS 79 03/07/2016 1708   AST 18 03/21/2016 1225   AST 21 03/07/2016 1708   ALT 13 03/21/2016 1225   ALT 20 03/07/2016 1708   BILITOT 0.38 03/21/2016 1225   BILITOT 0.7 03/07/2016 1708       RADIOGRAPHIC STUDIES: Ct Angio Chest Pe W/cm &/or Wo  Cm  03/07/2016  CLINICAL DATA:  Shortness of breath. History of tonsil cancer. Abnormal destructive appearance of the left seventh rib on chest radiograph from earlier today. EXAM: CT ANGIOGRAPHY CHEST WITH CONTRAST TECHNIQUE: Multidetector CT imaging of the chest was performed using the standard protocol during bolus administration of intravenous contrast. Multiplanar CT image reconstructions and MIPs were obtained to evaluate the vascular anatomy. CONTRAST:  146mL OMNIPAQUE IOHEXOL 350 MG/ML SOLN COMPARISON:  Chest radiograph from earlier today. FINDINGS: Mediastinum/Nodes: The study is high quality for the evaluation of pulmonary embolism. There are no filling defects in the central, lobar, segmental or subsegmental pulmonary artery branches to suggest acute pulmonary embolism. Ascending thoracic aortic 4.2 cm aneurysm. Normal caliber pulmonary arteries. Normal heart size. No pericardial fluid/thickening. Three-vessel coronary atherosclerosis. No discrete thyroid nodules. Normal size thyroid. Normal esophagus. No pathologically enlarged axillary, mediastinal or hilar lymph nodes. Lungs/Pleura: No pneumothorax. No pleural effusion. Basilar right lower lobe 6 mm pulmonary nodule (series 11/ image  75). No acute consolidative airspace disease, additional significant pulmonary nodules or lung masses. Mild atelectasis in the dependent lower lobes. Upper abdomen: Unremarkable. Musculoskeletal: There is an expansile lytic destructive bone mass in the posterior lateral left seventh rib measuring approximately 5.2 x 2.6 cm, which demonstrates mass-effect on the adjacent left pleural space, and is associated with a nondisplaced pathologic fracture. No additional aggressive appearing focal osseous lesions. Moderate to severe degenerative changes in the thoracic spine. Review of the MIP images confirms the above findings. IMPRESSION: 1. No pulmonary embolism. 2. Expansile lytic destructive bone mass in the posterolateral  left seventh rib with associated nondisplaced pathologic fracture. Differential considerations include bone metastasis or plasmacytoma. Consider correlation with CT-guided biopsy. 3. Solitary 6 mm pulmonary nodule in the basilar right lower lobe, indeterminate. Recommend attention on follow-up chest CT in 3 months. 4. Ascending thoracic aortic 4.2 cm aneurysm. Recommend annual imaging followup by CTA or MRA. This recommendation follows 2010 ACCF/AHA/AATS/ACR/ASA/SCA/SCAI/SIR/STS/SVM Guidelines for the Diagnosis and Management of Patients with Thoracic Aortic Disease. Circulation. 2010; 121: [id number] 5. Three-vessel coronary atherosclerosis. Electronically Signed   By: Ilona Sorrel M.D.   On: 03/07/2016 19:06   Nm Pet Image Initial (pi) Skull Base To Thigh  03/20/2016  CLINICAL DATA:  Initial treatment strategy for rib lesion. EXAM: NUCLEAR MEDICINE PET SKULL BASE TO THIGH TECHNIQUE: 9.1 mCi F-18 FDG was injected intravenously. Full-ring PET imaging was performed from the skull base to thigh after the radiotracer. CT data was obtained and used for attenuation correction and anatomic localization. FASTING BLOOD GLUCOSE:  Value: 107 mg/dl COMPARISON:  Chest CT 03/07/2016. FINDINGS: NECK Hypermetabolic soft tissue noted immediately medial to the angle of the mandible, extending anteriorly, measuring approximately 5.1 x 1.9 cm (image 31 of series 4) demonstrates diffuse hypermetabolism (SUVmax = 16.8). This appears to represent an enlarged right submandibular gland, but the activity tracking anteriorly appears to track along the expected location of the duct. A portion of this soft tissue extends caudally, although this appears to be glandular tissue (rather than lymphadenopathy). No definite hypermetabolic cervical lymph nodes are noted. CHEST No hypermetabolic mediastinal or hilar nodes. 6 mm right lower lobe pulmonary nodule (image 110 of series 4) is noted, but highly nonspecific. No other suspicious  hypermetabolic pulmonary nodules on the CT scan. Heart size is borderline enlarged. There is no significant pericardial fluid, thickening or pericardial calcification. There is atherosclerosis of the thoracic aorta, the great vessels of the mediastinum and the coronary arteries, including calcified atherosclerotic plaque in the left main, left anterior descending, left circumflex and right coronary arteries. Mild calcifications of the aortic valve. Ectasia of the ascending thoracic aorta (4.3 cm in diameter). ABDOMEN/PELVIS No abnormal hypermetabolic activity within the liver, pancreas, adrenal glands, or spleen. No hypermetabolic lymph nodes in the abdomen or pelvis. Two low-attenuation lesions are noted in the kidneys bilaterally measuring up to 1.7 cm in the anterior aspect of the interpolar region of the left kidney, which are incompletely characterized on today's noncontrast CT examination, but demonstrate no hypermetabolism, favored to represent small cysts. Atherosclerosis throughout the abdominal and pelvic vasculature, without evidence of aneurysm. No pathologic dilatation of small bowel or colon. No significant volume of ascites. No pneumoperitoneum. SKELETON The lesion of concern in the posterolateral aspect of the left seventh rib appears very similar to the recent chest CT measuring approximately 7.0 x 2.6 cm and is lytic and hypermetabolic (SUVmax = 0000000), with a nondisplaced pathologic fracture. No other osseous lesions are noted. IMPRESSION: 1. Expansile  lytic lesion in the posterolateral aspect of the left seventh rib with associated pathologic fracture demonstrates hypermetabolism, compatible with malignancy. Primary differential considerations include a metastatic lesion or focus of myeloma. 2. The only other suspicious area on today's study involves the right submandibular gland which is enlarged and diffusely hypermetabolic. Some of this hypermetabolism appears to track anteriorly along the  expected course of the duct for the gland. These findings could be seen in the setting of primary neoplasm of the submandibular gland, or could simply represent obstruction of the duct and hypermetabolism in the gland secondary to inflammation/infection. Alternatively, the possibility of a sublingual tumor obstructing the submandibular duct should be considered. Further evaluation by Otolaryngology is recommended in the near future. 3. Atherosclerosis, including left main and 3 vessel coronary artery disease. Assessment for potential risk factor modification, dietary therapy or pharmacologic therapy may be warranted, if clinically indicated. 4. In addition, there is ectasia of the ascending thoracic aorta (4.3 cm in diameter). Recommend annual imaging followup by CTA or MRA. This recommendation follows 2010 ACCF/AHA/AATS/ACR/ASA/SCA/SCAI/SIR/STS/SVM Guidelines for the Diagnosis and Management of Patients with Thoracic Aortic Disease. Circulation. 2010; 121: [id number]. 5. Additional incidental findings, as above. Electronically Signed   By: Vinnie Langton M.D.   On: 03/20/2016 13:59   US Biopsy  04/02/2016  INDICATION: No known primary, now with indeterminate hypermetabolic right submandibular mass. Please perform ultrasound-guided biopsy for tissue diagnostic purposes EXAM: ULTRASOUND-GUIDED RIGHT SUBMANDIBULAR MASS BIOPSY COMPARISON:  PET-CT - 03/20/2016 MEDICATIONS: None ANESTHESIA/SEDATION: Fentanyl 50 mcg IV; Versed 2.5 mg IV Total Moderate Sedation time: 13 minutes. The patient's level of consciousness and vital signs were monitored continuously by radiology nursing throughout the procedure under my direct supervision. COMPLICATIONS: None immediate. PROCEDURE: Informed written consent was obtained from the patient after a discussion of the risks, benefits and alternatives to treatment. The patient understands and consents the procedure. A timeout was performed prior to the initiation of the procedure.  Ultrasound scanning was performed of the right submandibular region demonstrating an indeterminate mixed echogenic at least 1.8 x 2.6 cm mass correlating with a portion of the hypermetabolic right submandibular mass seen on preceding PET-CT. The procedure was planned. The right upper neck was prepped and draped in the usual sterile fashion. The overlying soft tissues were anesthetized with 1% lidocaine with epinephrine. A 17 gauge, 6.8 cm co-axial needle was advanced into a peripheral aspect of the lesion. This was followed by 5 core biopsies with an 18 gauge core device under direct ultrasound guidance. Superficial hemostasis was obtained with manual compression. Post procedural scanning was negative for definitive area of hemorrhage or additional complication. A dressing was placed. The patient tolerated the procedure well without immediate post procedural complication. IMPRESSION: Technically successful ultrasound guided core needle biopsy of indeterminate hypermetabolic right submandibular gland mass. Electronically Signed   By: Sandi Mariscal M.D.   On: 04/02/2016 15:39   Ct Biopsy  04/02/2016  INDICATION: No known primary, now with indeterminate hypermetabolic mixed lytic and sclerotic mass involving the posterior lateral aspect of the left 7th rib. Please perform CT-guided biopsy for tissue diagnostic purposes. EXAM: CT-GUIDED BONE LESION BIOPSY MEDICATIONS: None ANESTHESIA/SEDATION: Fentanyl 50 mcg IV; Versed 3.5 mg IV Sedation Time: 25 minutes; The patient was continuously monitored during the procedure by the interventional radiology nurse under my direct supervision. COMPLICATIONS: None immediate. PROCEDURE: Informed consent was obtained from the patient following an explanation of the procedure, risks, benefits and alternatives. The patient understands, agrees and consents for the procedure. All  questions were addressed. A time out was performed prior to the initiation of the procedure. The patient was  positioned prone and non-contrast localization CT was performed of the thorax demonstrating the known mixed lytic and sclerotic lesion involving the posterior lateral aspect of the left seventh rib with dominant component measuring approximately 4.1 x 1.8 cm (image 23, series 2). The operative site was prepped and draped in the usual sterile fashion. Under sterile conditions and local anesthesia, a 22 gauge spinal needle was utilized for procedural planning. Next, a 56 gauge coaxial needle was advanced into the peripheral aspect of the mixed lytic and sclerotic mass however despite appropriate positioning, a core biopsy was unable to be obtained with a standard 18 gauge core needle biopsy device secondary to sclerotic nature of the lesion. As such, an 11 gauge coaxial bone biopsy needle was advanced into the posterior aspect of the mixed lytic and sclerotic lesion involving in the left seventh rib. Needle position was confirmed with CT imaging. Initially, a biopsy was obtained with the inner 13 gauge biopsy device (image 31, series 7). Finally, the outer 11 gauge coaxial bone biopsy needle was utilized to obtain an additional core biopsy. The needle was removed intact. Hemostasis was obtained with compression and a dressing was placed. Postprocedural was scanning was negative for evidence of a complication, specifically, no evidence of pneumothorax. The patient tolerated the procedure well without immediate post procedural complication. IMPRESSION: Successful CT guided biopsy of mixed lytic and sclerotic lesion involving the posterior lateral aspect of the left seventh rib. Electronically Signed   By: Sandi Mariscal M.D.   On: 04/02/2016 18:10    ASSESSMENT AND PLAN: This is a very pleasant 81 years old white male recently diagnosed with metastatic adenocarcinoma questionable for mucoepidermoid carcinoma involving the submandibular gland as well as left seventh ribs bone lesion. I had a lengthy discussion with the  patient and his family about his condition. Pathology are stable performing further immunohistochemistry stains to identify the primary source of this malignancy. I will complete the staging workup by ordering a MRI of the brain to rule out brain metastasis. I will see the patient back for follow-up visit in one week for reevaluation and discussion of his treatment options based on the final pathology and staging workup. He was advised to call immediately if he has any concerning symptoms in the interval. The patient voices understanding of current disease status and treatment options and is in agreement with the current care plan.  All questions were answered. The patient knows to call the clinic with any problems, questions or concerns. We can certainly see the patient much sooner if necessary.  I spent 15 minutes counseling the patient face to face. The total time spent in the appointment was 25 minutes.  Disclaimer: This note was dictated with voice recognition software. Similar sounding words can inadvertently be transcribed and may not be corrected upon review.

## 2016-04-08 ENCOUNTER — Encounter: Payer: Self-pay | Admitting: *Deleted

## 2016-04-08 NOTE — Progress Notes (Signed)
Oncology Nurse Navigator Documentation  Oncology Nurse Navigator Flowsheets 04/08/2016  Navigator Encounter Type Letter/Fax/Email  Treatment Phase Pre-Tx/Tx Discussion  Barriers/Navigation Needs Education  Acuity Level 1  Time Spent with Patient 15   Patient's daughter emailed me and I addressed question

## 2016-04-09 ENCOUNTER — Encounter: Payer: Medicare Other | Admitting: Thoracic Surgery (Cardiothoracic Vascular Surgery)

## 2016-04-09 ENCOUNTER — Ambulatory Visit (HOSPITAL_COMMUNITY)
Admission: RE | Admit: 2016-04-09 | Discharge: 2016-04-09 | Disposition: A | Discharge status: Home / Self Care | Payer: Medicare Other | Source: Ambulatory Visit | Attending: Internal Medicine | Admitting: Internal Medicine

## 2016-04-09 DIAGNOSIS — I6782 Cerebral ischemia: Secondary | ICD-10-CM | POA: Insufficient documentation

## 2016-04-09 DIAGNOSIS — R918 Other nonspecific abnormal finding of lung field: Secondary | ICD-10-CM | POA: Diagnosis not present

## 2016-04-09 DIAGNOSIS — R590 Localized enlarged lymph nodes: Secondary | ICD-10-CM | POA: Diagnosis not present

## 2016-04-09 DIAGNOSIS — C760 Malignant neoplasm of head, face and neck: Secondary | ICD-10-CM | POA: Diagnosis not present

## 2016-04-09 MED ORDER — GADOBENATE DIMEGLUMINE 529 MG/ML IV SOLN
10.0000 mL | Freq: Once | INTRAVENOUS | Status: AC | PRN
  Administered 2016-04-09: 8 mL via INTRAVENOUS

## 2016-04-11 ENCOUNTER — Ambulatory Visit (HOSPITAL_BASED_OUTPATIENT_CLINIC_OR_DEPARTMENT_OTHER): Payer: Medicare Other | Admitting: Internal Medicine

## 2016-04-11 ENCOUNTER — Encounter: Payer: Self-pay | Admitting: Internal Medicine

## 2016-04-11 ENCOUNTER — Ambulatory Visit: Payer: Medicare Other | Admitting: Radiation Oncology

## 2016-04-11 ENCOUNTER — Other Ambulatory Visit (HOSPITAL_BASED_OUTPATIENT_CLINIC_OR_DEPARTMENT_OTHER): Payer: Medicare Other

## 2016-04-11 VITALS — BP 144/84 | HR 70 | Temp 97.5°F | Resp 18 | Ht 72.0 in | Wt 182.8 lb

## 2016-04-11 DIAGNOSIS — C7951 Secondary malignant neoplasm of bone: Secondary | ICD-10-CM

## 2016-04-11 DIAGNOSIS — C801 Malignant (primary) neoplasm, unspecified: Secondary | ICD-10-CM | POA: Diagnosis not present

## 2016-04-11 DIAGNOSIS — R918 Other nonspecific abnormal finding of lung field: Secondary | ICD-10-CM

## 2016-04-11 DIAGNOSIS — C770 Secondary and unspecified malignant neoplasm of lymph nodes of head, face and neck: Secondary | ICD-10-CM | POA: Diagnosis not present

## 2016-04-11 DIAGNOSIS — D490 Neoplasm of unspecified behavior of digestive system: Secondary | ICD-10-CM | POA: Insufficient documentation

## 2016-04-11 DIAGNOSIS — R590 Localized enlarged lymph nodes: Secondary | ICD-10-CM

## 2016-04-11 DIAGNOSIS — C089 Malignant neoplasm of major salivary gland, unspecified: Secondary | ICD-10-CM

## 2016-04-11 LAB — COMPREHENSIVE METABOLIC PANEL
ALBUMIN: 3.7 g/dL (ref 3.5–5.0)
ALK PHOS: 81 U/L (ref 40–150)
ALT: 14 U/L (ref 0–55)
ANION GAP: 9 meq/L (ref 3–11)
AST: 18 U/L (ref 5–34)
BILIRUBIN TOTAL: 0.62 mg/dL (ref 0.20–1.20)
BUN: 27.3 mg/dL — ABNORMAL HIGH (ref 7.0–26.0)
CALCIUM: 9.9 mg/dL (ref 8.4–10.4)
CO2: 26 mEq/L (ref 22–29)
CREATININE: 1.6 mg/dL — AB (ref 0.7–1.3)
Chloride: 106 mEq/L (ref 98–109)
EGFR: 40 mL/min/{1.73_m2} — AB (ref >90)
Glucose: 109 mg/dl (ref 70–140)
Potassium: 4.7 mEq/L (ref 3.5–5.1)
Sodium: 141 mEq/L (ref 136–145)
TOTAL PROTEIN: 7.2 g/dL (ref 6.4–8.3)

## 2016-04-11 LAB — CBC WITH DIFFERENTIAL/PLATELET
BASO%: 0.5 % (ref 0.0–2.0)
Basophils Absolute: 0 10*3/uL (ref 0.0–0.1)
EOS ABS: 0.2 10*3/uL (ref 0.0–0.5)
EOS%: 2.3 % (ref 0.0–7.0)
HEMATOCRIT: 40.9 % (ref 38.4–49.9)
HGB: 14.1 g/dL (ref 13.0–17.1)
LYMPH#: 1.5 10*3/uL (ref 0.9–3.3)
LYMPH%: 22 % (ref 14.0–49.0)
MCH: 31.5 pg (ref 27.2–33.4)
MCHC: 34.5 g/dL (ref 32.0–36.0)
MCV: 91.5 fL (ref 79.3–98.0)
MONO#: 0.5 10*3/uL (ref 0.1–0.9)
MONO%: 7.3 % (ref 0.0–14.0)
NEUT%: 67.9 % (ref 39.0–75.0)
NEUTROS ABS: 4.5 10*3/uL (ref 1.5–6.5)
PLATELETS: 206 10*3/uL (ref 140–400)
RBC: 4.47 10*6/uL (ref 4.20–5.82)
RDW: 12.6 % (ref 11.0–14.6)
WBC: 6.6 10*3/uL (ref 4.0–10.3)

## 2016-04-11 MED ORDER — PROCHLORPERAZINE MALEATE 10 MG PO TABS: 10.0000 mg | ORAL_TABLET | Freq: Four times a day (QID) | ORAL | Status: DC | PRN

## 2016-04-11 NOTE — Progress Notes (Signed)
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 Pablo Ledger. 2) systemic chemotherapy with carboplatin for AUC of 5 and paclitaxel 200 MG/M2 every 3 weeks. First dose 05/06/2016.  INTERVAL HISTORY: Jose Frank 81 y.o. male returns to the clinic today for follow-up visit accompanied by his wife, Haynes Dage and his niece Lattie Haw. The patient is feeling fine today with no specific complaints except for soreness on the left seventh rib lesion. He denied having any significant shortness breath but has mild cough with no hemoptysis. He has no nausea or vomiting, no fever or chills. The patient denied having any significant weight loss or night sweats. He has no headache or visual changes. He had a recent MRI of the brain. He is here today for evaluation and discussion of his treatment options.  MEDICAL HISTORY: Past Medical History  Diagnosis Date  . IHD (ischemic heart disease)     Remote PCI to distal RCA and atherectomy of th LAD in 1996; S/P PCI to LCX and RCA in 2010.  Marland Kitchen Hyperlipidemia   . Hypertension   . Renal insufficiency     on Diovan  . Hx of cardiovascular stress test     a.  ETT-Myoview 8/14:  No scar or ischemia, EF 52%, normal study;  b. ETT-Myoview:  EF 55%, no ST changes, no ischemia; Normal   . History of echocardiogram     Echo 8/14:  EF 55-60%, Gr 1 DD  . Submandibular lymphadenopathy 03/21/2016  . Cancer ([place]) 1977    tonsil  . Metastatic adenocarcinoma ([place]) 04/04/2016    ALLERGIES:  is allergic to  demerol.  MEDICATIONS:  Current Outpatient Prescriptions  Medication Sig Dispense Refill  . amLODipine (NORVASC) 5 MG tablet Take 1 tablet by mouth  daily (Patient taking differently: Take 5 mg by mouth  daily) 90 tablet 1  . aspirin 81 MG tablet Take 81 mg by mouth daily.      . clopidogrel (PLAVIX) 75 MG tablet Take 1 tablet by mouth  daily (Patient taking differently: Take 75 mg by mouth  daily) 90 tablet 1  . diphenhydramine-acetaminophen (TYLENOL PM) 25-500 MG TABS tablet Take 1 tablet by mouth at bedtime as needed.    Marland Kitchen GLUCOSAMINE-CHONDROITIN PO Take 1 tablet by mouth 2 (two) times daily. (1500 mg/1200 mg)    . metoprolol (LOPRESSOR) 50 MG tablet Take 25-50 mg by mouth 2 (two) times daily. TAKE 50 MG BY MOUTH IN THE AM AND TAKE 25 MG BY MOUTH IN THE PM    . Multiple Vitamin (MULTIVITAMIN) tablet Take 1 tablet by mouth daily.      Marland Kitchen MYRBETRIQ 50 MG TB24 tablet     . nitroGLYCERIN (NITROSTAT) 0.4 MG SL tablet Place 1 tablet (0.4 mg total) under the tongue every 5 (five) minutes as needed. (Patient not taking: Reported on 03/21/2016) 25 tablet 2  . rosuvastatin (CRESTOR) 10 MG tablet Take 10 mg by mouth daily.    . tamsulosin (FLOMAX) 0.4 MG CAPS capsule Take 0.4 mg by mouth every evening.   11   No current facility-administered  medications for this visit.    SURGICAL HISTORY:  Past Surgical History  Procedure Laterality Date  . Cardiac catheterization  09/13/2009  . Cardiac catheterization  09/04/2009    EF 60%  . Tonsillectomy    . Pci  08/2009    TO THE LCX AND RCA  . Cardiovascular stress test  12/08/2007    EF 57%    REVIEW OF SYSTEMS:  Constitutional: negative Eyes: negative Ears, nose, mouth, throat, and face: negative Respiratory: positive for pleurisy/chest pain Cardiovascular: negative Gastrointestinal: negative Genitourinary:negative Integument/breast: negative Hematologic/lymphatic: negative Musculoskeletal:negative Neurological: negative Behavioral/Psych:  negative Endocrine: negative Allergic/Immunologic: negative   PHYSICAL EXAMINATION: General appearance: alert, cooperative and no distress Head: Normocephalic, without obvious abnormality, atraumatic Neck: no carotid bruit, no JVD, supple, symmetrical, trachea midline, thyroid not enlarged, symmetric, no tenderness/mass/nodules and large submandibular mass Lymph nodes: large submandibular mass Resp: clear to auscultation bilaterally Back: symmetric, no curvature. ROM normal. No CVA tenderness. Cardio: regular rate and rhythm, S1, S2 normal, no murmur, click, rub or gallop GI: soft, non-tender; bowel sounds normal; no masses,  no organomegaly Extremities: extremities normal, atraumatic, no cyanosis or edema Neurologic: Alert and oriented X 3, normal strength and tone. Normal symmetric reflexes. Normal coordination and gait  ECOG PERFORMANCE STATUS: 1 - Symptomatic but completely ambulatory  Blood pressure 144/84, pulse 70, temperature 97.5 F (36.4 C), temperature source Oral, resp. rate 18, height 6' (1.829 m), weight 182 lb 12.8 oz (82.918 kg), SpO2 100 %.  LABORATORY DATA: Lab Results  Component Value Date   WBC 6.6 04/11/2016   HGB 14.1 04/11/2016   HCT 40.9 04/11/2016   MCV 91.5 04/11/2016   PLT 206 04/11/2016      Chemistry      Component Value Date/Time   NA 143 04/02/2016 1120   NA 142 03/21/2016 1225   K 4.3 04/02/2016 1120   K 4.8 03/21/2016 1225   CL 106 04/02/2016 1120   CO2 27 04/02/2016 1120   CO2 26 03/21/2016 1225   BUN 25* 04/02/2016 1120   BUN 21.8 03/21/2016 1225   CREATININE 1.50* 04/02/2016 1120   CREATININE 1.6* 03/21/2016 1225      Component Value Date/Time   CALCIUM 9.6 04/02/2016 1120   CALCIUM 9.7 03/21/2016 1225   ALKPHOS 86 03/21/2016 1225   ALKPHOS 79 03/07/2016 1708   AST 18 03/21/2016 1225   AST 21 03/07/2016 1708   ALT 13 03/21/2016 1225   ALT 20 03/07/2016 1708   BILITOT 0.38 03/21/2016 1225   BILITOT 0.7 03/07/2016 1708        RADIOGRAPHIC STUDIES: Mr Jeri Cos Wo Contrast  04-13-2016  CLINICAL DATA:  Metastatic adenocarcinoma of unknown primary involving the right submandibular gland and left seventh rib diagnosed 03/2016. Staging. EXAM: MRI HEAD WITHOUT AND WITH CONTRAST TECHNIQUE: Multiplanar, multiecho pulse sequences of the brain and surrounding structures were obtained without and with intravenous contrast. CONTRAST:  75mL MULTIHANCE GADOBENATE DIMEGLUMINE 529 MG/ML IV SOLN COMPARISON:  Head CT 11/09/2014 FINDINGS: There is no evidence of acute infarct, intracranial hemorrhage, mass, midline shift, or extra-axial fluid collection. There is mild cerebral atrophy. Small foci of T2 hyperintensity in the cerebral white matter bilaterally are nonspecific but compatible with mild chronic small vessel ischemic disease. No abnormal brain parenchymal or meningeal enhancement is identified. Masslike soft tissue in the right submandibular region is partially visualized as described on prior PET-CT. There is atherosclerotic disease of the distal left vertebral artery. Major intracranial vascular flow voids are otherwise preserved. The  no significant paranasal sinus or mastoid air cell inflammatory change is seen. No suspicious skull lesion is identified. IMPRESSION: 1. No evidence of intracranial metastases. 2. Mild chronic small vessel ischemic disease. Electronically Signed   By: Logan Bores M.D.   On: 04/09/2016 17:15   Nm Pet Image Initial (pi) Skull Base To Thigh  03/20/2016  CLINICAL DATA:  Initial treatment strategy for rib lesion. EXAM: NUCLEAR MEDICINE PET SKULL BASE TO THIGH TECHNIQUE: 9.1 mCi F-18 FDG was injected intravenously. Full-ring PET imaging was performed from the skull base to thigh after the radiotracer. CT data was obtained and used for attenuation correction and anatomic localization. FASTING BLOOD GLUCOSE:  Value: 107 mg/dl COMPARISON:  Chest CT 03/07/2016. FINDINGS: NECK Hypermetabolic soft tissue noted  immediately medial to the angle of the mandible, extending anteriorly, measuring approximately 5.1 x 1.9 cm (image 31 of series 4) demonstrates diffuse hypermetabolism (SUVmax = 16.8). This appears to represent an enlarged right submandibular gland, but the activity tracking anteriorly appears to track along the expected location of the duct. A portion of this soft tissue extends caudally, although this appears to be glandular tissue (rather than lymphadenopathy). No definite hypermetabolic cervical lymph nodes are noted. CHEST No hypermetabolic mediastinal or hilar nodes. 6 mm right lower lobe pulmonary nodule (image 110 of series 4) is noted, but highly nonspecific. No other suspicious hypermetabolic pulmonary nodules on the CT scan. Heart size is borderline enlarged. There is no significant pericardial fluid, thickening or pericardial calcification. There is atherosclerosis of the thoracic aorta, the great vessels of the mediastinum and the coronary arteries, including calcified atherosclerotic plaque in the left main, left anterior descending, left circumflex and right coronary arteries. Mild calcifications of the aortic valve. Ectasia of the ascending thoracic aorta (4.3 cm in diameter). ABDOMEN/PELVIS No abnormal hypermetabolic activity within the liver, pancreas, adrenal glands, or spleen. No hypermetabolic lymph nodes in the abdomen or pelvis. Two low-attenuation lesions are noted in the kidneys bilaterally measuring up to 1.7 cm in the anterior aspect of the interpolar region of the left kidney, which are incompletely characterized on today's noncontrast CT examination, but demonstrate no hypermetabolism, favored to represent small cysts. Atherosclerosis throughout the abdominal and pelvic vasculature, without evidence of aneurysm. No pathologic dilatation of small bowel or colon. No significant volume of ascites. No pneumoperitoneum. SKELETON The lesion of concern in the posterolateral aspect of the left  seventh rib appears very similar to the recent chest CT measuring approximately 7.0 x 2.6 cm and is lytic and hypermetabolic (SUVmax = 0000000), with a nondisplaced pathologic fracture. No other osseous lesions are noted. IMPRESSION: 1. Expansile lytic lesion in the posterolateral aspect of the left seventh rib with associated pathologic fracture demonstrates hypermetabolism, compatible with malignancy. Primary differential considerations include a metastatic lesion or focus of myeloma. 2. The only other suspicious area on today's study involves the right submandibular gland which is enlarged and diffusely hypermetabolic. Some of this hypermetabolism appears to track anteriorly along the expected course of the duct for the gland. These findings could be seen in the setting of primary neoplasm of the submandibular gland, or could simply represent obstruction of the duct and hypermetabolism in the gland secondary to inflammation/infection. Alternatively, the possibility of a sublingual tumor obstructing the submandibular duct should be considered. Further evaluation by Otolaryngology is recommended in the near future. 3. Atherosclerosis, including left main and 3 vessel coronary artery disease. Assessment for potential risk factor modification, dietary therapy or pharmacologic therapy may be warranted, if clinically  indicated. 4. In addition, there is ectasia of the ascending thoracic aorta (4.3 cm in diameter). Recommend annual imaging followup by CTA or MRA. This recommendation follows 2010 ACCF/AHA/AATS/ACR/ASA/SCA/SCAI/SIR/STS/SVM Guidelines for the Diagnosis and Management of Patients with Thoracic Aortic Disease. Circulation. 2010; 121: [id number]. 5. Additional incidental findings, as above. Electronically Signed   By: Vinnie Langton M.D.   On: 03/20/2016 13:59   US Biopsy  04/02/2016  INDICATION: No known primary, now with indeterminate hypermetabolic right submandibular mass. Please perform ultrasound-guided  biopsy for tissue diagnostic purposes EXAM: ULTRASOUND-GUIDED RIGHT SUBMANDIBULAR MASS BIOPSY COMPARISON:  PET-CT - 03/20/2016 MEDICATIONS: None ANESTHESIA/SEDATION: Fentanyl 50 mcg IV; Versed 2.5 mg IV Total Moderate Sedation time: 13 minutes. The patient's level of consciousness and vital signs were monitored continuously by radiology nursing throughout the procedure under my direct supervision. COMPLICATIONS: None immediate. PROCEDURE: Informed written consent was obtained from the patient after a discussion of the risks, benefits and alternatives to treatment. The patient understands and consents the procedure. A timeout was performed prior to the initiation of the procedure. Ultrasound scanning was performed of the right submandibular region demonstrating an indeterminate mixed echogenic at least 1.8 x 2.6 cm mass correlating with a portion of the hypermetabolic right submandibular mass seen on preceding PET-CT. The procedure was planned. The right upper neck was prepped and draped in the usual sterile fashion. The overlying soft tissues were anesthetized with 1% lidocaine with epinephrine. A 17 gauge, 6.8 cm co-axial needle was advanced into a peripheral aspect of the lesion. This was followed by 5 core biopsies with an 18 gauge core device under direct ultrasound guidance. Superficial hemostasis was obtained with manual compression. Post procedural scanning was negative for definitive area of hemorrhage or additional complication. A dressing was placed. The patient tolerated the procedure well without immediate post procedural complication. IMPRESSION: Technically successful ultrasound guided core needle biopsy of indeterminate hypermetabolic right submandibular gland mass. Electronically Signed   By: Sandi Mariscal M.D.   On: 04/02/2016 15:39   Ct Biopsy  04/02/2016  INDICATION: No known primary, now with indeterminate hypermetabolic mixed lytic and sclerotic mass involving the posterior lateral aspect of  the left 7th rib. Please perform CT-guided biopsy for tissue diagnostic purposes. EXAM: CT-GUIDED BONE LESION BIOPSY MEDICATIONS: None ANESTHESIA/SEDATION: Fentanyl 50 mcg IV; Versed 3.5 mg IV Sedation Time: 25 minutes; The patient was continuously monitored during the procedure by the interventional radiology nurse under my direct supervision. COMPLICATIONS: None immediate. PROCEDURE: Informed consent was obtained from the patient following an explanation of the procedure, risks, benefits and alternatives. The patient understands, agrees and consents for the procedure. All questions were addressed. A time out was performed prior to the initiation of the procedure. The patient was positioned prone and non-contrast localization CT was performed of the thorax demonstrating the known mixed lytic and sclerotic lesion involving the posterior lateral aspect of the left seventh rib with dominant component measuring approximately 4.1 x 1.8 cm (image 23, series 2). The operative site was prepped and draped in the usual sterile fashion. Under sterile conditions and local anesthesia, a 22 gauge spinal needle was utilized for procedural planning. Next, a 61 gauge coaxial needle was advanced into the peripheral aspect of the mixed lytic and sclerotic mass however despite appropriate positioning, a core biopsy was unable to be obtained with a standard 18 gauge core needle biopsy device secondary to sclerotic nature of the lesion. As such, an 11 gauge coaxial bone biopsy needle was advanced into the posterior aspect of  the mixed lytic and sclerotic lesion involving in the left seventh rib. Needle position was confirmed with CT imaging. Initially, a biopsy was obtained with the inner 13 gauge biopsy device (image 31, series 7). Finally, the outer 11 gauge coaxial bone biopsy needle was utilized to obtain an additional core biopsy. The needle was removed intact. Hemostasis was obtained with compression and a dressing was placed.  Postprocedural was scanning was negative for evidence of a complication, specifically, no evidence of pneumothorax. The patient tolerated the procedure well without immediate post procedural complication. IMPRESSION: Successful CT guided biopsy of mixed lytic and sclerotic lesion involving the posterior lateral aspect of the left seventh rib. Electronically Signed   By: Sandi Mariscal M.D.   On: 04/02/2016 18:10    ASSESSMENT AND PLAN: This is a very pleasant 81 years old white male recently diagnosed with metastatic adenocarcinoma questionable for mucoepidermoid carcinoma involving the submandibular gland as well as left seventh ribs bone lesion. The recent MRI of the brain showed no evidence of metastatic disease to the brain. I had a lengthy discussion with the patient and his family about his current disease stage, prognosis and treatment options. His case was discussed at the weekly thoracic conference. He will benefit from palliative radiotherapy to the left seventh rib and I will refer the patient back to Dr. Pablo Ledger for consideration of palliative radiotherapy. I also discussed with the patient systemic chemotherapy treatment with carboplatin for AUC of 5 and paclitaxel 200 MG/M2 every 3 weeks with Neulasta support. We will start the first cycle of this treatment after completion of his palliative radiation. I discussed with the patient adverse effect of this treatment including but not limited to alopecia, myelosuppression, nausea and vomiting, peripheral neuropathy, liver or renal dysfunction. I will call his pharmacy with prescription for Compazine 10 mg by mouth every 6 hours as needed for nausea. He would come back for follow-up visit in 3 weeks with the start of cycle #1 of his treatment. I will arrange for the patient to have a chemotherapy education class before starting the first dose of his treatment. He was advised to call immediately if he has any concerning symptoms in the  interval. The patient voices understanding of current disease status and treatment options and is in agreement with the current care plan.  All questions were answered. The patient knows to call the clinic with any problems, questions or concerns. We can certainly see the patient much sooner if necessary.  I spent 15 minutes counseling the patient face to face. The total time spent in the appointment was 25 minutes.  Disclaimer: This note was dictated with voice recognition software. Similar sounding words can inadvertently be transcribed and may not be corrected upon review.

## 2016-04-12 ENCOUNTER — Telehealth: Payer: Self-pay | Admitting: Internal Medicine

## 2016-04-12 ENCOUNTER — Telehealth: Payer: Self-pay | Admitting: *Deleted

## 2016-04-12 NOTE — Telephone Encounter (Signed)
cld& spoke to pt and gave pt time & date of appt for chemo class on 5/11&12:30

## 2016-04-12 NOTE — Telephone Encounter (Signed)
Oncology Nurse Navigator Documentation  Oncology Nurse Navigator Flowsheets 04/12/2016  Navigator Encounter Type Telephone  Telephone Outgoing Call  Treatment Phase Pre-Tx/Tx Discussion  Barriers/Navigation Needs Coordination of Care  Interventions Coordination of Care  Coordination of Care Appts  Acuity Level 1  Time Spent with Patient 15   Called patient today to update him on his appt with Dr. Pablo Ledger.  I was unable to reach or leave a vm message. Will mail him appt schedule today.

## 2016-04-15 ENCOUNTER — Telehealth: Payer: Self-pay | Admitting: *Deleted

## 2016-04-15 NOTE — Telephone Encounter (Signed)
Per staff message and POF I have scheduled appts. Advised scheduler of appts. JMW  

## 2016-04-17 NOTE — Progress Notes (Addendum)
  Current Complaints / other details:   No chief complaint on file.  Histology and Location of Primary Cancer: unknown primary  Bone, biopsy, left 7th rib  04-02-16 - CARCINOMA. - SEE MICROSCOPIC DESCRIPTION.  Sites of Visceral and Bony Metastatic Disease: Left seventh rib and mucoepidermoid carcinoma involving the submandilbular   Location(s) of Symptomatic Metastases: Left seventh rib  Past/Anticipated chemotherapy by medical oncology, if KCM:[id number] for AUC of 5 and paclitaxel 200 MG/M2 every 3 weeks. First dose 05/06/2016. Referred for palliative radiation therapy to the left seventh rib Pain on a scale of 0-10 is:No  If Spine Met(s), symptoms, if any, include:  Bowel/Bladder retention or incontinence (please describe):No  Numbness or weakness in extremities (please describe): No  Current Decadron regimen, if applicable: None  Ambulatory status? Walker? Wheelchair?: No  SAFETY ISSUES:  Prior radiation? :Radiation in the 70's  Had 28 treatments  To jaws [place]  Pacemaker/ICD? : No  Possible current pregnancy? : No  Is the patient on methotrexate?:No Has 5 cardiac stents Current Complaints / other details: Here to discuss radiation treatment plan BP 126/82 mmHg  Pulse 60  Temp(Src) 98 F (36.7 C) (Oral)  Resp 18  Ht 6" (0.152 m)  Wt 184 lb 4.8 oz (83.598 kg)  BMI 3618.33 kg/m2  SpO2 100%

## 2016-04-18 ENCOUNTER — Encounter: Payer: Self-pay | Admitting: *Deleted

## 2016-04-18 ENCOUNTER — Ambulatory Visit: Payer: Medicare Other | Admitting: Radiation Oncology

## 2016-04-18 ENCOUNTER — Encounter: Payer: Self-pay | Admitting: Radiation Oncology

## 2016-04-18 ENCOUNTER — Ambulatory Visit
Admission: RE | Admit: 2016-04-18 | Discharge: 2016-04-18 | Disposition: A | Discharge status: Home / Self Care | Payer: Medicare Other | Source: Ambulatory Visit | Attending: Radiation Oncology | Admitting: Radiation Oncology

## 2016-04-18 ENCOUNTER — Other Ambulatory Visit: Payer: Medicare Other

## 2016-04-18 VITALS — BP 126/82 | HR 60 | Temp 98.0°F | Resp 18 | Ht <= 58 in | Wt 184.3 lb

## 2016-04-18 DIAGNOSIS — C7951 Secondary malignant neoplasm of bone: Secondary | ICD-10-CM | POA: Diagnosis present

## 2016-04-18 DIAGNOSIS — C089 Malignant neoplasm of major salivary gland, unspecified: Secondary | ICD-10-CM

## 2016-04-18 DIAGNOSIS — Z510 Encounter for antineoplastic radiation therapy: Secondary | ICD-10-CM | POA: Diagnosis not present

## 2016-04-18 NOTE — Progress Notes (Signed)
   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  Interval History: Jose Frank presents today for routine followup. The patient presented to the clinic on 03/21/16 to discuss treatment to his hypermetabolic submandibular mass and hypermetabolic expansile lytic lesion of the left seventh rib. Biopsy of the left seventh rib lesion on 04/02/16 revealed metastatic adenocarcinoma. The tumor was negative for PSA and prostein. Biopsy of the right submandibular mass the same day was also strongly suggestive of adenocarcinoma. Immunohistochemistry showed strong positivity with cytokeratin 7 and cytokeratin 5/6 with negative staining for CDX2, cytokeratin 20, Napsin A, thyroid transcription factor-1, thyroglobulin, p63 and mucicarmine. Both are questionable for mucoepidermoid carcinoma.  MRI of the brain on 04/09/16 showed no metastatic disease. The patient returned to Dr. Julien Nordmann to discuss systemic treatment. The patient presents today to discuss palliative radiation to the left seventh rib lesion.  He denies left flank pain during the day, but reports it starts to hurt at night and causes problems sleeping.  Physical Exam:  Filed Vitals:   04/18/16 1343  BP: 126/82  Pulse: 60  Temp: 98 F (36.7 C)  TempSrc: Oral  Resp: 18  Height: 6" (0.152 m)  Weight: 184 lb 4.8 oz (83.598 kg)  SpO2: 100%  Alert and oriented x 3. In no acute distress.  IMPRESSION: Jose Frank is a 81 y.o. 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  PLAN: I have offered him palliative radiation to his rib. Alternatives to this would be  surgery and/or chemotherapy. We discussed skin redness and fatigue as possible side effects. The patient signed a consent form and this was placed in his medical chart.   CT simulation is scheduled after this encounter. We anticipate 10 fractions of palliative radiation to the left seventh rib. After radiation, we expect Dr. Julien Nordmann to administer systemic chemotherapy with carboplatin for AUC of 5 and paclitaxel 200 MG/M2 every 3 weeks with Neulasta support. His first dose of chemotherapy is expected to begin on 05/06/2016.  The patient's case will be presented to head and neck  tumor board next week.  Thea Silversmith, MD  This document serves as a record of services personally performed by Thea Silversmith, MD. It was created on her behalf by Darcus Austin, a trained medical scribe. The creation of this record is based on the scribe's personal observations and the provider's statements to them. This document has been checked and approved by the attending provider.

## 2016-04-18 NOTE — Progress Notes (Signed)
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   NARRATIVE: The patient was brought to the [facility].  Identity was confirmed.  All relevant records and images related to the planned course of therapy were reviewed.  Then, the patient was positioned in a stable reproducible clinical set-up for radiation therapy.  CT images were obtained.  Skin markings were placed.  The CT images were loaded into the planning software where the target and avoidance structures were contoured.  The radiation prescription was entered and confirmed.   TREATMENT PLANNING NOTE:  Treatment planning then occurred. I have requested 3D simulation with [facility] of the spinal cord, total lungs and gross tumor volume. I have also requested mlcs and an isodose plan.   A total of 3 complex treatment devices will be utilized in the form of unique MLCs.  ------------------------------------------------  Thea Silversmith, MD  This document serves as a record of services personally performed by Thea Silversmith, MD. It was created on her behalf by Darcus Austin, a trained medical scribe. The creation of this record is based on the scribe's personal observations and the provider's statements to them. This document has been checked and approved by the attending provider.

## 2016-04-19 ENCOUNTER — Encounter: Payer: Self-pay | Admitting: *Deleted

## 2016-04-19 NOTE — Addendum Note (Signed)
Encounter addended by: Malena Edman, RN on: 04/19/2016  5:10 PM<BR>     Documentation filed: Charges VN

## 2016-04-23 ENCOUNTER — Ambulatory Visit
Admission: RE | Admit: 2016-04-23 | Discharge: 2016-04-23 | Disposition: A | Discharge status: Home / Self Care | Payer: Medicare Other | Source: Ambulatory Visit | Attending: Radiation Oncology | Admitting: Radiation Oncology

## 2016-04-23 ENCOUNTER — Encounter: Payer: Self-pay | Admitting: Radiation Oncology

## 2016-04-23 DIAGNOSIS — Z510 Encounter for antineoplastic radiation therapy: Secondary | ICD-10-CM | POA: Diagnosis not present

## 2016-04-23 DIAGNOSIS — C7951 Secondary malignant neoplasm of bone: Secondary | ICD-10-CM | POA: Diagnosis not present

## 2016-04-23 DIAGNOSIS — C089 Malignant neoplasm of major salivary gland, unspecified: Secondary | ICD-10-CM | POA: Diagnosis not present

## 2016-04-23 NOTE — Progress Notes (Signed)
Weekly Management Note Current Dose: 3  Gy  Projected Dose: 30 Gy   Narrative:  The patient presents for routine under treatment assessment.  CBCT/MVCT images/Port film x-rays were reviewed.  The chart was checked.Doing well. No complaints.   Physical Findings: Weight:  . Unchanged  Impression:  The patient is tolerating radiation.  Plan:  Continue treatment as planned. Will discuss at tumor board tomorrow morning.   ------------------------------------------------  Thea Silversmith, MD  This document serves as a record of services personally performed by Thea Silversmith, MD. It was created on her behalf by Derek Mound, a trained medical scribe. The creation of this record is based on the scribe's personal observations and the provider's statements to them. This document has been checked and approved by the attending provider.

## 2016-04-23 NOTE — Progress Notes (Signed)
Weight and vitals stable. Denies pain. Reports an intermittent dry cough unchanged with frequency or intensity. Denies difficult or painful swallowing. Denies SOB. Denies any complaints. Reports eating and sleeping without difficulty.   BP 135/80 mmHg  Pulse 66  Resp 16  Wt 182 lb 11.2 oz (82.872 kg)  SpO2 98% Wt Readings from Last 3 Encounters:  04/23/16 182 lb 11.2 oz (82.872 kg)  04/18/16 184 lb 4.8 oz (83.598 kg)  04/11/16 182 lb 12.8 oz (82.918 kg)

## 2016-04-24 ENCOUNTER — Ambulatory Visit
Admission: RE | Admit: 2016-04-24 | Discharge: 2016-04-24 | Disposition: A | Discharge status: Home / Self Care | Payer: Medicare Other | Source: Ambulatory Visit | Attending: Radiation Oncology | Admitting: Radiation Oncology

## 2016-04-24 DIAGNOSIS — C7951 Secondary malignant neoplasm of bone: Secondary | ICD-10-CM | POA: Diagnosis not present

## 2016-04-24 DIAGNOSIS — C089 Malignant neoplasm of major salivary gland, unspecified: Secondary | ICD-10-CM | POA: Diagnosis not present

## 2016-04-24 DIAGNOSIS — Z510 Encounter for antineoplastic radiation therapy: Secondary | ICD-10-CM | POA: Diagnosis not present

## 2016-04-25 ENCOUNTER — Ambulatory Visit
Admission: RE | Admit: 2016-04-25 | Discharge: 2016-04-25 | Disposition: A | Discharge status: Home / Self Care | Payer: Medicare Other | Source: Ambulatory Visit | Attending: Radiation Oncology | Admitting: Radiation Oncology

## 2016-04-25 DIAGNOSIS — C7951 Secondary malignant neoplasm of bone: Secondary | ICD-10-CM | POA: Diagnosis not present

## 2016-04-25 DIAGNOSIS — Z510 Encounter for antineoplastic radiation therapy: Secondary | ICD-10-CM | POA: Diagnosis not present

## 2016-04-25 DIAGNOSIS — C089 Malignant neoplasm of major salivary gland, unspecified: Secondary | ICD-10-CM | POA: Diagnosis not present

## 2016-04-26 ENCOUNTER — Ambulatory Visit
Admission: RE | Admit: 2016-04-26 | Discharge: 2016-04-26 | Disposition: A | Discharge status: Home / Self Care | Payer: Medicare Other | Source: Ambulatory Visit | Attending: Radiation Oncology | Admitting: Radiation Oncology

## 2016-04-26 DIAGNOSIS — C7951 Secondary malignant neoplasm of bone: Secondary | ICD-10-CM | POA: Diagnosis not present

## 2016-04-26 DIAGNOSIS — C089 Malignant neoplasm of major salivary gland, unspecified: Secondary | ICD-10-CM | POA: Diagnosis not present

## 2016-04-26 DIAGNOSIS — Z510 Encounter for antineoplastic radiation therapy: Secondary | ICD-10-CM | POA: Diagnosis not present

## 2016-04-26 NOTE — Progress Notes (Signed)
Pt here for patient teaching.  Pt given Radiation and You booklet. Pt reports they have not watched the Radiation Therapy Education video and has been given the link to watch at home.  Reviewed areas of pertinence such as fatigue and skin changes . Pt able to give teach back of to pat skin and use unscented/gentle soap,avoid applying anything to skin within 4 hours of treatment. Pt demonstrated understanding and verbalizes understanding of information given and will contact nursing with any questions or concerns.

## 2016-04-29 ENCOUNTER — Ambulatory Visit
Admission: RE | Admit: 2016-04-29 | Discharge: 2016-04-29 | Disposition: A | Discharge status: Home / Self Care | Payer: Medicare Other | Source: Ambulatory Visit | Attending: Radiation Oncology | Admitting: Radiation Oncology

## 2016-04-29 DIAGNOSIS — C089 Malignant neoplasm of major salivary gland, unspecified: Secondary | ICD-10-CM | POA: Diagnosis not present

## 2016-04-29 DIAGNOSIS — C7951 Secondary malignant neoplasm of bone: Secondary | ICD-10-CM | POA: Diagnosis not present

## 2016-04-29 DIAGNOSIS — C799 Secondary malignant neoplasm of unspecified site: Secondary | ICD-10-CM

## 2016-04-29 DIAGNOSIS — Z510 Encounter for antineoplastic radiation therapy: Secondary | ICD-10-CM | POA: Diagnosis not present

## 2016-04-29 MED ORDER — SONAFINE EX EMUL
1.0000 "application " | Freq: Two times a day (BID) | CUTANEOUS | Status: DC
  Administered 2016-04-29: 1 via TOPICAL
  Filled 2016-04-29: qty 45

## 2016-04-29 MED ORDER — SONAFINE EX EMUL
1.0000 "application " | Freq: Two times a day (BID) | CUTANEOUS | Status: DC
  Filled 2016-04-29: qty 45

## 2016-04-29 NOTE — Addendum Note (Signed)
Encounter addended by: Malena Edman, RN on: 04/29/2016  8:46 AM<BR>     Documentation filed: Inpatient Patient Education

## 2016-04-30 ENCOUNTER — Encounter: Payer: Self-pay | Admitting: Radiation Oncology

## 2016-04-30 ENCOUNTER — Ambulatory Visit
Admission: RE | Admit: 2016-04-30 | Discharge: 2016-04-30 | Disposition: A | Discharge status: Home / Self Care | Payer: Medicare Other | Source: Ambulatory Visit | Attending: Radiation Oncology | Admitting: Radiation Oncology

## 2016-04-30 VITALS — BP 131/83 | HR 67 | Temp 97.8°F | Ht 72.0 in | Wt 185.6 lb

## 2016-04-30 DIAGNOSIS — C089 Malignant neoplasm of major salivary gland, unspecified: Secondary | ICD-10-CM | POA: Diagnosis not present

## 2016-04-30 DIAGNOSIS — C799 Secondary malignant neoplasm of unspecified site: Secondary | ICD-10-CM

## 2016-04-30 DIAGNOSIS — Z510 Encounter for antineoplastic radiation therapy: Secondary | ICD-10-CM | POA: Diagnosis not present

## 2016-04-30 DIAGNOSIS — C7951 Secondary malignant neoplasm of bone: Secondary | ICD-10-CM | POA: Diagnosis not present

## 2016-04-30 NOTE — Progress Notes (Signed)
Jose Frank is here for his 6th fraction of radiation to his Chest, Rib. He denies any pain at this time. He states the ache he had on his left side has improved since radiation started. He denies any shortness of breath. He does report that 3 hours after each radiation treatment he receives he gets the "chills". They wrap him in a blanket and it will go away after 20- 30 minutes. He is eating well per his report.   BP 131/83 mmHg  Pulse 67  Temp(Src) 97.8 F (36.6 C)  Ht 6' (1.829 m)  Wt 185 lb 9.6 oz (84.188 kg)  BMI 25.17 kg/m2  SpO2 99%  Wt Readings from Last 3 Encounters:  04/30/16 185 lb 9.6 oz (84.188 kg)  04/23/16 182 lb 11.2 oz (82.872 kg)  04/18/16 184 lb 4.8 oz (83.598 kg)

## 2016-04-30 NOTE — Progress Notes (Signed)
Weekly Management Note Current Dose: 18 Gy  Projected Dose: 30 Gy   Narrative:  The patient presents for routine under treatment assessment.  CBCT/MVCT images/Port film x-rays were reviewed.  The chart was checked.   Jose Frank is here for his 6th fraction of radiation to his Chest, Rib. He denies any pain at this time. He states the ache he had on his left side has improved since radiation started. He denies any shortness of breath. He does report that 3 hours after each radiation treatment he receives he gets the "chills". They wrap him in a blanket and it will go away after 20- 30 minutes. He is eating well per his report. He mentions that his ache is completely gone and he is able to sleep on his side.   Physical Findings:  Unchanged  Vitals:  Filed Vitals:   04/30/16 1318  BP: 131/83  Pulse: 67  Temp: 97.8 F (36.6 C)   Weight:  Wt Readings from Last 3 Encounters:  04/30/16 185 lb 9.6 oz (84.188 kg)  04/23/16 182 lb 11.2 oz (82.872 kg)  04/18/16 184 lb 4.8 oz (83.598 kg)   Lab Results  Component Value Date   WBC 6.6 04/11/2016   HGB 14.1 04/11/2016   HCT 40.9 04/11/2016   MCV 91.5 04/11/2016   PLT 206 04/11/2016   Lab Results  Component Value Date   CREATININE 1.6* 04/11/2016   BUN 27.3* 04/11/2016   NA 141 04/11/2016   K 4.7 04/11/2016   CL 106 04/02/2016   CO2 26 04/11/2016     Impression:  The patient is tolerating radiation well with good pain response.   Plan:  Continue treatment as planned. Unclear what is causing chills. ? Tumor lysis syndrome.  Will monitor.     ------------------------------------------------  Jose Silversmith, MD    This document serves as a record of services personally performed by Jose Silversmith, MD. It was created on her behalf by Lendon Collar, a trained medical scribe. The creation of this record is based on the scribe's personal observations and the provider's statements to them. This document has been checked and  approved by the attending provider.

## 2016-05-01 ENCOUNTER — Ambulatory Visit
Admission: RE | Admit: 2016-05-01 | Discharge: 2016-05-01 | Disposition: A | Discharge status: Home / Self Care | Payer: Medicare Other | Source: Ambulatory Visit | Attending: Radiation Oncology | Admitting: Radiation Oncology

## 2016-05-01 DIAGNOSIS — C7951 Secondary malignant neoplasm of bone: Secondary | ICD-10-CM | POA: Diagnosis not present

## 2016-05-01 DIAGNOSIS — C089 Malignant neoplasm of major salivary gland, unspecified: Secondary | ICD-10-CM | POA: Diagnosis not present

## 2016-05-01 DIAGNOSIS — Z510 Encounter for antineoplastic radiation therapy: Secondary | ICD-10-CM | POA: Diagnosis not present

## 2016-05-01 DIAGNOSIS — C799 Secondary malignant neoplasm of unspecified site: Secondary | ICD-10-CM

## 2016-05-02 ENCOUNTER — Ambulatory Visit
Admission: RE | Admit: 2016-05-02 | Discharge: 2016-05-02 | Disposition: A | Discharge status: Home / Self Care | Payer: Medicare Other | Source: Ambulatory Visit | Attending: Radiation Oncology | Admitting: Radiation Oncology

## 2016-05-02 ENCOUNTER — Other Ambulatory Visit (HOSPITAL_BASED_OUTPATIENT_CLINIC_OR_DEPARTMENT_OTHER): Payer: Medicare Other

## 2016-05-02 ENCOUNTER — Ambulatory Visit (HOSPITAL_BASED_OUTPATIENT_CLINIC_OR_DEPARTMENT_OTHER): Payer: Medicare Other | Admitting: Internal Medicine

## 2016-05-02 ENCOUNTER — Encounter: Payer: Self-pay | Admitting: Internal Medicine

## 2016-05-02 VITALS — BP 129/81 | HR 63 | Temp 98.1°F | Resp 18 | Ht 72.0 in | Wt 183.6 lb

## 2016-05-02 DIAGNOSIS — C089 Malignant neoplasm of major salivary gland, unspecified: Secondary | ICD-10-CM

## 2016-05-02 DIAGNOSIS — C7951 Secondary malignant neoplasm of bone: Secondary | ICD-10-CM

## 2016-05-02 DIAGNOSIS — C801 Malignant (primary) neoplasm, unspecified: Secondary | ICD-10-CM | POA: Diagnosis not present

## 2016-05-02 DIAGNOSIS — Z510 Encounter for antineoplastic radiation therapy: Secondary | ICD-10-CM | POA: Diagnosis not present

## 2016-05-02 DIAGNOSIS — C770 Secondary and unspecified malignant neoplasm of lymph nodes of head, face and neck: Secondary | ICD-10-CM | POA: Diagnosis not present

## 2016-05-02 DIAGNOSIS — C081 Malignant neoplasm of sublingual gland: Secondary | ICD-10-CM | POA: Diagnosis not present

## 2016-05-02 DIAGNOSIS — R918 Other nonspecific abnormal finding of lung field: Secondary | ICD-10-CM

## 2016-05-02 LAB — CBC WITH DIFFERENTIAL/PLATELET
BASO%: 0.6 % (ref 0.0–2.0)
Basophils Absolute: 0 10*3/uL (ref 0.0–0.1)
EOS ABS: 0.1 10*3/uL (ref 0.0–0.5)
EOS%: 2.7 % (ref 0.0–7.0)
HCT: 38.6 % (ref 38.4–49.9)
HGB: 13.3 g/dL (ref 13.0–17.1)
LYMPH%: 20.2 % (ref 14.0–49.0)
MCH: 31.6 pg (ref 27.2–33.4)
MCHC: 34.5 g/dL (ref 32.0–36.0)
MCV: 91.7 fL (ref 79.3–98.0)
MONO#: 0.5 10*3/uL (ref 0.1–0.9)
MONO%: 10.6 % (ref 0.0–14.0)
NEUT%: 65.9 % (ref 39.0–75.0)
NEUTROS ABS: 3.2 10*3/uL (ref 1.5–6.5)
Platelets: 194 10*3/uL (ref 140–400)
RBC: 4.21 10*6/uL (ref 4.20–5.82)
RDW: 12.6 % (ref 11.0–14.6)
WBC: 4.9 10*3/uL (ref 4.0–10.3)
lymph#: 1 10*3/uL (ref 0.9–3.3)

## 2016-05-02 LAB — COMPREHENSIVE METABOLIC PANEL
ALBUMIN: 3.4 g/dL — AB (ref 3.5–5.0)
ALK PHOS: 74 U/L (ref 40–150)
ALT: 14 U/L (ref 0–55)
ANION GAP: 8 meq/L (ref 3–11)
AST: 16 U/L (ref 5–34)
BILIRUBIN TOTAL: 0.41 mg/dL (ref 0.20–1.20)
BUN: 23.2 mg/dL (ref 7.0–26.0)
CO2: 27 mEq/L (ref 22–29)
CREATININE: 1.6 mg/dL — AB (ref 0.7–1.3)
Calcium: 9.4 mg/dL (ref 8.4–10.4)
Chloride: 107 mEq/L (ref 98–109)
EGFR: 40 mL/min/{1.73_m2} — AB (ref >90)
GLUCOSE: 90 mg/dL (ref 70–140)
Potassium: 4.6 mEq/L (ref 3.5–5.1)
SODIUM: 141 meq/L (ref 136–145)
TOTAL PROTEIN: 6.8 g/dL (ref 6.4–8.3)

## 2016-05-02 NOTE — Progress Notes (Signed)
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 Pablo Ledger. This is expected to be completed on 05/07/2016. 2) systemic chemotherapy with carboplatin for AUC of 5 and paclitaxel 200 MG/M2 every 3 weeks. First dose 05/08/2016.  INTERVAL HISTORY: Jose Frank 81 y.o. male returns to the clinic today for follow-up visit accompanied by his wife, Haynes Dage. The patient is feeling fine today with no specific complaints except for mild fatigue and shortness breath with exertion. He also has some shaking chills 3 hours after his radiotherapy. He is still awaiting the course of palliative radiotherapy well. He is expected to complete this course of radiation on 05/07/2016. He has no nausea or vomiting, no fever. The patient denied having any significant weight loss or night sweats. He has no headache or visual changes. He is here today for evaluation before starting his systemic therapy next week.  MEDICAL HISTORY: Past Medical History  Diagnosis Date  . IHD (ischemic heart disease)     Remote PCI to distal RCA and atherectomy of th LAD in 1996; S/P PCI to LCX and RCA in 2010.  Marland Kitchen Hyperlipidemia   . Hypertension   . Renal insufficiency     on Di[place]  . Hx of cardiovascular stress test     a.  ETT-Myoview 8/14:  No scar or ischemia, EF 52%, normal study;  b. ETT-Myoview:  EF 55%, no ST changes, no ischemia; Normal   . History of echocardiogram     Echo 8/14:  EF 55-60%, Gr 1 DD  . Submandibular lymphadenopathy 03/21/2016  . Cancer ([place]) 1977    tonsil  . Metastatic adenocarcinoma ([place]) 04/04/2016    ALLERGIES:  is allergic to demerol.  MEDICATIONS:  Current Outpatient Prescriptions  Medication Sig Dispense Refill  . amLODipine (NORVASC) 5 MG tablet Take 1 tablet by mouth  daily (Patient taking differently: Take 5 mg by mouth  daily) 90 tablet 1  . aspirin 81 MG tablet Take 81 mg by mouth daily.      . clopidogrel (PLAVIX) 75 MG tablet Take 1 tablet by mouth  daily (Patient taking differently: Take 75 mg by mouth  daily) 90 tablet 1  . diphenhydramine-acetaminophen (TYLENOL PM) 25-500 MG TABS tablet Take 1 tablet by mouth at bedtime as needed.    Marland Kitchen GLUCOSAMINE-CHONDROITIN PO Take 1 tablet by mouth 2 (two) times daily. Reported on 04/18/2016    . metoprolol (LOPRESSOR) 50 MG tablet Take 25-50 mg by mouth 2 (two) times daily. TAKE 50 MG BY MOUTH IN THE AM AND TAKE 25 MG BY MOUTH IN THE PM    . Multiple Vitamin (MULTIVITAMIN) tablet Take 1 tablet by mouth daily.      Marland Kitchen MYRBETRIQ 50 MG TB24 tablet     . nitroGLYCERIN (NITROSTAT) 0.4 MG SL tablet Place 1 tablet (0.4 mg total) under the tongue every 5 (five) minutes as needed. (Patient not taking: Reported on 03/21/2016) 25 tablet 2  . prochlorperazine (COMPAZINE) 10 MG tablet Take 1 tablet (10 mg total) by mouth every 6 (six) hours as needed for nausea or vomiting. (Patient  not taking: Reported on 04/18/2016) 30 tablet 0  . rosuvastatin (CRESTOR) 10 MG tablet Take 10 mg by mouth daily.    . tamsulosin (FLOMAX) 0.4 MG CAPS capsule Take 0.4 mg by mouth every evening.   11   No current facility-administered medications for this visit.    SURGICAL HISTORY:  Past Surgical History  Procedure Laterality Date  . Cardiac catheterization  09/13/2009  . Cardiac catheterization  09/04/2009    EF 60%  . Tonsillectomy    . Pci  08/2009    TO THE LCX AND RCA  . Cardiovascular stress test  12/08/2007    EF 57%    REVIEW OF SYSTEMS:  A comprehensive review of systems was negative except for:  Constitutional: positive for fatigue Respiratory: positive for dyspnea on exertion   PHYSICAL EXAMINATION: General appearance: alert, cooperative and no distress Head: Normocephalic, without obvious abnormality, atraumatic Neck: no carotid bruit, no JVD, supple, symmetrical, trachea midline, thyroid not enlarged, symmetric, no tenderness/mass/nodules and large submandibular mass Lymph nodes: large submandibular mass Resp: clear to auscultation bilaterally Back: symmetric, no curvature. ROM normal. No CVA tenderness. Cardio: regular rate and rhythm, S1, S2 normal, no murmur, click, rub or gallop GI: soft, non-tender; bowel sounds normal; no masses,  no organomegaly Extremities: extremities normal, atraumatic, no cyanosis or edema Neurologic: Alert and oriented X 3, normal strength and tone. Normal symmetric reflexes. Normal coordination and gait  ECOG PERFORMANCE STATUS: 1 - Symptomatic but completely ambulatory  Blood pressure 129/81, pulse 63, temperature 98.1 F (36.7 C), temperature source Oral, resp. rate 18, height 6' (1.829 m), weight 183 lb 9.6 oz (83.28 kg), SpO2 100 %.  LABORATORY DATA: Lab Results  Component Value Date   WBC 4.9 05/02/2016   HGB 13.3 05/02/2016   HCT 38.6 05/02/2016   MCV 91.7 05/02/2016   PLT 194 05/02/2016      Chemistry      Component Value Date/Time   NA 141 05/02/2016 1012   NA 143 04/02/2016 1120   K 4.6 05/02/2016 1012   K 4.3 04/02/2016 1120   CL 106 04/02/2016 1120   CO2 27 05/02/2016 1012   CO2 27 04/02/2016 1120   BUN 23.2 05/02/2016 1012   BUN 25* 04/02/2016 1120   CREATININE 1.6* 05/02/2016 1012   CREATININE 1.50* 04/02/2016 1120      Component Value Date/Time   CALCIUM 9.4 05/02/2016 1012   CALCIUM 9.6 04/02/2016 1120   ALKPHOS 74 05/02/2016 1012   ALKPHOS 79 03/07/2016 1708   AST 16 05/02/2016 1012   AST 21 03/07/2016 1708   ALT 14 05/02/2016 1012   ALT 20 03/07/2016 1708   BILITOT 0.41 05/02/2016 1012   BILITOT 0.7  03/07/2016 1708       RADIOGRAPHIC STUDIES: Mr Jeri Cos Wo Contrast  04-19-16  CLINICAL DATA:  Metastatic adenocarcinoma of unknown primary involving the right submandibular gland and left seventh rib diagnosed 03/2016. Staging. EXAM: MRI HEAD WITHOUT AND WITH CONTRAST TECHNIQUE: Multiplanar, multiecho pulse sequences of the brain and surrounding structures were obtained without and with intravenous contrast. CONTRAST:  17mL MULTIHANCE GADOBENATE DIMEGLUMINE 529 MG/ML IV SOLN COMPARISON:  Head CT 11/09/2014 FINDINGS: There is no evidence of acute infarct, intracranial hemorrhage, mass, midline shift, or extra-axial fluid collection. There is mild cerebral atrophy. Small foci of T2 hyperintensity in the cerebral white matter bilaterally are nonspecific but compatible with mild chronic small vessel ischemic disease. No abnormal brain parenchymal or meningeal enhancement is identified. Masslike soft tissue in  the right submandibular region is partially visualized as described on prior PET-CT. There is atherosclerotic disease of the distal left vertebral artery. Major intracranial vascular flow voids are otherwise preserved. The no significant paranasal sinus or mastoid air cell inflammatory change is seen. No suspicious skull lesion is identified. IMPRESSION: 1. No evidence of intracranial metastases. 2. Mild chronic small vessel ischemic disease. Electronically Signed   By: Logan Bores M.D.   On: 04/09/2016 17:15   US Biopsy  04/02/2016  INDICATION: No known primary, now with indeterminate hypermetabolic right submandibular mass. Please perform ultrasound-guided biopsy for tissue diagnostic purposes EXAM: ULTRASOUND-GUIDED RIGHT SUBMANDIBULAR MASS BIOPSY COMPARISON:  PET-CT - 03/20/2016 MEDICATIONS: None ANESTHESIA/SEDATION: Fentanyl 50 mcg IV; Versed 2.5 mg IV Total Moderate Sedation time: 13 minutes. The patient's level of consciousness and vital signs were monitored continuously by radiology nursing  throughout the procedure under my direct supervision. COMPLICATIONS: None immediate. PROCEDURE: Informed written consent was obtained from the patient after a discussion of the risks, benefits and alternatives to treatment. The patient understands and consents the procedure. A timeout was performed prior to the initiation of the procedure. Ultrasound scanning was performed of the right submandibular region demonstrating an indeterminate mixed echogenic at least 1.8 x 2.6 cm mass correlating with a portion of the hypermetabolic right submandibular mass seen on preceding PET-CT. The procedure was planned. The right upper neck was prepped and draped in the usual sterile fashion. The overlying soft tissues were anesthetized with 1% lidocaine with epinephrine. A 17 gauge, 6.8 cm co-axial needle was advanced into a peripheral aspect of the lesion. This was followed by 5 core biopsies with an 18 gauge core device under direct ultrasound guidance. Superficial hemostasis was obtained with manual compression. Post procedural scanning was negative for definitive area of hemorrhage or additional complication. A dressing was placed. The patient tolerated the procedure well without immediate post procedural complication. IMPRESSION: Technically successful ultrasound guided core needle biopsy of indeterminate hypermetabolic right submandibular gland mass. Electronically Signed   By: Sandi Mariscal M.D.   On: 04/02/2016 15:39   Ct Biopsy  04/02/2016  INDICATION: No known primary, now with indeterminate hypermetabolic mixed lytic and sclerotic mass involving the posterior lateral aspect of the left 7th rib. Please perform CT-guided biopsy for tissue diagnostic purposes. EXAM: CT-GUIDED BONE LESION BIOPSY MEDICATIONS: None ANESTHESIA/SEDATION: Fentanyl 50 mcg IV; Versed 3.5 mg IV Sedation Time: 25 minutes; The patient was continuously monitored during the procedure by the interventional radiology nurse under my direct supervision.  COMPLICATIONS: None immediate. PROCEDURE: Informed consent was obtained from the patient following an explanation of the procedure, risks, benefits and alternatives. The patient understands, agrees and consents for the procedure. All questions were addressed. A time out was performed prior to the initiation of the procedure. The patient was positioned prone and non-contrast localization CT was performed of the thorax demonstrating the known mixed lytic and sclerotic lesion involving the posterior lateral aspect of the left seventh rib with dominant component measuring approximately 4.1 x 1.8 cm (image 23, series 2). The operative site was prepped and draped in the usual sterile fashion. Under sterile conditions and local anesthesia, a 22 gauge spinal needle was utilized for procedural planning. Next, a 63 gauge coaxial needle was advanced into the peripheral aspect of the mixed lytic and sclerotic mass however despite appropriate positioning, a core biopsy was unable to be obtained with a standard 18 gauge core needle biopsy device secondary to sclerotic nature of the lesion. As such, an 59  gauge coaxial bone biopsy needle was advanced into the posterior aspect of the mixed lytic and sclerotic lesion involving in the left seventh rib. Needle position was confirmed with CT imaging. Initially, a biopsy was obtained with the inner 13 gauge biopsy device (image 31, series 7). Finally, the outer 11 gauge coaxial bone biopsy needle was utilized to obtain an additional core biopsy. The needle was removed intact. Hemostasis was obtained with compression and a dressing was placed. Postprocedural was scanning was negative for evidence of a complication, specifically, no evidence of pneumothorax. The patient tolerated the procedure well without immediate post procedural complication. IMPRESSION: Successful CT guided biopsy of mixed lytic and sclerotic lesion involving the posterior lateral aspect of the left seventh rib.  Electronically Signed   By: Sandi Mariscal M.D.   On: 04/02/2016 18:10    ASSESSMENT AND PLAN: This is a very pleasant 81 years old white male recently diagnosed with metastatic adenocarcinoma questionable for mucoepidermoid carcinoma involving the submandibular gland as well as left seventh ribs bone lesion. The recent MRI of the brain showed no evidence of metastatic disease to the brain. I had a lengthy discussion with the patient and his family about his current disease stage, prognosis and treatment options. His case was discussed at the weekly thoracic conference. He is currently undergoing palliative radiotherapy to the left seventh rib bone lesion. The patient is expected to start the first cycle of systemic chemotherapy with carboplatin and paclitaxel on 05/08/2016. I reminded the patient adverse effect of this treatment and provided him with handouts about the chemotherapy regimen. He would come back for follow-up visit in 2 weeks for evaluation and management of any adverse effect of his treatment. He was advised to call immediately if he has any concerning symptoms in the interval. The patient voices understanding of current disease status and treatment options and is in agreement with the current care plan.  All questions were answered. The patient knows to call the clinic with any problems, questions or concerns. We can certainly see the patient much sooner if necessary.  Disclaimer: This note was dictated with voice recognition software. Similar sounding words can inadvertently be transcribed and may not be corrected upon review.

## 2016-05-03 ENCOUNTER — Ambulatory Visit
Admission: RE | Admit: 2016-05-03 | Discharge: 2016-05-03 | Disposition: A | Discharge status: Home / Self Care | Payer: Medicare Other | Source: Ambulatory Visit | Attending: Radiation Oncology | Admitting: Radiation Oncology

## 2016-05-03 DIAGNOSIS — C7951 Secondary malignant neoplasm of bone: Secondary | ICD-10-CM | POA: Diagnosis not present

## 2016-05-03 DIAGNOSIS — Z510 Encounter for antineoplastic radiation therapy: Secondary | ICD-10-CM | POA: Diagnosis not present

## 2016-05-03 DIAGNOSIS — C089 Malignant neoplasm of major salivary gland, unspecified: Secondary | ICD-10-CM | POA: Diagnosis not present

## 2016-05-03 NOTE — Addendum Note (Signed)
Encounter addended by: Malena Edman, RN on: 05/03/2016  2:10 PM<BR>     Documentation filed: Inpatient Patient Education

## 2016-05-03 NOTE — Addendum Note (Signed)
Encounter addended by: Malena Edman, RN on: 05/03/2016  2:18 PM<BR>     Documentation filed: Inpatient Patient Education

## 2016-05-07 ENCOUNTER — Telehealth: Payer: Self-pay | Admitting: Internal Medicine

## 2016-05-07 ENCOUNTER — Other Ambulatory Visit: Payer: Medicare Other

## 2016-05-07 ENCOUNTER — Ambulatory Visit
Admission: RE | Admit: 2016-05-07 | Discharge: 2016-05-07 | Disposition: A | Discharge status: Home / Self Care | Payer: Medicare Other | Source: Ambulatory Visit | Attending: Radiation Oncology | Admitting: Radiation Oncology

## 2016-05-07 ENCOUNTER — Encounter: Payer: Self-pay | Admitting: Radiation Oncology

## 2016-05-07 VITALS — BP 108/70 | HR 60 | Temp 97.5°F | Resp 18 | Ht 72.0 in | Wt 184.9 lb

## 2016-05-07 DIAGNOSIS — C7951 Secondary malignant neoplasm of bone: Secondary | ICD-10-CM | POA: Diagnosis not present

## 2016-05-07 DIAGNOSIS — C089 Malignant neoplasm of major salivary gland, unspecified: Secondary | ICD-10-CM | POA: Diagnosis not present

## 2016-05-07 DIAGNOSIS — Z510 Encounter for antineoplastic radiation therapy: Secondary | ICD-10-CM | POA: Diagnosis not present

## 2016-05-07 NOTE — Progress Notes (Signed)
Weekly Management Note Current Dose: 30 Gy  Projected Dose: 30 Gy   Narrative:  The patient presents for routine under treatment assessment.  CBCT/MVCT images/Port film x-rays were reviewed.  The chart was checked.   Mr. Jose Frank is here for his 6th fraction of radiation to his Chest, Rib. He denies any pain at this time. He states the ache he had on his left side has improved since radiation started. He is now able to sleep on his side. He denies any shortness of breath. He does report that 3 hours after each radiation treatment he gets the "chills". They wrap him in a blanket and it will go away after 20- 30 minutes. He is eating well per his report.  Physical Findings:  Unchanged  Vitals:  Filed Vitals:   05/07/16 1356  BP: 108/70  Pulse: 60  Temp: 97.5 F (36.4 C)  Resp: 18   Weight:  Wt Readings from Last 3 Encounters:  05/07/16 184 lb 14.4 oz (83.87 kg)  05/02/16 183 lb 9.6 oz (83.28 kg)  04/30/16 185 lb 9.6 oz (84.188 kg)   Lab Results  Component Value Date   WBC 4.9 05/02/2016   HGB 13.3 05/02/2016   HCT 38.6 05/02/2016   MCV 91.7 05/02/2016   PLT 194 05/02/2016   Lab Results  Component Value Date   CREATININE 1.6* 05/02/2016   BUN 23.2 05/02/2016   NA 141 05/02/2016   K 4.6 05/02/2016   CL 106 04/02/2016   CO2 27 05/02/2016    Impression:  The patient is tolerating radiation well with good pain response.   Plan: The patient has completed his radiation treatment. He will return to the clinic for a follow up in a month. The patient is scheduled to begin chemotherapy tomorrow.  ------------------------------------------------  Thea Silversmith, MD  This document serves as a record of services personally performed by Thea Silversmith, MD. It was created on her behalf by Darcus Austin, a trained medical scribe. The creation of this record is based on the scribe's personal observations and the provider's statements to them. This document has been checked and approved by  the attending provider.

## 2016-05-07 NOTE — Telephone Encounter (Signed)
Pt cant make it on 6/5 cx apt and changed to 6/7

## 2016-05-07 NOTE — Progress Notes (Signed)
Jose Frank has received 10 fractions to his chest rib.  Skin to chest rib with normal color.  Using Sonafine bid.  Appetite is good.  Having fatigue in the evening.  Denies pain, SOB and swallowing problems.  EOT education done on Friday, 05-03-16 Wt Readings from Last 3 Encounters:  05/07/16 184 lb 14.4 oz (83.87 kg)  05/02/16 183 lb 9.6 oz (83.28 kg)  04/30/16 185 lb 9.6 oz (84.188 kg)   BP 108/70 mmHg  Pulse 60  Temp(Src) 97.5 F (36.4 C) (Oral)  Resp 18  Ht 6" (0.152 m)  Wt 184 lb 14.4 oz (83.87 kg)  BMI 3630.11 kg/m2  SpO2 95% .

## 2016-05-08 ENCOUNTER — Other Ambulatory Visit (HOSPITAL_BASED_OUTPATIENT_CLINIC_OR_DEPARTMENT_OTHER): Payer: Medicare Other

## 2016-05-08 ENCOUNTER — Ambulatory Visit (HOSPITAL_BASED_OUTPATIENT_CLINIC_OR_DEPARTMENT_OTHER): Payer: Medicare Other

## 2016-05-08 VITALS — BP 118/72 | HR 58 | Temp 97.9°F | Resp 18

## 2016-05-08 DIAGNOSIS — Z5111 Encounter for antineoplastic chemotherapy: Secondary | ICD-10-CM | POA: Diagnosis not present

## 2016-05-08 DIAGNOSIS — C770 Secondary and unspecified malignant neoplasm of lymph nodes of head, face and neck: Secondary | ICD-10-CM

## 2016-05-08 DIAGNOSIS — C089 Malignant neoplasm of major salivary gland, unspecified: Secondary | ICD-10-CM

## 2016-05-08 DIAGNOSIS — C801 Malignant (primary) neoplasm, unspecified: Secondary | ICD-10-CM

## 2016-05-08 DIAGNOSIS — D490 Neoplasm of unspecified behavior of digestive system: Secondary | ICD-10-CM

## 2016-05-08 DIAGNOSIS — C081 Malignant neoplasm of sublingual gland: Secondary | ICD-10-CM

## 2016-05-08 DIAGNOSIS — C7951 Secondary malignant neoplasm of bone: Secondary | ICD-10-CM

## 2016-05-08 LAB — COMPREHENSIVE METABOLIC PANEL
ALT: 17 U/L (ref 0–55)
AST: 19 U/L (ref 5–34)
Albumin: 3.7 g/dL (ref 3.5–5.0)
Alkaline Phosphatase: 73 U/L (ref 40–150)
Anion Gap: 9 mEq/L (ref 3–11)
BILIRUBIN TOTAL: 0.38 mg/dL (ref 0.20–1.20)
BUN: 27.8 mg/dL — AB (ref 7.0–26.0)
CALCIUM: 9.4 mg/dL (ref 8.4–10.4)
CHLORIDE: 108 meq/L (ref 98–109)
CO2: 23 meq/L (ref 22–29)
CREATININE: 1.6 mg/dL — AB (ref 0.7–1.3)
EGFR: 39 mL/min/{1.73_m2} — ABNORMAL LOW (ref >90)
Glucose: 116 mg/dl (ref 70–140)
Potassium: 4.5 mEq/L (ref 3.5–5.1)
Sodium: 141 mEq/L (ref 136–145)
TOTAL PROTEIN: 7 g/dL (ref 6.4–8.3)

## 2016-05-08 LAB — CBC WITH DIFFERENTIAL/PLATELET
BASO%: 1 % (ref 0.0–2.0)
BASOS ABS: 0 10*3/uL (ref 0.0–0.1)
EOS ABS: 0.1 10*3/uL (ref 0.0–0.5)
EOS%: 2.2 % (ref 0.0–7.0)
HEMATOCRIT: 42.3 % (ref 38.4–49.9)
HGB: 14.2 g/dL (ref 13.0–17.1)
LYMPH%: 17.8 % (ref 14.0–49.0)
MCH: 31.4 pg (ref 27.2–33.4)
MCHC: 33.6 g/dL (ref 32.0–36.0)
MCV: 93.4 fL (ref 79.3–98.0)
MONO#: 0.4 10*3/uL (ref 0.1–0.9)
MONO%: 8.8 % (ref 0.0–14.0)
NEUT#: 3.4 10*3/uL (ref 1.5–6.5)
NEUT%: 70.2 % (ref 39.0–75.0)
Platelets: 219 10*3/uL (ref 140–400)
RBC: 4.53 10*6/uL (ref 4.20–5.82)
RDW: 13.2 % (ref 11.0–14.6)
WBC: 4.8 10*3/uL (ref 4.0–10.3)
lymph#: 0.9 10*3/uL (ref 0.9–3.3)

## 2016-05-08 MED ORDER — DIPHENHYDRAMINE HCL 50 MG/ML IJ SOLN
INTRAMUSCULAR | Status: AC
  Filled 2016-05-08: qty 1

## 2016-05-08 MED ORDER — FAMOTIDINE IN NACL 20-0.9 MG/50ML-% IV SOLN
INTRAVENOUS | Status: AC
  Filled 2016-05-08: qty 50

## 2016-05-08 MED ORDER — PALONOSETRON HCL INJECTION 0.25 MG/5ML
INTRAVENOUS | Status: AC
  Filled 2016-05-08: qty 5

## 2016-05-08 MED ORDER — DIPHENHYDRAMINE HCL 50 MG/ML IJ SOLN
50.0000 mg | Freq: Once | INTRAMUSCULAR | Status: AC
  Administered 2016-05-08: 50 mg via INTRAVENOUS

## 2016-05-08 MED ORDER — PACLITAXEL CHEMO INJECTION 300 MG/50ML
200.0000 mg/m2 | Freq: Once | INTRAVENOUS | Status: AC
  Administered 2016-05-08: 408 mg via INTRAVENOUS
  Filled 2016-05-08: qty 68

## 2016-05-08 MED ORDER — FAMOTIDINE IN NACL 20-0.9 MG/50ML-% IV SOLN
20.0000 mg | Freq: Once | INTRAVENOUS | Status: AC
  Administered 2016-05-08: 20 mg via INTRAVENOUS

## 2016-05-08 MED ORDER — SODIUM CHLORIDE 0.9 % IV SOLN
20.0000 mg | Freq: Once | INTRAVENOUS | Status: AC
  Administered 2016-05-08: 20 mg via INTRAVENOUS
  Filled 2016-05-08: qty 2

## 2016-05-08 MED ORDER — SODIUM CHLORIDE 0.9 % IV SOLN
Freq: Once | INTRAVENOUS | Status: AC
  Administered 2016-05-08: 11:00:00 via INTRAVENOUS

## 2016-05-08 MED ORDER — SODIUM CHLORIDE 0.9 % IV SOLN
359.0000 mg | Freq: Once | INTRAVENOUS | Status: AC
  Administered 2016-05-08: 360 mg via INTRAVENOUS
  Filled 2016-05-08: qty 36

## 2016-05-08 MED ORDER — PALONOSETRON HCL INJECTION 0.25 MG/5ML
0.2500 mg | Freq: Once | INTRAVENOUS | Status: AC
  Administered 2016-05-08: 0.25 mg via INTRAVENOUS

## 2016-05-08 NOTE — Patient Instructions (Addendum)
[facility] Discharge Instructions for Patients Receiving Chemotherapy  Today you received the following chemotherapy agents:  Carboplatin, Taxol  To help prevent nausea and vomiting after your treatment, we encourage you to take your nausea medication as prescribed.   If you develop nausea and vomiting that is not controlled by your nausea medication, call the clinic.   BELOW ARE SYMPTOMS THAT SHOULD BE REPORTED IMMEDIATELY:  *FEVER GREATER THAN 100.5 F  *CHILLS WITH OR WITHOUT FEVER  NAUSEA AND VOMITING THAT IS NOT CONTROLLED WITH YOUR NAUSEA MEDICATION  *UNUSUAL SHORTNESS OF BREATH  *UNUSUAL BRUISING OR BLEEDING  TENDERNESS IN MOUTH AND THROAT WITH OR WITHOUT PRESENCE OF ULCERS  *URINARY PROBLEMS  *BOWEL PROBLEMS  UNUSUAL RASH Items with * indicate a potential emergency and should be followed up as soon as possible.  Feel free to call the clinic you have any questions or concerns. The clinic phone number is [phone].  Please show the [facility] at check-in to the Emergency Department and triage nurse.     Palonosetron Injection What is this medicine? PALONOSETRON (pal oh NOE se tron) is used to prevent nausea and vomiting caused by chemotherapy. It also helps prevent delayed nausea and vomiting that may occur a few days after your treatment. This medicine may be used for other purposes; ask your health care provider or pharmacist if you have questions. What should I tell my health care provider before I take this medicine? They need to know if you have any of these conditions: -an unusual or allergic reaction to palonosetron, dolasetron, granisetron, ondansetron, other medicines, foods, dyes, or preservatives -pregnant or trying to get pregnant -breast-feeding How should I use this medicine? This medicine is for infusion into a vein. It is given by a health care professional in a hospital or clinic setting. Talk to your pediatrician  regarding the use of this medicine in children. While this drug may be prescribed for children as young as 1 month for selected conditions, precautions do apply. Overdosage: If you think you have taken too much of this medicine contact a poison control center or emergency room at once. NOTE: This medicine is only for you. Do not share this medicine with others. What if I miss a dose? This does not apply. What may interact with this medicine? -certain medicines for depression, anxiety, or psychotic disturbances -fentanyl -linezolid -MAOIs like Carbex, Eldepryl, Marplan, Nardil, and Parnate -methylene blue (injected into a vein) -tramadol This list may not describe all possible interactions. Give your health care provider a list of all the medicines, herbs, non-prescription drugs, or dietary supplements you use. Also tell them if you smoke, drink alcohol, or use illegal drugs. Some items may interact with your medicine. What should I watch for while using this medicine? Your condition will be monitored carefully while you are receiving this medicine. What side effects may I notice from receiving this medicine? Side effects that you should report to your doctor or health care professional as soon as possible: -allergic reactions like skin rash, itching or hives, swelling of the face, lips, or tongue -breathing problems -confusion -dizziness -fast, irregular heartbeat -fever and chills -loss of balance or coordination -seizures -sweating -swelling of the hands and feet -tremors -unusually weak or tired Side effects that usually do not require medical attention (report to your doctor or health care professional if they continue or are bothersome): -constipation or diarrhea -headache This list may not describe all possible side effects. Call your doctor for  medical advice about side effects. You may report side effects to FDA at 1-800-FDA-1088. Where should I keep my medicine? This drug  is given in a hospital or clinic and will not be stored at home. NOTE: This sheet is a summary. It may not cover all possible information. If you have questions about this medicine, talk to your doctor, pharmacist, or health care provider.    2016, Elsevier/Gold Standard. (2013-10-01 10:38:36)    Paclitaxel injection What is this medicine? PACLITAXEL (PAK li TAX el) is a chemotherapy drug. It targets fast dividing cells, like cancer cells, and causes these cells to die. This medicine is used to treat ovarian cancer, breast cancer, and other cancers. This medicine may be used for other purposes; ask your health care provider or pharmacist if you have questions. What should I tell my health care provider before I take this medicine? They need to know if you have any of these conditions: -blood disorders -irregular heartbeat -infection (especially a virus infection such as chickenpox, cold sores, or herpes) -liver disease -previous or ongoing radiation therapy -an unusual or allergic reaction to paclitaxel, alcohol, polyoxyethylated castor oil, other chemotherapy agents, other medicines, foods, dyes, or preservatives -pregnant or trying to get pregnant -breast-feeding How should I use this medicine? This drug is given as an infusion into a vein. It is administered in a hospital or clinic by a specially trained health care professional. Talk to your pediatrician regarding the use of this medicine in children. Special care may be needed. Overdosage: If you think you have taken too much of this medicine contact a poison control center or emergency room at once. NOTE: This medicine is only for you. Do not share this medicine with others. What if I miss a dose? It is important not to miss your dose. Call your doctor or health care professional if you are unable to keep an appointment. What may interact with this medicine? Do not take this medicine with any of the following  medications: -disulfiram -metronidazole This medicine may also interact with the following medications: -cyclosporine -diazepam -ketoconazole -medicines to increase blood counts like filgrastim, pegfilgrastim, sargramostim -other chemotherapy drugs like cisplatin, doxorubicin, epirubicin, etoposide, teniposide, vincristine -quinidine -testosterone -vaccines -verapamil Talk to your doctor or health care professional before taking any of these medicines: -acetaminophen -aspirin -ibuprofen -ketoprofen -naproxen This list may not describe all possible interactions. Give your health care provider a list of all the medicines, herbs, non-prescription drugs, or dietary supplements you use. Also tell them if you smoke, drink alcohol, or use illegal drugs. Some items may interact with your medicine. What should I watch for while using this medicine? Your condition will be monitored carefully while you are receiving this medicine. You will need important blood work done while you are taking this medicine. This drug may make you feel generally unwell. This is not uncommon, as chemotherapy can affect healthy cells as well as cancer cells. Report any side effects. Continue your course of treatment even though you feel ill unless your doctor tells you to stop. This medicine can cause serious allergic reactions. To reduce your risk you will need to take other medicine(s) before treatment with this medicine. In some cases, you may be given additional medicines to help with side effects. Follow all directions for their use. Call your doctor or health care professional for advice if you get a fever, chills or sore throat, or other symptoms of a cold or flu. Do not treat yourself. This drug decreases your  body's ability to fight infections. Try to avoid being around people who are sick. This medicine may increase your risk to bruise or bleed. Call your doctor or health care professional if you notice any  unusual bleeding. Be careful brushing and flossing your teeth or using a toothpick because you may get an infection or bleed more easily. If you have any dental work done, tell your dentist you are receiving this medicine. Avoid taking products that contain aspirin, acetaminophen, ibuprofen, naproxen, or ketoprofen unless instructed by your doctor. These medicines may hide a fever. Do not become pregnant while taking this medicine. Women should inform their doctor if they wish to become pregnant or think they might be pregnant. There is a potential for serious side effects to an unborn child. Talk to your health care professional or pharmacist for more information. Do not breast-feed an infant while taking this medicine. Men are advised not to father a child while receiving this medicine. This product may contain alcohol. Ask your pharmacist or healthcare provider if this medicine contains alcohol. Be sure to tell all healthcare providers you are taking this medicine. Certain medicines, like metronidazole and disulfiram, can cause an unpleasant reaction when taken with alcohol. The reaction includes flushing, headache, nausea, vomiting, sweating, and increased thirst. The reaction can last from 30 minutes to several hours. What side effects may I notice from receiving this medicine? Side effects that you should report to your doctor or health care professional as soon as possible: -allergic reactions like skin rash, itching or hives, swelling of the face, lips, or tongue -low blood counts - This drug may decrease the number of white blood cells, red blood cells and platelets. You may be at increased risk for infections and bleeding. -signs of infection - fever or chills, cough, sore throat, pain or difficulty passing urine -signs of decreased platelets or bleeding - bruising, pinpoint red spots on the skin, black, tarry stools, nosebleeds -signs of decreased red blood cells - unusually weak or tired,  fainting spells, lightheadedness -breathing problems -chest pain -high or low blood pressure -mouth sores -nausea and vomiting -pain, swelling, redness or irritation at the injection site -pain, tingling, numbness in the hands or feet -slow or irregular heartbeat -swelling of the ankle, feet, hands Side effects that usually do not require medical attention (report to your doctor or health care professional if they continue or are bothersome): -bone pain -complete hair loss including hair on your head, underarms, pubic hair, eyebrows, and eyelashes -changes in the color of fingernails -diarrhea -loosening of the fingernails -loss of appetite -muscle or joint pain -red flush to skin -sweating This list may not describe all possible side effects. Call your doctor for medical advice about side effects. You may report side effects to FDA at 1-800-FDA-1088. Where should I keep my medicine? This drug is given in a hospital or clinic and will not be stored at home. NOTE: This sheet is a summary. It may not cover all possible information. If you have questions about this medicine, talk to your doctor, pharmacist, or health care provider.    2016, Elsevier/Gold Standard. (2015-07-13 13:02:56)    Carboplatin injection What is this medicine? CARBOPLATIN (KAR boe pla tin) is a chemotherapy drug. It targets fast dividing cells, like cancer cells, and causes these cells to die. This medicine is used to treat ovarian cancer and many other cancers. This medicine may be used for other purposes; ask your health care provider or pharmacist if you have questions.  What should I tell my health care provider before I take this medicine? They need to know if you have any of these conditions: -blood disorders -hearing problems -kidney disease -recent or ongoing radiation therapy -an unusual or allergic reaction to carboplatin, cisplatin, other chemotherapy, other medicines, foods, dyes, or  preservatives -pregnant or trying to get pregnant -breast-feeding How should I use this medicine? This drug is usually given as an infusion into a vein. It is administered in a hospital or clinic by a specially trained health care professional. Talk to your pediatrician regarding the use of this medicine in children. Special care may be needed. Overdosage: If you think you have taken too much of this medicine contact a poison control center or emergency room at once. NOTE: This medicine is only for you. Do not share this medicine with others. What if I miss a dose? It is important not to miss a dose. Call your doctor or health care professional if you are unable to keep an appointment. What may interact with this medicine? -medicines for seizures -medicines to increase blood counts like filgrastim, pegfilgrastim, sargramostim -some antibiotics like amikacin, gentamicin, neomycin, streptomycin, tobramycin -vaccines Talk to your doctor or health care professional before taking any of these medicines: -acetaminophen -aspirin -ibuprofen -ketoprofen -naproxen This list may not describe all possible interactions. Give your health care provider a list of all the medicines, herbs, non-prescription drugs, or dietary supplements you use. Also tell them if you smoke, drink alcohol, or use illegal drugs. Some items may interact with your medicine. What should I watch for while using this medicine? Your condition will be monitored carefully while you are receiving this medicine. You will need important blood work done while you are taking this medicine. This drug may make you feel generally unwell. This is not uncommon, as chemotherapy can affect healthy cells as well as cancer cells. Report any side effects. Continue your course of treatment even though you feel ill unless your doctor tells you to stop. In some cases, you may be given additional medicines to help with side effects. Follow all directions  for their use. Call your doctor or health care professional for advice if you get a fever, chills or sore throat, or other symptoms of a cold or flu. Do not treat yourself. This drug decreases your body's ability to fight infections. Try to avoid being around people who are sick. This medicine may increase your risk to bruise or bleed. Call your doctor or health care professional if you notice any unusual bleeding. Be careful brushing and flossing your teeth or using a toothpick because you may get an infection or bleed more easily. If you have any dental work done, tell your dentist you are receiving this medicine. Avoid taking products that contain aspirin, acetaminophen, ibuprofen, naproxen, or ketoprofen unless instructed by your doctor. These medicines may hide a fever. Do not become pregnant while taking this medicine. Women should inform their doctor if they wish to become pregnant or think they might be pregnant. There is a potential for serious side effects to an unborn child. Talk to your health care professional or pharmacist for more information. Do not breast-feed an infant while taking this medicine. What side effects may I notice from receiving this medicine? Side effects that you should report to your doctor or health care professional as soon as possible: -allergic reactions like skin rash, itching or hives, swelling of the face, lips, or tongue -signs of infection - fever or chills, cough,  sore throat, pain or difficulty passing urine -signs of decreased platelets or bleeding - bruising, pinpoint red spots on the skin, black, tarry stools, nosebleeds -signs of decreased red blood cells - unusually weak or tired, fainting spells, lightheadedness -breathing problems -changes in hearing -changes in vision -chest pain -high blood pressure -low blood counts - This drug may decrease the number of white blood cells, red blood cells and platelets. You may be at increased risk for  infections and bleeding. -nausea and vomiting -pain, swelling, redness or irritation at the injection site -pain, tingling, numbness in the hands or feet -problems with balance, talking, walking -trouble passing urine or change in the amount of urine Side effects that usually do not require medical attention (report to your doctor or health care professional if they continue or are bothersome): -hair loss -loss of appetite -metallic taste in the mouth or changes in taste This list may not describe all possible side effects. Call your doctor for medical advice about side effects. You may report side effects to FDA at 1-800-FDA-1088. Where should I keep my medicine? This drug is given in a hospital or clinic and will not be stored at home. NOTE: This sheet is a summary. It may not cover all possible information. If you have questions about this medicine, talk to your doctor, pharmacist, or health care provider.    2016, Elsevier/Gold Standard. (2008-03-01 14:38:05)

## 2016-05-08 NOTE — Progress Notes (Signed)
Per Dr Julien Nordmann, [place] to treat today with CRT 1.6

## 2016-05-09 NOTE — Progress Notes (Signed)
  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   He had a decrease in his pain. Interestingly, he had chills in the afternoons after his treatment. He had no fevers or other systemic symptoms.   Plan: The patient has completed radiation treatment. The patient will return to radiation oncology clinic for routine followup in one month. I advised them to call or return sooner if they have any questions or concerns related to their recovery or treatment.  ------------------------------------------------  Thea Silversmith, MD  This document serves as a record of services personally performed by Thea Silversmith, MD. It was created on her behalf by Derek Mound, a trained medical scribe. The creation of this record is based on the scribe's personal observations and the provider's statements to them. This document has been checked and approved by the attending provider.

## 2016-05-10 ENCOUNTER — Ambulatory Visit (HOSPITAL_BASED_OUTPATIENT_CLINIC_OR_DEPARTMENT_OTHER): Payer: Medicare Other

## 2016-05-10 VITALS — BP 142/73 | HR 76 | Temp 97.7°F | Resp 20

## 2016-05-10 DIAGNOSIS — C081 Malignant neoplasm of sublingual gland: Secondary | ICD-10-CM | POA: Diagnosis not present

## 2016-05-10 DIAGNOSIS — C7951 Secondary malignant neoplasm of bone: Secondary | ICD-10-CM | POA: Diagnosis not present

## 2016-05-10 DIAGNOSIS — C089 Malignant neoplasm of major salivary gland, unspecified: Secondary | ICD-10-CM

## 2016-05-10 MED ORDER — PEGFILGRASTIM INJECTION 6 MG/0.6ML ~~LOC~~
6.0000 mg | PREFILLED_SYRINGE | Freq: Once | SUBCUTANEOUS | Status: AC
  Administered 2016-05-10: 6 mg via SUBCUTANEOUS
  Filled 2016-05-10: qty 0.6

## 2016-05-10 NOTE — Patient Instructions (Signed)
Pegfilgrastim injection What is this medicine? PEGFILGRASTIM (PEG fil gra stim) is a long-acting granulocyte colony-stimulating factor that stimulates the growth of neutrophils, a type of white blood cell important in the body's fight against infection. It is used to reduce the incidence of fever and infection in patients with certain types of cancer who are receiving chemotherapy that affects the bone marrow, and to increase survival after being exposed to high doses of radiation. This medicine may be used for other purposes; ask your health care provider or pharmacist if you have questions. What should I tell my health care provider before I take this medicine? They need to know if you have any of these conditions: -kidney disease -latex allergy -ongoing radiation therapy -sickle cell disease -skin reactions to acrylic adhesives (On-Body Injector only) -an unusual or allergic reaction to pegfilgrastim, filgrastim, other medicines, foods, dyes, or preservatives -pregnant or trying to get pregnant -breast-feeding How should I use this medicine? This medicine is for injection under the skin. If you get this medicine at home, you will be taught how to prepare and give the pre-filled syringe or how to use the On-body Injector. Refer to the patient Instructions for Use for detailed instructions. Use exactly as directed. Take your medicine at regular intervals. Do not take your medicine more often than directed. It is important that you put your used needles and syringes in a special sharps container. Do not put them in a trash can. If you do not have a sharps container, call your pharmacist or healthcare provider to get one. Talk to your pediatrician regarding the use of this medicine in children. While this drug may be prescribed for selected conditions, precautions do apply. Overdosage: If you think you have taken too much of this medicine contact a poison control center or emergency room at  once. NOTE: This medicine is only for you. Do not share this medicine with others. What if I miss a dose? It is important not to miss your dose. Call your doctor or health care professional if you miss your dose. If you miss a dose due to an On-body Injector failure or leakage, a new dose should be administered as soon as possible using a single prefilled syringe for manual use. What may interact with this medicine? Interactions have not been studied. Give your health care provider a list of all the medicines, herbs, non-prescription drugs, or dietary supplements you use. Also tell them if you smoke, drink alcohol, or use illegal drugs. Some items may interact with your medicine. This list may not describe all possible interactions. Give your health care provider a list of all the medicines, herbs, non-prescription drugs, or dietary supplements you use. Also tell them if you smoke, drink alcohol, or use illegal drugs. Some items may interact with your medicine. What should I watch for while using this medicine? You may need blood work done while you are taking this medicine. If you are going to need a MRI, CT scan, or other procedure, tell your doctor that you are using this medicine (On-Body Injector only). What side effects may I notice from receiving this medicine? Side effects that you should report to your doctor or health care professional as soon as possible: -allergic reactions like skin rash, itching or hives, swelling of the face, lips, or tongue -dizziness -fever -pain, redness, or irritation at site where injected -pinpoint red spots on the skin -red or dark-brown urine -shortness of breath or breathing problems -stomach or side pain, or pain   at the shoulder -swelling -tiredness -trouble passing urine or change in the amount of urine Side effects that usually do not require medical attention (report to your doctor or health care professional if they continue or are  bothersome): -bone pain -muscle pain This list may not describe all possible side effects. Call your doctor for medical advice about side effects. You may report side effects to FDA at 1-800-FDA-1088. Where should I keep my medicine? Keep out of the reach of children. Store pre-filled syringes in a refrigerator between 2 and 8 degrees C (36 and 46 degrees F). Do not freeze. Keep in carton to protect from light. Throw away this medicine if it is left out of the refrigerator for more than 48 hours. Throw away any unused medicine after the expiration date. NOTE: This sheet is a summary. It may not cover all possible information. If you have questions about this medicine, talk to your doctor, pharmacist, or health care provider.    2016, Elsevier/Gold Standard. (2014-12-15 14:30:14)  

## 2016-05-13 ENCOUNTER — Other Ambulatory Visit: Payer: Medicare Other

## 2016-05-13 ENCOUNTER — Encounter: Payer: Medicare Other | Admitting: Cardiovascular Disease

## 2016-05-13 ENCOUNTER — Ambulatory Visit: Payer: Medicare Other | Admitting: Internal Medicine

## 2016-05-13 NOTE — Progress Notes (Signed)
This encounter was created in error - please disregard.

## 2016-05-14 ENCOUNTER — Telehealth: Payer: Self-pay | Admitting: Cardiovascular Disease

## 2016-05-14 ENCOUNTER — Encounter: Payer: Self-pay | Admitting: Cardiovascular Disease

## 2016-05-15 ENCOUNTER — Other Ambulatory Visit (HOSPITAL_BASED_OUTPATIENT_CLINIC_OR_DEPARTMENT_OTHER): Payer: Medicare Other

## 2016-05-15 ENCOUNTER — Ambulatory Visit (HOSPITAL_BASED_OUTPATIENT_CLINIC_OR_DEPARTMENT_OTHER): Payer: Medicare Other | Admitting: Nurse Practitioner

## 2016-05-15 VITALS — BP 140/86 | HR 68 | Temp 97.7°F | Resp 19 | Wt 181.1 lb

## 2016-05-15 DIAGNOSIS — C801 Malignant (primary) neoplasm, unspecified: Secondary | ICD-10-CM

## 2016-05-15 DIAGNOSIS — C799 Secondary malignant neoplasm of unspecified site: Secondary | ICD-10-CM

## 2016-05-15 DIAGNOSIS — C770 Secondary and unspecified malignant neoplasm of lymph nodes of head, face and neck: Secondary | ICD-10-CM

## 2016-05-15 DIAGNOSIS — D701 Agranulocytosis secondary to cancer chemotherapy: Secondary | ICD-10-CM | POA: Diagnosis not present

## 2016-05-15 DIAGNOSIS — C089 Malignant neoplasm of major salivary gland, unspecified: Secondary | ICD-10-CM

## 2016-05-15 DIAGNOSIS — D490 Neoplasm of unspecified behavior of digestive system: Secondary | ICD-10-CM

## 2016-05-15 DIAGNOSIS — C7951 Secondary malignant neoplasm of bone: Secondary | ICD-10-CM | POA: Diagnosis not present

## 2016-05-15 LAB — COMPREHENSIVE METABOLIC PANEL
ALT: 67 U/L — ABNORMAL HIGH (ref 0–55)
ANION GAP: 5 meq/L (ref 3–11)
AST: 30 U/L (ref 5–34)
Albumin: 3.6 g/dL (ref 3.5–5.0)
Alkaline Phosphatase: 77 U/L (ref 40–150)
BUN: 32.4 mg/dL — ABNORMAL HIGH (ref 7.0–26.0)
CALCIUM: 9.4 mg/dL (ref 8.4–10.4)
CHLORIDE: 107 meq/L (ref 98–109)
CO2: 27 mEq/L (ref 22–29)
CREATININE: 1.7 mg/dL — AB (ref 0.7–1.3)
EGFR: 38 mL/min/{1.73_m2} — AB (ref >90)
Glucose: 104 mg/dl (ref 70–140)
POTASSIUM: 5 meq/L (ref 3.5–5.1)
Sodium: 139 mEq/L (ref 136–145)
Total Bilirubin: 0.39 mg/dL (ref 0.20–1.20)
Total Protein: 6.8 g/dL (ref 6.4–8.3)

## 2016-05-15 LAB — CBC WITH DIFFERENTIAL/PLATELET
BASO%: 1.7 % (ref 0.0–2.0)
BASOS ABS: 0 10*3/uL (ref 0.0–0.1)
EOS%: 11.2 % — ABNORMAL HIGH (ref 0.0–7.0)
Eosinophils Absolute: 0.2 10*3/uL (ref 0.0–0.5)
HEMATOCRIT: 40.1 % (ref 38.4–49.9)
HGB: 13.3 g/dL (ref 13.0–17.1)
LYMPH#: 0.7 10*3/uL — AB (ref 0.9–3.3)
LYMPH%: 42.2 % (ref 14.0–49.0)
MCH: 30.9 pg (ref 27.2–33.4)
MCHC: 33.3 g/dL (ref 32.0–36.0)
MCV: 92.9 fL (ref 79.3–98.0)
MONO#: 0.4 10*3/uL (ref 0.1–0.9)
MONO%: 22 % — ABNORMAL HIGH (ref 0.0–14.0)
NEUT#: 0.4 10*3/uL — CL (ref 1.5–6.5)
NEUT%: 22.9 % — AB (ref 39.0–75.0)
PLATELETS: 131 10*3/uL — AB (ref 140–400)
RBC: 4.31 10*6/uL (ref 4.20–5.82)
RDW: 13.2 % (ref 11.0–14.6)
WBC: 1.7 10*3/uL — ABNORMAL LOW (ref 4.0–10.3)

## 2016-05-15 NOTE — Progress Notes (Signed)
  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 Pablo Ledger. Completed on 05/07/2016. 2) systemic chemotherapy with carboplatin for AUC of 5 and paclitaxel 200 MG/M2 every 3 weeks. First dose 05/08/2016.   INTERVAL HISTORY:   Mr. Waight returns as scheduled. He completed cycle 1 carboplatin/Taxol 05/08/2016. He received Neulasta on 05/10/2016. On day 3 he developed nausea/vomiting and diarrhea. He had "achy" discomfort involving the knees and elbows. He had some chills. He noted numbness in the fingertips and toes. No rash. No shortness of breath. No wheezing. No chest pain. Left rib pain has resolved. At present he is feeling well.  Objective:  Vital signs in last 24 hours:  Blood pressure 140/86, pulse 68, temperature 97.7 F (36.5 C), temperature source Oral, resp. rate 19, weight 181 lb 1.6 oz (82.146 kg), SpO2 99 %.    HEENT: No thrush or ulcers. Firm right submandibular mass. Resp: Lungs clear bilaterally. Cardio: Regular rate and rhythm. GI: Abdomen soft and nontender. No hepatomegaly. Vascular: No leg edema.   Lab Results:  Lab Results  Component Value Date   WBC 1.7* 05/15/2016   HGB 13.3 05/15/2016   HCT 40.1 05/15/2016   MCV 92.9 05/15/2016   PLT 131* 05/15/2016   NEUTROABS 0.4* 05/15/2016    Imaging:  No results found.  Medications: I have reviewed the patient's current medications.  Assessment/Plan: 1. 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 Initiation of systemic chemotherapy with carboplatin/Taxol 05/08/2016 every 3 weeks.  2. Neutropenia secondary to chemotherapy.    Disposition: Mr. Arenz appears stable. He has completed 1 cycle of carboplatin/Taxol with Neulasta support. About 3 days following treatment he developed nausea/vomiting, diarrhea and arthralgias. Question related to chemotherapy and/or Neulasta. Symptoms have resolved. We reviewed dosing instructions for Compazine and also discussed Imodium for the diarrhea. He understands to contact the office if symptoms persist despite interventions.  He is neutropenic on labs today. We reviewed neutropenic precautions. He understands to contact the office with fever, chills, other signs of infection.  He will return for lab appointment in one week and a follow-up visit in 2 weeks. He will contact the office in the interim as outlined above or with any other problems.  Plan reviewed with Dr. Julien Nordmann.      Ned Card ANP/GNP-BC   05/15/2016  12:17 PM

## 2016-05-20 ENCOUNTER — Other Ambulatory Visit (HOSPITAL_BASED_OUTPATIENT_CLINIC_OR_DEPARTMENT_OTHER): Payer: Medicare Other

## 2016-05-20 DIAGNOSIS — C089 Malignant neoplasm of major salivary gland, unspecified: Secondary | ICD-10-CM

## 2016-05-20 DIAGNOSIS — D490 Neoplasm of unspecified behavior of digestive system: Secondary | ICD-10-CM

## 2016-05-20 DIAGNOSIS — C801 Malignant (primary) neoplasm, unspecified: Secondary | ICD-10-CM

## 2016-05-20 LAB — CBC WITH DIFFERENTIAL/PLATELET
BASO%: 0.6 % (ref 0.0–2.0)
Basophils Absolute: 0.1 10*3/uL (ref 0.0–0.1)
EOS ABS: 0.1 10*3/uL (ref 0.0–0.5)
EOS%: 1.3 % (ref 0.0–7.0)
HEMATOCRIT: 40 % (ref 38.4–49.9)
HEMOGLOBIN: 13.4 g/dL (ref 13.0–17.1)
LYMPH#: 1.2 10*3/uL (ref 0.9–3.3)
LYMPH%: 10.4 % — AB (ref 14.0–49.0)
MCH: 31.2 pg (ref 27.2–33.4)
MCHC: 33.6 g/dL (ref 32.0–36.0)
MCV: 92.7 fL (ref 79.3–98.0)
MONO#: 0.8 10*3/uL (ref 0.1–0.9)
MONO%: 7.1 % (ref 0.0–14.0)
NEUT%: 80.6 % — ABNORMAL HIGH (ref 39.0–75.0)
NEUTROS ABS: 9.4 10*3/uL — AB (ref 1.5–6.5)
PLATELETS: 163 10*3/uL (ref 140–400)
RBC: 4.31 10*6/uL (ref 4.20–5.82)
RDW: 13.4 % (ref 11.0–14.6)
WBC: 11.6 10*3/uL — AB (ref 4.0–10.3)

## 2016-05-20 LAB — COMPREHENSIVE METABOLIC PANEL
ALBUMIN: 3.7 g/dL (ref 3.5–5.0)
ALK PHOS: 95 U/L (ref 40–150)
ALT: 37 U/L (ref 0–55)
ANION GAP: 6 meq/L (ref 3–11)
AST: 24 U/L (ref 5–34)
BUN: 26.6 mg/dL — ABNORMAL HIGH (ref 7.0–26.0)
CALCIUM: 9.4 mg/dL (ref 8.4–10.4)
CO2: 26 meq/L (ref 22–29)
CREATININE: 1.7 mg/dL — AB (ref 0.7–1.3)
Chloride: 108 mEq/L (ref 98–109)
EGFR: 38 mL/min/{1.73_m2} — AB (ref >90)
Glucose: 101 mg/dl (ref 70–140)
Potassium: 4.7 mEq/L (ref 3.5–5.1)
Sodium: 141 mEq/L (ref 136–145)
TOTAL PROTEIN: 7 g/dL (ref 6.4–8.3)

## 2016-05-27 ENCOUNTER — Encounter: Payer: Self-pay | Admitting: Internal Medicine

## 2016-05-27 ENCOUNTER — Ambulatory Visit (HOSPITAL_BASED_OUTPATIENT_CLINIC_OR_DEPARTMENT_OTHER): Payer: Medicare Other

## 2016-05-27 ENCOUNTER — Ambulatory Visit (HOSPITAL_BASED_OUTPATIENT_CLINIC_OR_DEPARTMENT_OTHER): Payer: Medicare Other | Admitting: Internal Medicine

## 2016-05-27 ENCOUNTER — Other Ambulatory Visit (HOSPITAL_BASED_OUTPATIENT_CLINIC_OR_DEPARTMENT_OTHER): Payer: Medicare Other

## 2016-05-27 VITALS — BP 135/90 | HR 65 | Temp 98.3°F | Resp 18 | Ht 72.0 in | Wt 183.0 lb

## 2016-05-27 DIAGNOSIS — C770 Secondary and unspecified malignant neoplasm of lymph nodes of head, face and neck: Secondary | ICD-10-CM

## 2016-05-27 DIAGNOSIS — C801 Malignant (primary) neoplasm, unspecified: Secondary | ICD-10-CM

## 2016-05-27 DIAGNOSIS — C7951 Secondary malignant neoplasm of bone: Secondary | ICD-10-CM | POA: Diagnosis not present

## 2016-05-27 DIAGNOSIS — C089 Malignant neoplasm of major salivary gland, unspecified: Secondary | ICD-10-CM

## 2016-05-27 DIAGNOSIS — Z5111 Encounter for antineoplastic chemotherapy: Secondary | ICD-10-CM | POA: Diagnosis not present

## 2016-05-27 DIAGNOSIS — D490 Neoplasm of unspecified behavior of digestive system: Secondary | ICD-10-CM

## 2016-05-27 HISTORY — DX: Encounter for antineoplastic chemotherapy: Z51.11

## 2016-05-27 LAB — CBC WITH DIFFERENTIAL/PLATELET
BASO%: 0.6 % (ref 0.0–2.0)
BASOS ABS: 0.1 10*3/uL (ref 0.0–0.1)
EOS%: 0.4 % (ref 0.0–7.0)
Eosinophils Absolute: 0 10*3/uL (ref 0.0–0.5)
HEMATOCRIT: 38.4 % (ref 38.4–49.9)
HEMOGLOBIN: 13.3 g/dL (ref 13.0–17.1)
LYMPH#: 1.2 10*3/uL (ref 0.9–3.3)
LYMPH%: 14.6 % (ref 14.0–49.0)
MCH: 31.9 pg (ref 27.2–33.4)
MCHC: 34.6 g/dL (ref 32.0–36.0)
MCV: 92.1 fL (ref 79.3–98.0)
MONO#: 0.6 10*3/uL (ref 0.1–0.9)
MONO%: 8 % (ref 0.0–14.0)
NEUT%: 76.4 % — ABNORMAL HIGH (ref 39.0–75.0)
NEUTROS ABS: 6 10*3/uL (ref 1.5–6.5)
Platelets: 217 10*3/uL (ref 140–400)
RBC: 4.17 10*6/uL — ABNORMAL LOW (ref 4.20–5.82)
RDW: 13.5 % (ref 11.0–14.6)
WBC: 7.9 10*3/uL (ref 4.0–10.3)

## 2016-05-27 LAB — COMPREHENSIVE METABOLIC PANEL
ALT: 36 U/L (ref 0–55)
AST: 22 U/L (ref 5–34)
Albumin: 3.7 g/dL (ref 3.5–5.0)
Alkaline Phosphatase: 85 U/L (ref 40–150)
Anion Gap: 8 mEq/L (ref 3–11)
BUN: 29.6 mg/dL — AB (ref 7.0–26.0)
CO2: 25 meq/L (ref 22–29)
Calcium: 9.3 mg/dL (ref 8.4–10.4)
Chloride: 108 mEq/L (ref 98–109)
Creatinine: 1.6 mg/dL — ABNORMAL HIGH (ref 0.7–1.3)
EGFR: 42 mL/min/{1.73_m2} — AB (ref >90)
GLUCOSE: 105 mg/dL (ref 70–140)
POTASSIUM: 4.3 meq/L (ref 3.5–5.1)
SODIUM: 142 meq/L (ref 136–145)
Total Bilirubin: 0.38 mg/dL (ref 0.20–1.20)
Total Protein: 7.1 g/dL (ref 6.4–8.3)

## 2016-05-27 MED ORDER — PALONOSETRON HCL INJECTION 0.25 MG/5ML
INTRAVENOUS | Status: AC
  Filled 2016-05-27: qty 5

## 2016-05-27 MED ORDER — PALONOSETRON HCL INJECTION 0.25 MG/5ML
0.2500 mg | Freq: Once | INTRAVENOUS | Status: AC
  Administered 2016-05-27: 0.25 mg via INTRAVENOUS

## 2016-05-27 MED ORDER — SODIUM CHLORIDE 0.9 % IV SOLN
20.0000 mg | Freq: Once | INTRAVENOUS | Status: AC
  Administered 2016-05-27: 20 mg via INTRAVENOUS
  Filled 2016-05-27: qty 2

## 2016-05-27 MED ORDER — FAMOTIDINE IN NACL 20-0.9 MG/50ML-% IV SOLN
INTRAVENOUS | Status: AC
  Filled 2016-05-27: qty 50

## 2016-05-27 MED ORDER — PACLITAXEL CHEMO INJECTION 300 MG/50ML
200.0000 mg/m2 | Freq: Once | INTRAVENOUS | Status: AC
  Administered 2016-05-27: 408 mg via INTRAVENOUS
  Filled 2016-05-27: qty 68

## 2016-05-27 MED ORDER — SODIUM CHLORIDE 0.9 % IV SOLN
344.5000 mg | Freq: Once | INTRAVENOUS | Status: AC
  Administered 2016-05-27: 340 mg via INTRAVENOUS
  Filled 2016-05-27: qty 34

## 2016-05-27 MED ORDER — SODIUM CHLORIDE 0.9 % IV SOLN
Freq: Once | INTRAVENOUS | Status: AC
  Administered 2016-05-27: 12:00:00 via INTRAVENOUS

## 2016-05-27 MED ORDER — DIPHENHYDRAMINE HCL 50 MG/ML IJ SOLN
INTRAMUSCULAR | Status: AC
  Filled 2016-05-27: qty 1

## 2016-05-27 MED ORDER — DIPHENHYDRAMINE HCL 50 MG/ML IJ SOLN
50.0000 mg | Freq: Once | INTRAMUSCULAR | Status: AC
Start: 2016-05-27 — End: 2016-05-27
  Administered 2016-05-27: 50 mg via INTRAVENOUS

## 2016-05-27 MED ORDER — FAMOTIDINE IN NACL 20-0.9 MG/50ML-% IV SOLN
20.0000 mg | Freq: Once | INTRAVENOUS | Status: AC
  Administered 2016-05-27: 20 mg via INTRAVENOUS

## 2016-05-27 NOTE — Progress Notes (Signed)
[name] , it is okay to treat pt today with chemo and today's lab results creatinine.

## 2016-05-27 NOTE — Patient Instructions (Signed)
[facility] Discharge Instructions for Patients Receiving Chemotherapy  Today you received the following chemotherapy agents:  Carboplatin and Taxol.  To help prevent nausea and vomiting after your treatment, we encourage you to take your nausea medication as prescribed.   If you develop nausea and vomiting that is not controlled by your nausea medication, call the clinic.   BELOW ARE SYMPTOMS THAT SHOULD BE REPORTED IMMEDIATELY:  *FEVER GREATER THAN 100.5 F  *CHILLS WITH OR WITHOUT FEVER  NAUSEA AND VOMITING THAT IS NOT CONTROLLED WITH YOUR NAUSEA MEDICATION  *UNUSUAL SHORTNESS OF BREATH  *UNUSUAL BRUISING OR BLEEDING  TENDERNESS IN MOUTH AND THROAT WITH OR WITHOUT PRESENCE OF ULCERS  *URINARY PROBLEMS  *BOWEL PROBLEMS  UNUSUAL RASH Items with * indicate a potential emergency and should be followed up as soon as possible.  Feel free to call the clinic you have any questions or concerns. The clinic phone number is [phone].  Please show the [facility] at check-in to the Emergency Department and triage nurse.     Palonosetron Injection What is this medicine? PALONOSETRON (pal oh NOE se tron) is used to prevent nausea and vomiting caused by chemotherapy. It also helps prevent delayed nausea and vomiting that may occur a few days after your treatment. This medicine may be used for other purposes; ask your health care provider or pharmacist if you have questions. What should I tell my health care provider before I take this medicine? They need to know if you have any of these conditions: -an unusual or allergic reaction to palonosetron, dolasetron, granisetron, ondansetron, other medicines, foods, dyes, or preservatives -pregnant or trying to get pregnant -breast-feeding How should I use this medicine? This medicine is for infusion into a vein. It is given by a health care professional in a hospital or clinic setting. Talk to your pediatrician  regarding the use of this medicine in children. While this drug may be prescribed for children as young as 1 month for selected conditions, precautions do apply. Overdosage: If you think you have taken too much of this medicine contact a poison control center or emergency room at once. NOTE: This medicine is only for you. Do not share this medicine with others. What if I miss a dose? This does not apply. What may interact with this medicine? -certain medicines for depression, anxiety, or psychotic disturbances -fentanyl -linezolid -MAOIs like Carbex, Eldepryl, Marplan, Nardil, and Parnate -methylene blue (injected into a vein) -tramadol This list may not describe all possible interactions. Give your health care provider a list of all the medicines, herbs, non-prescription drugs, or dietary supplements you use. Also tell them if you smoke, drink alcohol, or use illegal drugs. Some items may interact with your medicine. What should I watch for while using this medicine? Your condition will be monitored carefully while you are receiving this medicine. What side effects may I notice from receiving this medicine? Side effects that you should report to your doctor or health care professional as soon as possible: -allergic reactions like skin rash, itching or hives, swelling of the face, lips, or tongue -breathing problems -confusion -dizziness -fast, irregular heartbeat -fever and chills -loss of balance or coordination -seizures -sweating -swelling of the hands and feet -tremors -unusually weak or tired Side effects that usually do not require medical attention (report to your doctor or health care professional if they continue or are bothersome): -constipation or diarrhea -headache This list may not describe all possible side effects. Call your doctor  for medical advice about side effects. You may report side effects to FDA at 1-800-FDA-1088. Where should I keep my medicine? This drug  is given in a hospital or clinic and will not be stored at home. NOTE: This sheet is a summary. It may not cover all possible information. If you have questions about this medicine, talk to your doctor, pharmacist, or health care provider.    2016, Elsevier/Gold Standard. (2013-10-01 10:38:36)    Paclitaxel injection What is this medicine? PACLITAXEL (PAK li TAX el) is a chemotherapy drug. It targets fast dividing cells, like cancer cells, and causes these cells to die. This medicine is used to treat ovarian cancer, breast cancer, and other cancers. This medicine may be used for other purposes; ask your health care provider or pharmacist if you have questions. What should I tell my health care provider before I take this medicine? They need to know if you have any of these conditions: -blood disorders -irregular heartbeat -infection (especially a virus infection such as chickenpox, cold sores, or herpes) -liver disease -previous or ongoing radiation therapy -an unusual or allergic reaction to paclitaxel, alcohol, polyoxyethylated castor oil, other chemotherapy agents, other medicines, foods, dyes, or preservatives -pregnant or trying to get pregnant -breast-feeding How should I use this medicine? This drug is given as an infusion into a vein. It is administered in a hospital or clinic by a specially trained health care professional. Talk to your pediatrician regarding the use of this medicine in children. Special care may be needed. Overdosage: If you think you have taken too much of this medicine contact a poison control center or emergency room at once. NOTE: This medicine is only for you. Do not share this medicine with others. What if I miss a dose? It is important not to miss your dose. Call your doctor or health care professional if you are unable to keep an appointment. What may interact with this medicine? Do not take this medicine with any of the following  medications: -disulfiram -metronidazole This medicine may also interact with the following medications: -cyclosporine -diazepam -ketoconazole -medicines to increase blood counts like filgrastim, pegfilgrastim, sargramostim -other chemotherapy drugs like cisplatin, doxorubicin, epirubicin, etoposide, teniposide, vincristine -quinidine -testosterone -vaccines -verapamil Talk to your doctor or health care professional before taking any of these medicines: -acetaminophen -aspirin -ibuprofen -ketoprofen -naproxen This list may not describe all possible interactions. Give your health care provider a list of all the medicines, herbs, non-prescription drugs, or dietary supplements you use. Also tell them if you smoke, drink alcohol, or use illegal drugs. Some items may interact with your medicine. What should I watch for while using this medicine? Your condition will be monitored carefully while you are receiving this medicine. You will need important blood work done while you are taking this medicine. This drug may make you feel generally unwell. This is not uncommon, as chemotherapy can affect healthy cells as well as cancer cells. Report any side effects. Continue your course of treatment even though you feel ill unless your doctor tells you to stop. This medicine can cause serious allergic reactions. To reduce your risk you will need to take other medicine(s) before treatment with this medicine. In some cases, you may be given additional medicines to help with side effects. Follow all directions for their use. Call your doctor or health care professional for advice if you get a fever, chills or sore throat, or other symptoms of a cold or flu. Do not treat yourself. This drug decreases  your body's ability to fight infections. Try to avoid being around people who are sick. This medicine may increase your risk to bruise or bleed. Call your doctor or health care professional if you notice any  unusual bleeding. Be careful brushing and flossing your teeth or using a toothpick because you may get an infection or bleed more easily. If you have any dental work done, tell your dentist you are receiving this medicine. Avoid taking products that contain aspirin, acetaminophen, ibuprofen, naproxen, or ketoprofen unless instructed by your doctor. These medicines may hide a fever. Do not become pregnant while taking this medicine. Women should inform their doctor if they wish to become pregnant or think they might be pregnant. There is a potential for serious side effects to an unborn child. Talk to your health care professional or pharmacist for more information. Do not breast-feed an infant while taking this medicine. Men are advised not to father a child while receiving this medicine. This product may contain alcohol. Ask your pharmacist or healthcare provider if this medicine contains alcohol. Be sure to tell all healthcare providers you are taking this medicine. Certain medicines, like metronidazole and disulfiram, can cause an unpleasant reaction when taken with alcohol. The reaction includes flushing, headache, nausea, vomiting, sweating, and increased thirst. The reaction can last from 30 minutes to several hours. What side effects may I notice from receiving this medicine? Side effects that you should report to your doctor or health care professional as soon as possible: -allergic reactions like skin rash, itching or hives, swelling of the face, lips, or tongue -low blood counts - This drug may decrease the number of white blood cells, red blood cells and platelets. You may be at increased risk for infections and bleeding. -signs of infection - fever or chills, cough, sore throat, pain or difficulty passing urine -signs of decreased platelets or bleeding - bruising, pinpoint red spots on the skin, black, tarry stools, nosebleeds -signs of decreased red blood cells - unusually weak or tired,  fainting spells, lightheadedness -breathing problems -chest pain -high or low blood pressure -mouth sores -nausea and vomiting -pain, swelling, redness or irritation at the injection site -pain, tingling, numbness in the hands or feet -slow or irregular heartbeat -swelling of the ankle, feet, hands Side effects that usually do not require medical attention (report to your doctor or health care professional if they continue or are bothersome): -bone pain -complete hair loss including hair on your head, underarms, pubic hair, eyebrows, and eyelashes -changes in the color of fingernails -diarrhea -loosening of the fingernails -loss of appetite -muscle or joint pain -red flush to skin -sweating This list may not describe all possible side effects. Call your doctor for medical advice about side effects. You may report side effects to FDA at 1-800-FDA-1088. Where should I keep my medicine? This drug is given in a hospital or clinic and will not be stored at home. NOTE: This sheet is a summary. It may not cover all possible information. If you have questions about this medicine, talk to your doctor, pharmacist, or health care provider.    2016, Elsevier/Gold Standard. (2015-07-13 13:02:56)    Carboplatin injection What is this medicine? CARBOPLATIN (KAR boe pla tin) is a chemotherapy drug. It targets fast dividing cells, like cancer cells, and causes these cells to die. This medicine is used to treat ovarian cancer and many other cancers. This medicine may be used for other purposes; ask your health care provider or pharmacist if you have  questions. What should I tell my health care provider before I take this medicine? They need to know if you have any of these conditions: -blood disorders -hearing problems -kidney disease -recent or ongoing radiation therapy -an unusual or allergic reaction to carboplatin, cisplatin, other chemotherapy, other medicines, foods, dyes, or  preservatives -pregnant or trying to get pregnant -breast-feeding How should I use this medicine? This drug is usually given as an infusion into a vein. It is administered in a hospital or clinic by a specially trained health care professional. Talk to your pediatrician regarding the use of this medicine in children. Special care may be needed. Overdosage: If you think you have taken too much of this medicine contact a poison control center or emergency room at once. NOTE: This medicine is only for you. Do not share this medicine with others. What if I miss a dose? It is important not to miss a dose. Call your doctor or health care professional if you are unable to keep an appointment. What may interact with this medicine? -medicines for seizures -medicines to increase blood counts like filgrastim, pegfilgrastim, sargramostim -some antibiotics like amikacin, gentamicin, neomycin, streptomycin, tobramycin -vaccines Talk to your doctor or health care professional before taking any of these medicines: -acetaminophen -aspirin -ibuprofen -ketoprofen -naproxen This list may not describe all possible interactions. Give your health care provider a list of all the medicines, herbs, non-prescription drugs, or dietary supplements you use. Also tell them if you smoke, drink alcohol, or use illegal drugs. Some items may interact with your medicine. What should I watch for while using this medicine? Your condition will be monitored carefully while you are receiving this medicine. You will need important blood work done while you are taking this medicine. This drug may make you feel generally unwell. This is not uncommon, as chemotherapy can affect healthy cells as well as cancer cells. Report any side effects. Continue your course of treatment even though you feel ill unless your doctor tells you to stop. In some cases, you may be given additional medicines to help with side effects. Follow all directions  for their use. Call your doctor or health care professional for advice if you get a fever, chills or sore throat, or other symptoms of a cold or flu. Do not treat yourself. This drug decreases your body's ability to fight infections. Try to avoid being around people who are sick. This medicine may increase your risk to bruise or bleed. Call your doctor or health care professional if you notice any unusual bleeding. Be careful brushing and flossing your teeth or using a toothpick because you may get an infection or bleed more easily. If you have any dental work done, tell your dentist you are receiving this medicine. Avoid taking products that contain aspirin, acetaminophen, ibuprofen, naproxen, or ketoprofen unless instructed by your doctor. These medicines may hide a fever. Do not become pregnant while taking this medicine. Women should inform their doctor if they wish to become pregnant or think they might be pregnant. There is a potential for serious side effects to an unborn child. Talk to your health care professional or pharmacist for more information. Do not breast-feed an infant while taking this medicine. What side effects may I notice from receiving this medicine? Side effects that you should report to your doctor or health care professional as soon as possible: -allergic reactions like skin rash, itching or hives, swelling of the face, lips, or tongue -signs of infection - fever or chills,  cough, sore throat, pain or difficulty passing urine -signs of decreased platelets or bleeding - bruising, pinpoint red spots on the skin, black, tarry stools, nosebleeds -signs of decreased red blood cells - unusually weak or tired, fainting spells, lightheadedness -breathing problems -changes in hearing -changes in vision -chest pain -high blood pressure -low blood counts - This drug may decrease the number of white blood cells, red blood cells and platelets. You may be at increased risk for  infections and bleeding. -nausea and vomiting -pain, swelling, redness or irritation at the injection site -pain, tingling, numbness in the hands or feet -problems with balance, talking, walking -trouble passing urine or change in the amount of urine Side effects that usually do not require medical attention (report to your doctor or health care professional if they continue or are bothersome): -hair loss -loss of appetite -metallic taste in the mouth or changes in taste This list may not describe all possible side effects. Call your doctor for medical advice about side effects. You may report side effects to FDA at 1-800-FDA-1088. Where should I keep my medicine? This drug is given in a hospital or clinic and will not be stored at home. NOTE: This sheet is a summary. It may not cover all possible information. If you have questions about this medicine, talk to your doctor, pharmacist, or health care provider.    2016, [employer]/Gold Standard. (2008-03-01 14:38:05)

## 2016-05-27 NOTE — Progress Notes (Signed)
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 Pablo Ledger. This is expected to be completed on 05/07/2016. 2) systemic chemotherapy with carboplatin for AUC of 5 and paclitaxel 200 MG/M2 every 3 weeks. First dose 05/08/2016. Status post one cycle.  INTERVAL HISTORY: Jose Frank 81 y.o. male returns to the clinic today for follow-up visit accompanied by his wife, Haynes Dage. The patient is feeling fine today with no specific complaints. He tolerated the first cycle of his systemic chemotherapy with carboplatin and paclitaxel fairly well except for aching pain and fatigue after the Neulasta injection. He has no nausea or vomiting, no fever. The patient denied having any significant weight loss or night sweats. He has no headache or visual changes. He is today to start cycle #2 of his treatment.  MEDICAL HISTORY: Past Medical History  Diagnosis Date  . IHD (ischemic heart disease)     Remote PCI to distal RCA and atherectomy of th LAD in 1996; S/P PCI to LCX and RCA in 2010.  Marland Kitchen Hyperlipidemia   . Hypertension   . Renal insufficiency     on Diovan  . Hx of cardiovascular stress test     a.  ETT-Myoview 8/14:  No scar or ischemia, EF 52%, normal study;  b. ETT-Myoview:  EF 55%, no ST changes, no ischemia; Normal   . History of echocardiogram     Echo 8/14:  EF 55-60%, Gr 1 DD  . Submandibular lymphadenopathy 03/21/2016  . Cancer ([facility]) 1977    tonsil  . Metastatic adenocarcinoma ([facility]) 04/04/2016    ALLERGIES:  is allergic to  demerol.  MEDICATIONS:  Current Outpatient Prescriptions  Medication Sig Dispense Refill  . amLODipine (NORVASC) 5 MG tablet Take 5 mg by mouth daily.    Marland Kitchen aspirin 81 MG tablet Take 81 mg by mouth daily.      . clopidogrel (PLAVIX) 75 MG tablet Take 75 mg by mouth daily.    . diphenhydramine-acetaminophen (TYLENOL PM) 25-500 MG TABS tablet Take 1 tablet by mouth at bedtime as needed (sleep).     Marland Kitchen GLUCOSAMINE-CHONDROITIN PO Take 1 tablet by mouth 2 (two) times daily. Reported on 04/18/2016    . metoprolol (LOPRESSOR) 50 MG tablet Take 25-50 mg by mouth 2 (two) times daily. Reported on 05/07/2016    . Multiple Vitamin (MULTIVITAMIN) tablet Take 1 tablet by mouth daily.      Marland Kitchen MYRBETRIQ 50 MG TB24 tablet Take 50 mg by mouth daily.     . nitroGLYCERIN (NITROSTAT) 0.4 MG SL tablet Place 0.4 mg under the tongue every 5 (five) minutes as needed for chest pain (x 3 doses).    . prochlorperazine (COMPAZINE) 10 MG tablet Take 1 tablet (10 mg total) by mouth every 6 (six) hours as needed for nausea or vomiting. 30 tablet 0  . rosuvastatin (CRESTOR) 10 MG tablet Take 10 mg by mouth daily.    . tamsulosin (FLOMAX) 0.4 MG CAPS capsule Take 0.4 mg by mouth every evening.   11   No current facility-administered medications for this visit.    SURGICAL  HISTORY:  Past Surgical History  Procedure Laterality Date  . Cardiac catheterization  09/13/2009  . Cardiac catheterization  09/04/2009    EF 60%  . Tonsillectomy    . Pci  08/2009    TO THE LCX AND RCA  . Cardiovascular stress test  12/08/2007    EF 57%    REVIEW OF SYSTEMS:  A comprehensive review of systems was negative except for: Constitutional: positive for fatigue   PHYSICAL EXAMINATION: General appearance: alert, cooperative and no distress Head: Normocephalic, without obvious abnormality, atraumatic Neck: no carotid bruit, no JVD, supple, symmetrical, trachea midline, thyroid not enlarged, symmetric, no tenderness/mass/nodules and large  submandibular mass Lymph nodes: large submandibular mass Resp: clear to auscultation bilaterally Back: symmetric, no curvature. ROM normal. No CVA tenderness. Cardio: regular rate and rhythm, S1, S2 normal, no murmur, click, rub or gallop GI: soft, non-tender; bowel sounds normal; no masses,  no organomegaly Extremities: extremities normal, atraumatic, no cyanosis or edema Neurologic: Alert and oriented X 3, normal strength and tone. Normal symmetric reflexes. Normal coordination and gait  ECOG PERFORMANCE STATUS: 1 - Symptomatic but completely ambulatory  Blood pressure 135/90, pulse 65, temperature 98.3 F (36.8 C), temperature source Oral, resp. rate 18, height 6' (1.829 m), weight 183 lb (83.008 kg), SpO2 100 %.  LABORATORY DATA: Lab Results  Component Value Date   WBC 7.9 05/27/2016   HGB 13.3 05/27/2016   HCT 38.4 05/27/2016   MCV 92.1 05/27/2016   PLT 217 05/27/2016      Chemistry      Component Value Date/Time   NA 142 05/27/2016 1015   NA 143 04/02/2016 1120   K 4.3 05/27/2016 1015   K 4.3 04/02/2016 1120   CL 106 04/02/2016 1120   CO2 25 05/27/2016 1015   CO2 27 04/02/2016 1120   BUN 29.6* 05/27/2016 1015   BUN 25* 04/02/2016 1120   CREATININE 1.6* 05/27/2016 1015   CREATININE 1.50* 04/02/2016 1120      Component Value Date/Time   CALCIUM 9.3 05/27/2016 1015   CALCIUM 9.6 04/02/2016 1120   ALKPHOS 85 05/27/2016 1015   ALKPHOS 79 03/07/2016 1708   AST 22 05/27/2016 1015   AST 21 03/07/2016 1708   ALT 36 05/27/2016 1015   ALT 20 03/07/2016 1708   BILITOT 0.38 05/27/2016 1015   BILITOT 0.7 03/07/2016 1708       RADIOGRAPHIC STUDIES: No results found.  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 Status post palliative radiotherapy to the left seventh rib  lesion. The patient is currently undergoing systemic chemotherapy with carboplatin and paclitaxel is status post 1 cycle. He tolerated the first cycle of his treatment well. I recommended for him to proceed with cycle #2 today as a scheduled. He would come back for follow-up visit in 3 weeks for evaluation before starting cycle #3. He was advised to take pain medication and Claritin for the Neulasta pain. He was advised to call immediately if he has any concerning symptoms in the interval. The patient voices understanding of current disease status and treatment options and is in agreement with the current care plan.  All questions were answered. The patient knows to call the clinic with any problems, questions or concerns. We can certainly see the patient much sooner if necessary.  Disclaimer: This note was dictated with voice recognition  software. Similar sounding words can inadvertently be transcribed and may not be corrected upon review.

## 2016-05-29 ENCOUNTER — Ambulatory Visit (HOSPITAL_BASED_OUTPATIENT_CLINIC_OR_DEPARTMENT_OTHER): Payer: Medicare Other

## 2016-05-29 VITALS — BP 139/77 | HR 64 | Temp 97.8°F | Resp 18

## 2016-05-29 DIAGNOSIS — C770 Secondary and unspecified malignant neoplasm of lymph nodes of head, face and neck: Secondary | ICD-10-CM | POA: Diagnosis not present

## 2016-05-29 DIAGNOSIS — C081 Malignant neoplasm of sublingual gland: Secondary | ICD-10-CM

## 2016-05-29 DIAGNOSIS — C089 Malignant neoplasm of major salivary gland, unspecified: Secondary | ICD-10-CM

## 2016-05-29 DIAGNOSIS — C7951 Secondary malignant neoplasm of bone: Secondary | ICD-10-CM | POA: Diagnosis not present

## 2016-05-29 MED ORDER — PEGFILGRASTIM INJECTION 6 MG/0.6ML ~~LOC~~
6.0000 mg | PREFILLED_SYRINGE | Freq: Once | SUBCUTANEOUS | Status: AC
  Administered 2016-05-29: 6 mg via SUBCUTANEOUS
  Filled 2016-05-29: qty 0.6

## 2016-05-29 NOTE — Patient Instructions (Signed)
Pegfilgrastim injection What is this medicine? PEGFILGRASTIM (PEG fil gra stim) is a long-acting granulocyte colony-stimulating factor that stimulates the growth of neutrophils, a type of white blood cell important in the body's fight against infection. It is used to reduce the incidence of fever and infection in patients with certain types of cancer who are receiving chemotherapy that affects the bone marrow, and to increase survival after being exposed to high doses of radiation. This medicine may be used for other purposes; ask your health care provider or pharmacist if you have questions. What should I tell my health care provider before I take this medicine? They need to know if you have any of these conditions: -kidney disease -latex allergy -ongoing radiation therapy -sickle cell disease -skin reactions to acrylic adhesives (On-Body Injector only) -an unusual or allergic reaction to pegfilgrastim, filgrastim, other medicines, foods, dyes, or preservatives -pregnant or trying to get pregnant -breast-feeding How should I use this medicine? This medicine is for injection under the skin. If you get this medicine at home, you will be taught how to prepare and give the pre-filled syringe or how to use the On-body Injector. Refer to the patient Instructions for Use for detailed instructions. Use exactly as directed. Take your medicine at regular intervals. Do not take your medicine more often than directed. It is important that you put your used needles and syringes in a special sharps container. Do not put them in a trash can. If you do not have a sharps container, call your pharmacist or healthcare provider to get one. Talk to your pediatrician regarding the use of this medicine in children. While this drug may be prescribed for selected conditions, precautions do apply. Overdosage: If you think you have taken too much of this medicine contact a poison control center or emergency room at  once. NOTE: This medicine is only for you. Do not share this medicine with others. What if I miss a dose? It is important not to miss your dose. Call your doctor or health care professional if you miss your dose. If you miss a dose due to an On-body Injector failure or leakage, a new dose should be administered as soon as possible using a single prefilled syringe for manual use. What may interact with this medicine? Interactions have not been studied. Give your health care provider a list of all the medicines, herbs, non-prescription drugs, or dietary supplements you use. Also tell them if you smoke, drink alcohol, or use illegal drugs. Some items may interact with your medicine. This list may not describe all possible interactions. Give your health care provider a list of all the medicines, herbs, non-prescription drugs, or dietary supplements you use. Also tell them if you smoke, drink alcohol, or use illegal drugs. Some items may interact with your medicine. What should I watch for while using this medicine? You may need blood work done while you are taking this medicine. If you are going to need a MRI, CT scan, or other procedure, tell your doctor that you are using this medicine (On-Body Injector only). What side effects may I notice from receiving this medicine? Side effects that you should report to your doctor or health care professional as soon as possible: -allergic reactions like skin rash, itching or hives, swelling of the face, lips, or tongue -dizziness -fever -pain, redness, or irritation at site where injected -pinpoint red spots on the skin -red or dark-brown urine -shortness of breath or breathing problems -stomach or side pain, or pain   at the shoulder -swelling -tiredness -trouble passing urine or change in the amount of urine Side effects that usually do not require medical attention (report to your doctor or health care professional if they continue or are  bothersome): -bone pain -muscle pain This list may not describe all possible side effects. Call your doctor for medical advice about side effects. You may report side effects to FDA at 1-800-FDA-1088. Where should I keep my medicine? Keep out of the reach of children. Store pre-filled syringes in a refrigerator between 2 and 8 degrees C (36 and 46 degrees F). Do not freeze. Keep in carton to protect from light. Throw away this medicine if it is left out of the refrigerator for more than 48 hours. Throw away any unused medicine after the expiration date. NOTE: This sheet is a summary. It may not cover all possible information. If you have questions about this medicine, talk to your doctor, pharmacist, or health care provider.    2016, Elsevier/Gold Standard. (2014-12-15 14:30:14)  

## 2016-05-31 ENCOUNTER — Encounter: Payer: Self-pay | Admitting: Physician Assistant

## 2016-06-07 ENCOUNTER — Ambulatory Visit
Admission: RE | Admit: 2016-06-07 | Discharge: 2016-06-07 | Disposition: A | Discharge status: Home / Self Care | Payer: Medicare Other | Source: Ambulatory Visit | Attending: Radiation Oncology | Admitting: Radiation Oncology

## 2016-06-07 ENCOUNTER — Encounter: Payer: Self-pay | Admitting: Radiation Oncology

## 2016-06-07 VITALS — BP 128/84 | HR 73 | Temp 97.7°F | Ht 72.0 in | Wt 183.6 lb

## 2016-06-07 DIAGNOSIS — C799 Secondary malignant neoplasm of unspecified site: Secondary | ICD-10-CM

## 2016-06-07 DIAGNOSIS — C7951 Secondary malignant neoplasm of bone: Secondary | ICD-10-CM

## 2016-06-07 NOTE — Progress Notes (Signed)
  VSS.  He reports SOB on extreme exertion.  Denies any pain in prior tx field No skin changes noted.  Systemic chemotherapy with carboplatin for AUC of 5 and paclitaxel 200 MG/M2 every 3 weeks. First dose 05/08/2016 after 2nd on July 10,2017 then will be scheduled for a CT Scan. Status post one cycle.   He reports aching of knees and thighs at night which affects his ability to sleep which he attributes to chemotherapy - Neulasta given this month.

## 2016-06-08 DIAGNOSIS — C7951 Secondary malignant neoplasm of bone: Secondary | ICD-10-CM | POA: Insufficient documentation

## 2016-06-08 NOTE — Progress Notes (Signed)
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 Metastatic adenocarcinoma (HCC) 199.1 C79.9   2. Bone metastases (HCC) 198.5 C79.51     Interval Since Last Radiation: 1  month  04/23/16- 05/07/16: Left seventh rib treated to 30 Gy in 10 fractions  Narrative:  The patient returns today for routine follow-up. He tolerated radiotherapy well without incident. He is on systemic therapy and seems to be having some bone pains and myalgias in his legs from his regimen and growth factor. His rib pain has resolved. No other complaints are noted.                         ALLERGIES:  is allergic to demerol.  Meds: Current Outpatient Prescriptions  Medication Sig Dispense Refill  . amLODipine (NORVASC) 5 MG tablet Take 5 mg by mouth daily.    Marland Kitchen aspirin 81 MG tablet Take 81 mg by mouth daily.      . clopidogrel (PLAVIX) 75 MG tablet Take 75 mg by mouth daily.    . diphenhydramine-acetaminophen (TYLENOL PM) 25-500 MG TABS tablet Take 1 tablet by mouth at bedtime as needed (sleep).     . metoprolol (LOPRESSOR) 50 MG tablet Take 25-50 mg by mouth 2 (two) times daily. Reported on 05/07/2016    . Multiple Vitamin (MULTIVITAMIN) tablet Take 1 tablet by mouth daily.      Marland Kitchen MYRBETRIQ 50 MG TB24 tablet Take 50 mg by mouth daily.     . nitroGLYCERIN (NITROSTAT) 0.4 MG SL tablet Place 0.4 mg under the tongue every 5 (five) minutes as needed for chest pain (x 3 doses).    . prochlorperazine (COMPAZINE) 10 MG tablet Take 1 tablet (10 mg total) by mouth every 6 (six) hours as needed for nausea  or vomiting. 30 tablet 0  . rosuvastatin (CRESTOR) 10 MG tablet Take 10 mg by mouth daily.    . tamsulosin (FLOMAX) 0.4 MG CAPS capsule Take 0.4 mg by mouth every evening.   11  . GLUCOSAMINE-CHONDROITIN PO Take 1 tablet by mouth 2 (two) times daily. Reported on 06/07/2016     No current facility-administered medications for this encounter.    Physical Findings:  height is 6' (1.829 m) and weight is 183 lb 9.6 oz (83.28 kg). His temperature is 97.7 F (36.5 C). His blood pressure is 128/84 and his pulse is 73. His oxygen saturation is 99%.   In general this is a well appearing Caucasian male in no acute distress. He's alert and oriented x4 and appropriate throughout the examination. Cardiopulmonary assessment is negative for acute distress and he exhibits normal effort. Hisskin along the chest wall is intact without desquamation.   Lab Findings: Lab Results  Component Value Date   WBC 7.9 05/27/2016   WBC 8.0 04/02/2016   HGB 13.3 05/27/2016   HGB 14.5 04/02/2016   HCT 38.4 05/27/2016   HCT 41.1 04/02/2016   PLT 217 05/27/2016   PLT 218 04/02/2016    Lab Results  Component Value Date   NA 142 05/27/2016   NA  143 04/02/2016   K 4.3 05/27/2016   K 4.3 04/02/2016   CHLORIDE 108 05/27/2016   CO2 25 05/27/2016   CO2 27 04/02/2016   GLUCOSE 105 05/27/2016   GLUCOSE 107* 04/02/2016   BUN 29.6* 05/27/2016   BUN 25* 04/02/2016   CREATININE 1.6* 05/27/2016   CREATININE 1.50* 04/02/2016   BILITOT 0.38 05/27/2016   BILITOT 0.7 03/07/2016   ALKPHOS 85 05/27/2016   ALKPHOS 79 03/07/2016   AST 22 05/27/2016   AST 21 03/07/2016   ALT 36 05/27/2016   ALT 20 03/07/2016   PROT 7.1 05/27/2016   PROT 7.0 03/07/2016   ALBUMIN 3.7 05/27/2016   ALBUMIN 3.6 03/07/2016   CALCIUM 9.3 05/27/2016   CALCIUM 9.6 04/02/2016   ANIONGAP 8 05/27/2016   ANIONGAP 10 04/02/2016    Radiographic Findings: No results found.  Impression/Plan: 1. Metastatic adenocarcinoma of unknown primary  questionable for salivary gland carcinoma involving the submandibular gland in addition to metastatic bone lesion in the left seventh rib diagnosed in April 2017. The patient has completed his radiotherapy and continues systemic therapoy with Carboplatin and Taxol. We will follow the patient on an as needed basis, but would be happy to see him back if he has questions or concerns regarding his previous radiotherapy. 2. Taxol induced myalgias. I encouraged the patient to discuss this with Dr. Julien Nordmann but to consider premedicating with ibuprofen if his kidney function is appropriate during next lab values. He will continue his claritin.     Carola Rhine, PAC

## 2016-06-10 ENCOUNTER — Telehealth: Payer: Self-pay | Admitting: *Deleted

## 2016-06-10 ENCOUNTER — Other Ambulatory Visit (HOSPITAL_BASED_OUTPATIENT_CLINIC_OR_DEPARTMENT_OTHER): Payer: Medicare Other

## 2016-06-10 DIAGNOSIS — C801 Malignant (primary) neoplasm, unspecified: Secondary | ICD-10-CM

## 2016-06-10 DIAGNOSIS — D490 Neoplasm of unspecified behavior of digestive system: Secondary | ICD-10-CM

## 2016-06-10 DIAGNOSIS — C7989 Secondary malignant neoplasm of other specified sites: Secondary | ICD-10-CM

## 2016-06-10 DIAGNOSIS — C089 Malignant neoplasm of major salivary gland, unspecified: Secondary | ICD-10-CM

## 2016-06-10 LAB — CBC WITH DIFFERENTIAL/PLATELET
BASO%: 0.5 % (ref 0.0–2.0)
Basophils Absolute: 0.1 10*3/uL (ref 0.0–0.1)
EOS ABS: 0.2 10*3/uL (ref 0.0–0.5)
EOS%: 1.2 % (ref 0.0–7.0)
HEMATOCRIT: 38.5 % (ref 38.4–49.9)
HEMOGLOBIN: 12.6 g/dL — AB (ref 13.0–17.1)
LYMPH#: 1.2 10*3/uL (ref 0.9–3.3)
LYMPH%: 7.8 % — ABNORMAL LOW (ref 14.0–49.0)
MCH: 31.2 pg (ref 27.2–33.4)
MCHC: 32.8 g/dL (ref 32.0–36.0)
MCV: 95.2 fL (ref 79.3–98.0)
MONO#: 1 10*3/uL — AB (ref 0.1–0.9)
MONO%: 6.8 % (ref 0.0–14.0)
NEUT%: 83.7 % — ABNORMAL HIGH (ref 39.0–75.0)
NEUTROS ABS: 12.8 10*3/uL — AB (ref 1.5–6.5)
PLATELETS: 156 10*3/uL (ref 140–400)
RBC: 4.04 10*6/uL — AB (ref 4.20–5.82)
RDW: 15.1 % — ABNORMAL HIGH (ref 11.0–14.6)
WBC: 15.2 10*3/uL — AB (ref 4.0–10.3)

## 2016-06-10 LAB — COMPREHENSIVE METABOLIC PANEL
ALT: 33 U/L (ref 0–55)
ANION GAP: 9 meq/L (ref 3–11)
AST: 23 U/L (ref 5–34)
Albumin: 3.5 g/dL (ref 3.5–5.0)
Alkaline Phosphatase: 123 U/L (ref 40–150)
BUN: 25.6 mg/dL (ref 7.0–26.0)
CHLORIDE: 107 meq/L (ref 98–109)
CO2: 26 meq/L (ref 22–29)
CREATININE: 1.6 mg/dL — AB (ref 0.7–1.3)
Calcium: 9.3 mg/dL (ref 8.4–10.4)
EGFR: 41 mL/min/{1.73_m2} — ABNORMAL LOW (ref >90)
Glucose: 117 mg/dl (ref 70–140)
Potassium: 4.6 mEq/L (ref 3.5–5.1)
Sodium: 141 mEq/L (ref 136–145)
Total Bilirubin: <0.3 mg/dL (ref 0.20–1.20)
Total Protein: 6.8 g/dL (ref 6.4–8.3)

## 2016-06-10 MED ORDER — OXYCODONE-ACETAMINOPHEN 5-325 MG PO TABS: 1.0000 | ORAL_TABLET | Freq: Four times a day (QID) | ORAL | Status: DC | PRN

## 2016-06-10 NOTE — Telephone Encounter (Signed)
Oncology Nurse Navigator Documentation  Oncology Nurse Navigator Flowsheets 06/10/2016  Navigator Location [facility]  Navigator Encounter Type Telephone  Telephone Outgoing Call  Treatment Phase Treatment  Barriers/Navigation Needs Education  Education Other  Interventions Education Method  Acuity Level 1  Time Spent with Patient 30   I received a message from [employer] regarding patient having pain after Neulasta shot.  I called but was unable to reach.  I left vm message to call cancer center with the phone number.

## 2016-06-10 NOTE — Telephone Encounter (Signed)
Pt here for lab appt, c/o leg pain. Reviewed with MD VO for percocet. Next f/u  Rx given to pt in waiting room.

## 2016-06-17 ENCOUNTER — Other Ambulatory Visit (HOSPITAL_BASED_OUTPATIENT_CLINIC_OR_DEPARTMENT_OTHER): Payer: Medicare Other

## 2016-06-17 ENCOUNTER — Encounter: Payer: Self-pay | Admitting: Internal Medicine

## 2016-06-17 ENCOUNTER — Telehealth: Payer: Self-pay | Admitting: Internal Medicine

## 2016-06-17 ENCOUNTER — Ambulatory Visit (HOSPITAL_BASED_OUTPATIENT_CLINIC_OR_DEPARTMENT_OTHER): Payer: Medicare Other | Admitting: Internal Medicine

## 2016-06-17 ENCOUNTER — Ambulatory Visit (HOSPITAL_BASED_OUTPATIENT_CLINIC_OR_DEPARTMENT_OTHER): Payer: Medicare Other

## 2016-06-17 VITALS — BP 137/77 | HR 77 | Temp 98.3°F | Resp 18 | Ht 72.0 in | Wt 183.6 lb

## 2016-06-17 DIAGNOSIS — C770 Secondary and unspecified malignant neoplasm of lymph nodes of head, face and neck: Secondary | ICD-10-CM | POA: Diagnosis not present

## 2016-06-17 DIAGNOSIS — C089 Malignant neoplasm of major salivary gland, unspecified: Secondary | ICD-10-CM

## 2016-06-17 DIAGNOSIS — C801 Malignant (primary) neoplasm, unspecified: Secondary | ICD-10-CM | POA: Diagnosis not present

## 2016-06-17 DIAGNOSIS — Z5111 Encounter for antineoplastic chemotherapy: Secondary | ICD-10-CM

## 2016-06-17 DIAGNOSIS — C7951 Secondary malignant neoplasm of bone: Secondary | ICD-10-CM

## 2016-06-17 DIAGNOSIS — D490 Neoplasm of unspecified behavior of digestive system: Secondary | ICD-10-CM

## 2016-06-17 LAB — CBC WITH DIFFERENTIAL/PLATELET
BASO%: 1.3 % (ref 0.0–2.0)
Basophils Absolute: 0.1 10*3/uL (ref 0.0–0.1)
EOS%: 0.5 % (ref 0.0–7.0)
Eosinophils Absolute: 0 10*3/uL (ref 0.0–0.5)
HCT: 38.3 % — ABNORMAL LOW (ref 38.4–49.9)
HGB: 12.9 g/dL — ABNORMAL LOW (ref 13.0–17.1)
LYMPH%: 13 % — AB (ref 14.0–49.0)
MCH: 31.7 pg (ref 27.2–33.4)
MCHC: 33.7 g/dL (ref 32.0–36.0)
MCV: 94 fL (ref 79.3–98.0)
MONO#: 0.8 10*3/uL (ref 0.1–0.9)
MONO%: 9 % (ref 0.0–14.0)
NEUT%: 76.2 % — ABNORMAL HIGH (ref 39.0–75.0)
NEUTROS ABS: 6.8 10*3/uL — AB (ref 1.5–6.5)
Platelets: 230 10*3/uL (ref 140–400)
RBC: 4.08 10*6/uL — AB (ref 4.20–5.82)
RDW: 14.6 % (ref 11.0–14.6)
WBC: 8.9 10*3/uL (ref 4.0–10.3)
lymph#: 1.2 10*3/uL (ref 0.9–3.3)

## 2016-06-17 LAB — COMPREHENSIVE METABOLIC PANEL
ALT: 22 U/L (ref 0–55)
ANION GAP: 7 meq/L (ref 3–11)
AST: 18 U/L (ref 5–34)
Albumin: 3.5 g/dL (ref 3.5–5.0)
Alkaline Phosphatase: 85 U/L (ref 40–150)
BILIRUBIN TOTAL: 0.32 mg/dL (ref 0.20–1.20)
BUN: 26.1 mg/dL — ABNORMAL HIGH (ref 7.0–26.0)
CALCIUM: 9.5 mg/dL (ref 8.4–10.4)
CHLORIDE: 109 meq/L (ref 98–109)
CO2: 25 mEq/L (ref 22–29)
CREATININE: 1.5 mg/dL — AB (ref 0.7–1.3)
EGFR: 42 mL/min/{1.73_m2} — ABNORMAL LOW (ref >90)
Glucose: 103 mg/dl (ref 70–140)
Potassium: 4.8 mEq/L (ref 3.5–5.1)
Sodium: 141 mEq/L (ref 136–145)
Total Protein: 6.8 g/dL (ref 6.4–8.3)

## 2016-06-17 MED ORDER — SODIUM CHLORIDE 0.9 % IV SOLN
360.0000 mg | Freq: Once | INTRAVENOUS | Status: AC
  Administered 2016-06-17: 360 mg via INTRAVENOUS
  Filled 2016-06-17: qty 36

## 2016-06-17 MED ORDER — DIPHENHYDRAMINE HCL 50 MG/ML IJ SOLN
INTRAMUSCULAR | Status: AC
  Filled 2016-06-17: qty 1

## 2016-06-17 MED ORDER — PALONOSETRON HCL INJECTION 0.25 MG/5ML
0.2500 mg | Freq: Once | INTRAVENOUS | Status: AC
  Administered 2016-06-17: 0.25 mg via INTRAVENOUS

## 2016-06-17 MED ORDER — SODIUM CHLORIDE 0.9 % IV SOLN: 430.8000 mg | Freq: Once | INTRAVENOUS | Status: DC

## 2016-06-17 MED ORDER — SODIUM CHLORIDE 0.9 % IV SOLN
Freq: Once | INTRAVENOUS | Status: AC
  Administered 2016-06-17: 11:00:00 via INTRAVENOUS

## 2016-06-17 MED ORDER — DIPHENHYDRAMINE HCL 50 MG/ML IJ SOLN
50.0000 mg | Freq: Once | INTRAMUSCULAR | Status: AC
  Administered 2016-06-17: 50 mg via INTRAVENOUS

## 2016-06-17 MED ORDER — FAMOTIDINE IN NACL 20-0.9 MG/50ML-% IV SOLN
INTRAVENOUS | Status: AC
  Filled 2016-06-17: qty 50

## 2016-06-17 MED ORDER — SODIUM CHLORIDE 0.9 % IV SOLN
20.0000 mg | Freq: Once | INTRAVENOUS | Status: AC
  Administered 2016-06-17: 20 mg via INTRAVENOUS
  Filled 2016-06-17: qty 2

## 2016-06-17 MED ORDER — FAMOTIDINE IN NACL 20-0.9 MG/50ML-% IV SOLN
20.0000 mg | Freq: Once | INTRAVENOUS | Status: AC
  Administered 2016-06-17: 20 mg via INTRAVENOUS

## 2016-06-17 MED ORDER — SODIUM CHLORIDE 0.9 % IV SOLN
200.0000 mg/m2 | Freq: Once | INTRAVENOUS | Status: AC
  Administered 2016-06-17: 408 mg via INTRAVENOUS
  Filled 2016-06-17: qty 68

## 2016-06-17 MED ORDER — PALONOSETRON HCL INJECTION 0.25 MG/5ML
INTRAVENOUS | Status: AC
  Filled 2016-06-17: qty 5

## 2016-06-17 NOTE — Patient Instructions (Signed)
[facility] Discharge Instructions for Patients Receiving Chemotherapy  Today you received the following chemotherapy agents Taxol/Carboplatin  To help prevent nausea and vomiting after your treatment, we encourage you to take your nausea medication    If you develop nausea and vomiting that is not controlled by your nausea medication, call the clinic.   BELOW ARE SYMPTOMS THAT SHOULD BE REPORTED IMMEDIATELY:  *FEVER GREATER THAN 100.5 F  *CHILLS WITH OR WITHOUT FEVER  NAUSEA AND VOMITING THAT IS NOT CONTROLLED WITH YOUR NAUSEA MEDICATION  *UNUSUAL SHORTNESS OF BREATH  *UNUSUAL BRUISING OR BLEEDING  TENDERNESS IN MOUTH AND THROAT WITH OR WITHOUT PRESENCE OF ULCERS  *URINARY PROBLEMS  *BOWEL PROBLEMS  UNUSUAL RASH Items with * indicate a potential emergency and should be followed up as soon as possible.  Feel free to call the clinic you have any questions or concerns. The clinic phone number is [phone].  Please show the CHEMO ALERT CARD at check-in to the Emergency Department and triage nurse.   

## 2016-06-17 NOTE — Telephone Encounter (Signed)
per pof to sch pt appt-gave pt copy of avs °

## 2016-06-17 NOTE — Progress Notes (Signed)
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 Pablo Ledger. This is expected to be completed on 05/07/2016. 2) systemic chemotherapy with carboplatin for AUC of 5 and paclitaxel 200 MG/M2 every 3 weeks. First dose 05/08/2016. Status post 2 cycles.  INTERVAL HISTORY: Jose Frank 81 y.o. male returns to the clinic today for follow-up visit accompanied by his wife, Haynes Dage. The patient is feeling fine today with no specific complaints except for aching pain in the lower extremities. He tolerated the second cycle of his systemic chemotherapy with carboplatin and paclitaxel fairly well except for aching pain and fatigue after the Neulasta injection. He has no nausea or vomiting, no fever. The patient denied having any significant weight loss or night sweats. He notices that the submandibular mass is getting more firm compared to before. He has no headache or visual changes. He is today to start cycle #3 of his treatment.  MEDICAL HISTORY: Past Medical History  Diagnosis Date  . IHD (ischemic heart disease)     Remote PCI to distal RCA and atherectomy of th LAD in 1996; S/P PCI to LCX and RCA in 2010.  Marland Kitchen Hyperlipidemia   . Hypertension   . Renal insufficiency     on Diovan  . Hx of cardiovascular stress test     a.  ETT-Myoview 8/14:  No scar or ischemia, EF 52%, normal study;  b. ETT-Myoview:  EF 55%, no ST changes, no ischemia; Normal   . History of echocardiogram     Echo 8/14:  EF 55-60%, Gr 1 DD  . Submandibular lymphadenopathy  03/21/2016  . Cancer ([facility]) 1977    tonsil  . Metastatic adenocarcinoma ([facility]) 04/04/2016  . Encounter for antineoplastic chemotherapy 05/27/2016    ALLERGIES:  is allergic to demerol.  MEDICATIONS:  Current Outpatient Prescriptions  Medication Sig Dispense Refill  . amLODipine (NORVASC) 5 MG tablet Take 5 mg by mouth daily.    Marland Kitchen aspirin 81 MG tablet Take 81 mg by mouth daily.      . clopidogrel (PLAVIX) 75 MG tablet Take 75 mg by mouth daily.    . diphenhydramine-acetaminophen (TYLENOL PM) 25-500 MG TABS tablet Take 1 tablet by mouth at bedtime as needed (sleep).     Marland Kitchen GLUCOSAMINE-CHONDROITIN PO Take 1 tablet by mouth 2 (two) times daily. Reported on 06/07/2016    . metoprolol (LOPRESSOR) 50 MG tablet Take 25-50 mg by mouth 2 (two) times daily. Reported on 05/07/2016    . Multiple Vitamin (MULTIVITAMIN) tablet Take 1 tablet by mouth daily.      Marland Kitchen MYRBETRIQ 50 MG TB24 tablet Take 50 mg by mouth daily.     . nitroGLYCERIN (NITROSTAT) 0.4 MG SL tablet Place 0.4 mg under the tongue every 5 (five) minutes as needed for chest pain (x 3 doses).    Marland Kitchen oxyCODONE-acetaminophen (PERCOCET/ROXICET) 5-325 MG tablet Take 1 tablet by mouth every 6 (six) hours as needed for severe pain. 30 tablet 0  . prochlorperazine (COMPAZINE) 10 MG tablet Take 1 tablet (10 mg total) by mouth every 6 (six) hours as needed for  nausea or vomiting. 30 tablet 0  . rosuvastatin (CRESTOR) 10 MG tablet Take 10 mg by mouth daily.    . tamsulosin (FLOMAX) 0.4 MG CAPS capsule Take 0.4 mg by mouth every evening.   11   No current facility-administered medications for this visit.    SURGICAL HISTORY:  Past Surgical History  Procedure Laterality Date  . Cardiac catheterization  09/13/2009  . Cardiac catheterization  09/04/2009    EF 60%  . Tonsillectomy    . Pci  08/2009    TO THE LCX AND RCA  . Cardiovascular stress test  12/08/2007    EF 57%    REVIEW OF SYSTEMS:  A comprehensive review of systems was negative except for:  Constitutional: positive for fatigue Musculoskeletal: positive for arthralgias   PHYSICAL EXAMINATION: General appearance: alert, cooperative and no distress Head: Normocephalic, without obvious abnormality, atraumatic Neck: no carotid bruit, no JVD, supple, symmetrical, trachea midline, thyroid not enlarged, symmetric, no tenderness/mass/nodules and large submandibular mass Lymph nodes: large submandibular mass Resp: clear to auscultation bilaterally Back: symmetric, no curvature. ROM normal. No CVA tenderness. Cardio: regular rate and rhythm, S1, S2 normal, no murmur, click, rub or gallop GI: soft, non-tender; bowel sounds normal; no masses,  no organomegaly Extremities: extremities normal, atraumatic, no cyanosis or edema Neurologic: Alert and oriented X 3, normal strength and tone. Normal symmetric reflexes. Normal coordination and gait  ECOG PERFORMANCE STATUS: 1 - Symptomatic but completely ambulatory  Blood pressure 137/77, pulse 77, temperature 98.3 F (36.8 C), temperature source Oral, resp. rate 18, height 6' (1.829 m), weight 183 lb 9.6 oz (83.28 kg), SpO2 100 %.  LABORATORY DATA: Lab Results  Component Value Date   WBC 8.9 06/17/2016   HGB 12.9* 06/17/2016   HCT 38.3* 06/17/2016   MCV 94.0 06/17/2016   PLT 230 06/17/2016      Chemistry      Component Value Date/Time   NA 141 06/17/2016 1006   NA 143 04/02/2016 1120   K 4.8 06/17/2016 1006   K 4.3 04/02/2016 1120   CL 106 04/02/2016 1120   CO2 25 06/17/2016 1006   CO2 27 04/02/2016 1120   BUN 26.1* 06/17/2016 1006   BUN 25* 04/02/2016 1120   CREATININE 1.5* 06/17/2016 1006   CREATININE 1.50* 04/02/2016 1120      Component Value Date/Time   CALCIUM 9.5 06/17/2016 1006   CALCIUM 9.6 04/02/2016 1120   ALKPHOS 85 06/17/2016 1006   ALKPHOS 79 03/07/2016 1708   AST 18 06/17/2016 1006   AST 21 03/07/2016 1708   ALT 22 06/17/2016 1006   ALT 20 03/07/2016 1708   BILITOT 0.32 06/17/2016 1006   BILITOT 0.7  03/07/2016 1708       RADIOGRAPHIC STUDIES: No results found.  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 Status post palliative radiotherapy to the left seventh rib lesion. The patient is currently undergoing systemic chemotherapy with carboplatin and paclitaxel is status post 2 cycles. He tolerated the first 2 cycles of his treatment well except for the fatigue and aching pain in the lower extremities partially secondary to Neulasta injection. He was advised to take Claritin and pain medication as needed. I recommended for him to proceed with cycle #3 today as a scheduled. He would come back for follow-up visit in 3 weeks for evaluation before starting cycle #  3 after repeating CT scan of the neck and chest for restaging of his disease.  He was advised to call immediately if he has any concerning symptoms in the interval. The patient voices understanding of current disease status and treatment options and is in agreement with the current care plan.  All questions were answered. The patient knows to call the clinic with any problems, questions or concerns. We can certainly see the patient much sooner if necessary.  Disclaimer: This note was dictated with voice recognition software. Similar sounding words can inadvertently be transcribed and may not be corrected upon review.

## 2016-06-19 ENCOUNTER — Ambulatory Visit (HOSPITAL_BASED_OUTPATIENT_CLINIC_OR_DEPARTMENT_OTHER): Payer: Medicare Other

## 2016-06-19 ENCOUNTER — Other Ambulatory Visit: Payer: Self-pay | Admitting: Medical Oncology

## 2016-06-19 VITALS — BP 122/78 | HR 71 | Temp 97.8°F | Resp 20

## 2016-06-19 DIAGNOSIS — C089 Malignant neoplasm of major salivary gland, unspecified: Secondary | ICD-10-CM

## 2016-06-19 DIAGNOSIS — C770 Secondary and unspecified malignant neoplasm of lymph nodes of head, face and neck: Secondary | ICD-10-CM

## 2016-06-19 DIAGNOSIS — C801 Malignant (primary) neoplasm, unspecified: Secondary | ICD-10-CM | POA: Diagnosis present

## 2016-06-19 DIAGNOSIS — C7951 Secondary malignant neoplasm of bone: Secondary | ICD-10-CM

## 2016-06-19 MED ORDER — PEGFILGRASTIM INJECTION 6 MG/0.6ML ~~LOC~~
6.0000 mg | PREFILLED_SYRINGE | Freq: Once | SUBCUTANEOUS | Status: AC
  Administered 2016-06-19: 6 mg via SUBCUTANEOUS
  Filled 2016-06-19: qty 0.6

## 2016-06-19 NOTE — Patient Instructions (Signed)
Pegfilgrastim injection What is this medicine? PEGFILGRASTIM (PEG fil gra stim) is a long-acting granulocyte colony-stimulating factor that stimulates the growth of neutrophils, a type of white blood cell important in the body's fight against infection. It is used to reduce the incidence of fever and infection in patients with certain types of cancer who are receiving chemotherapy that affects the bone marrow, and to increase survival after being exposed to high doses of radiation. This medicine may be used for other purposes; ask your health care provider or pharmacist if you have questions. What should I tell my health care provider before I take this medicine? They need to know if you have any of these conditions: -kidney disease -latex allergy -ongoing radiation therapy -sickle cell disease -skin reactions to acrylic adhesives (On-Body Injector only) -an unusual or allergic reaction to pegfilgrastim, filgrastim, other medicines, foods, dyes, or preservatives -pregnant or trying to get pregnant -breast-feeding How should I use this medicine? This medicine is for injection under the skin. If you get this medicine at home, you will be taught how to prepare and give the pre-filled syringe or how to use the On-body Injector. Refer to the patient Instructions for Use for detailed instructions. Use exactly as directed. Take your medicine at regular intervals. Do not take your medicine more often than directed. It is important that you put your used needles and syringes in a special sharps container. Do not put them in a trash can. If you do not have a sharps container, call your pharmacist or healthcare provider to get one. Talk to your pediatrician regarding the use of this medicine in children. While this drug may be prescribed for selected conditions, precautions do apply. Overdosage: If you think you have taken too much of this medicine contact a poison control center or emergency room at  once. NOTE: This medicine is only for you. Do not share this medicine with others. What if I miss a dose? It is important not to miss your dose. Call your doctor or health care professional if you miss your dose. If you miss a dose due to an On-body Injector failure or leakage, a new dose should be administered as soon as possible using a single prefilled syringe for manual use. What may interact with this medicine? Interactions have not been studied. Give your health care provider a list of all the medicines, herbs, non-prescription drugs, or dietary supplements you use. Also tell them if you smoke, drink alcohol, or use illegal drugs. Some items may interact with your medicine. This list may not describe all possible interactions. Give your health care provider a list of all the medicines, herbs, non-prescription drugs, or dietary supplements you use. Also tell them if you smoke, drink alcohol, or use illegal drugs. Some items may interact with your medicine. What should I watch for while using this medicine? You may need blood work done while you are taking this medicine. If you are going to need a MRI, CT scan, or other procedure, tell your doctor that you are using this medicine (On-Body Injector only). What side effects may I notice from receiving this medicine? Side effects that you should report to your doctor or health care professional as soon as possible: -allergic reactions like skin rash, itching or hives, swelling of the face, lips, or tongue -dizziness -fever -pain, redness, or irritation at site where injected -pinpoint red spots on the skin -red or dark-brown urine -shortness of breath or breathing problems -stomach or side pain, or pain   at the shoulder -swelling -tiredness -trouble passing urine or change in the amount of urine Side effects that usually do not require medical attention (report to your doctor or health care professional if they continue or are  bothersome): -bone pain -muscle pain This list may not describe all possible side effects. Call your doctor for medical advice about side effects. You may report side effects to FDA at 1-800-FDA-1088. Where should I keep my medicine? Keep out of the reach of children. Store pre-filled syringes in a refrigerator between 2 and 8 degrees C (36 and 46 degrees F). Do not freeze. Keep in carton to protect from light. Throw away this medicine if it is left out of the refrigerator for more than 48 hours. Throw away any unused medicine after the expiration date. NOTE: This sheet is a summary. It may not cover all possible information. If you have questions about this medicine, talk to your doctor, pharmacist, or health care provider.    2016, Elsevier/Gold Standard. (2014-12-15 14:30:14)  

## 2016-06-24 ENCOUNTER — Other Ambulatory Visit (HOSPITAL_BASED_OUTPATIENT_CLINIC_OR_DEPARTMENT_OTHER): Payer: Medicare Other

## 2016-06-24 DIAGNOSIS — C801 Malignant (primary) neoplasm, unspecified: Secondary | ICD-10-CM

## 2016-06-24 DIAGNOSIS — D490 Neoplasm of unspecified behavior of digestive system: Secondary | ICD-10-CM

## 2016-06-24 DIAGNOSIS — C089 Malignant neoplasm of major salivary gland, unspecified: Secondary | ICD-10-CM

## 2016-06-24 LAB — COMPREHENSIVE METABOLIC PANEL
ALBUMIN: 3.7 g/dL (ref 3.5–5.0)
ALK PHOS: 85 U/L (ref 40–150)
ALT: 34 U/L (ref 0–55)
ANION GAP: 8 meq/L (ref 3–11)
AST: 19 U/L (ref 5–34)
BILIRUBIN TOTAL: 0.56 mg/dL (ref 0.20–1.20)
BUN: 34.3 mg/dL — ABNORMAL HIGH (ref 7.0–26.0)
CALCIUM: 9.5 mg/dL (ref 8.4–10.4)
CHLORIDE: 106 meq/L (ref 98–109)
CO2: 26 mEq/L (ref 22–29)
CREATININE: 1.7 mg/dL — AB (ref 0.7–1.3)
EGFR: 38 mL/min/{1.73_m2} — ABNORMAL LOW (ref >90)
Glucose: 100 mg/dl (ref 70–140)
Potassium: 5.2 mEq/L — ABNORMAL HIGH (ref 3.5–5.1)
Sodium: 139 mEq/L (ref 136–145)
Total Protein: 6.8 g/dL (ref 6.4–8.3)

## 2016-06-24 LAB — CBC WITH DIFFERENTIAL/PLATELET
BASO%: 1.3 % (ref 0.0–2.0)
BASOS ABS: 0 10*3/uL (ref 0.0–0.1)
EOS ABS: 0.2 10*3/uL (ref 0.0–0.5)
EOS%: 9.6 % — ABNORMAL HIGH (ref 0.0–7.0)
HEMATOCRIT: 36.6 % — AB (ref 38.4–49.9)
HEMOGLOBIN: 12.3 g/dL — AB (ref 13.0–17.1)
LYMPH#: 0.6 10*3/uL — AB (ref 0.9–3.3)
LYMPH%: 35.9 % (ref 14.0–49.0)
MCH: 32.1 pg (ref 27.2–33.4)
MCHC: 33.6 g/dL (ref 32.0–36.0)
MCV: 95.4 fL (ref 79.3–98.0)
MONO#: 0.3 10*3/uL (ref 0.1–0.9)
MONO%: 15.2 % — ABNORMAL HIGH (ref 0.0–14.0)
NEUT#: 0.7 10*3/uL — ABNORMAL LOW (ref 1.5–6.5)
NEUT%: 38 % — AB (ref 39.0–75.0)
PLATELETS: 154 10*3/uL (ref 140–400)
RBC: 3.84 10*6/uL — ABNORMAL LOW (ref 4.20–5.82)
RDW: 15.3 % — AB (ref 11.0–14.6)
WBC: 1.8 10*3/uL — ABNORMAL LOW (ref 4.0–10.3)

## 2016-06-28 ENCOUNTER — Other Ambulatory Visit: Payer: Self-pay | Admitting: Cardiovascular Disease

## 2016-07-01 ENCOUNTER — Other Ambulatory Visit (HOSPITAL_BASED_OUTPATIENT_CLINIC_OR_DEPARTMENT_OTHER): Payer: Medicare Other

## 2016-07-01 DIAGNOSIS — C801 Malignant (primary) neoplasm, unspecified: Secondary | ICD-10-CM | POA: Diagnosis present

## 2016-07-01 DIAGNOSIS — D490 Neoplasm of unspecified behavior of digestive system: Secondary | ICD-10-CM

## 2016-07-01 DIAGNOSIS — C089 Malignant neoplasm of major salivary gland, unspecified: Secondary | ICD-10-CM

## 2016-07-01 LAB — COMPREHENSIVE METABOLIC PANEL
ALBUMIN: 3.6 g/dL (ref 3.5–5.0)
ALK PHOS: 91 U/L (ref 40–150)
ALT: 20 U/L (ref 0–55)
AST: 18 U/L (ref 5–34)
Anion Gap: 8 mEq/L (ref 3–11)
BUN: 26.6 mg/dL — AB (ref 7.0–26.0)
CALCIUM: 9.4 mg/dL (ref 8.4–10.4)
CO2: 26 mEq/L (ref 22–29)
CREATININE: 1.7 mg/dL — AB (ref 0.7–1.3)
Chloride: 106 mEq/L (ref 98–109)
EGFR: 37 mL/min/{1.73_m2} — ABNORMAL LOW (ref >90)
GLUCOSE: 108 mg/dL (ref 70–140)
POTASSIUM: 5 meq/L (ref 3.5–5.1)
SODIUM: 140 meq/L (ref 136–145)
TOTAL PROTEIN: 6.8 g/dL (ref 6.4–8.3)
Total Bilirubin: 0.34 mg/dL (ref 0.20–1.20)

## 2016-07-01 LAB — CBC WITH DIFFERENTIAL/PLATELET
BASO%: 0.5 % (ref 0.0–2.0)
Basophils Absolute: 0.1 10*3/uL (ref 0.0–0.1)
EOS%: 1.7 % (ref 0.0–7.0)
Eosinophils Absolute: 0.2 10*3/uL (ref 0.0–0.5)
HEMATOCRIT: 36.7 % — AB (ref 38.4–49.9)
HEMOGLOBIN: 12.8 g/dL — AB (ref 13.0–17.1)
LYMPH#: 1.2 10*3/uL (ref 0.9–3.3)
LYMPH%: 11.5 % — ABNORMAL LOW (ref 14.0–49.0)
MCH: 32.6 pg (ref 27.2–33.4)
MCHC: 34.9 g/dL (ref 32.0–36.0)
MCV: 93.4 fL (ref 79.3–98.0)
MONO#: 0.9 10*3/uL (ref 0.1–0.9)
MONO%: 8.2 % (ref 0.0–14.0)
NEUT%: 78.1 % — ABNORMAL HIGH (ref 39.0–75.0)
NEUTROS ABS: 8.4 10*3/uL — AB (ref 1.5–6.5)
Platelets: 166 10*3/uL (ref 140–400)
RBC: 3.93 10*6/uL — ABNORMAL LOW (ref 4.20–5.82)
RDW: 14.9 % — AB (ref 11.0–14.6)
WBC: 10.7 10*3/uL — AB (ref 4.0–10.3)

## 2016-07-05 ENCOUNTER — Ambulatory Visit (HOSPITAL_COMMUNITY)
Admission: RE | Admit: 2016-07-05 | Discharge: 2016-07-05 | Disposition: A | Discharge status: Home / Self Care | Payer: Medicare Other | Source: Ambulatory Visit | Attending: Internal Medicine | Admitting: Internal Medicine

## 2016-07-05 DIAGNOSIS — Z5111 Encounter for antineoplastic chemotherapy: Secondary | ICD-10-CM

## 2016-07-05 DIAGNOSIS — C089 Malignant neoplasm of major salivary gland, unspecified: Secondary | ICD-10-CM | POA: Insufficient documentation

## 2016-07-05 DIAGNOSIS — Z9221 Personal history of antineoplastic chemotherapy: Secondary | ICD-10-CM | POA: Diagnosis not present

## 2016-07-05 DIAGNOSIS — C4492 Squamous cell carcinoma of skin, unspecified: Secondary | ICD-10-CM | POA: Diagnosis not present

## 2016-07-05 DIAGNOSIS — C7951 Secondary malignant neoplasm of bone: Secondary | ICD-10-CM | POA: Insufficient documentation

## 2016-07-05 MED ORDER — IOPAMIDOL (ISOVUE-300) INJECTION 61%
75.0000 mL | Freq: Once | INTRAVENOUS | Status: AC | PRN
  Administered 2016-07-05: 50 mL via INTRAVENOUS

## 2016-07-08 ENCOUNTER — Other Ambulatory Visit (HOSPITAL_BASED_OUTPATIENT_CLINIC_OR_DEPARTMENT_OTHER): Payer: Medicare Other

## 2016-07-08 ENCOUNTER — Encounter: Payer: Self-pay | Admitting: Internal Medicine

## 2016-07-08 ENCOUNTER — Ambulatory Visit (HOSPITAL_BASED_OUTPATIENT_CLINIC_OR_DEPARTMENT_OTHER): Payer: Medicare Other

## 2016-07-08 ENCOUNTER — Telehealth: Payer: Self-pay | Admitting: Internal Medicine

## 2016-07-08 ENCOUNTER — Ambulatory Visit (HOSPITAL_BASED_OUTPATIENT_CLINIC_OR_DEPARTMENT_OTHER): Payer: Medicare Other | Admitting: Internal Medicine

## 2016-07-08 VITALS — BP 141/93 | HR 80 | Temp 98.1°F | Resp 17 | Ht 72.0 in | Wt 183.6 lb

## 2016-07-08 DIAGNOSIS — C801 Malignant (primary) neoplasm, unspecified: Secondary | ICD-10-CM

## 2016-07-08 DIAGNOSIS — C770 Secondary and unspecified malignant neoplasm of lymph nodes of head, face and neck: Secondary | ICD-10-CM

## 2016-07-08 DIAGNOSIS — Z5111 Encounter for antineoplastic chemotherapy: Secondary | ICD-10-CM | POA: Diagnosis present

## 2016-07-08 DIAGNOSIS — C089 Malignant neoplasm of major salivary gland, unspecified: Secondary | ICD-10-CM

## 2016-07-08 DIAGNOSIS — D490 Neoplasm of unspecified behavior of digestive system: Secondary | ICD-10-CM

## 2016-07-08 DIAGNOSIS — C7951 Secondary malignant neoplasm of bone: Secondary | ICD-10-CM

## 2016-07-08 DIAGNOSIS — C799 Secondary malignant neoplasm of unspecified site: Secondary | ICD-10-CM

## 2016-07-08 LAB — CBC WITH DIFFERENTIAL/PLATELET
BASO%: 1.2 % (ref 0.0–2.0)
Basophils Absolute: 0.1 10*3/uL (ref 0.0–0.1)
EOS%: 0.7 % (ref 0.0–7.0)
Eosinophils Absolute: 0.1 10*3/uL (ref 0.0–0.5)
HEMATOCRIT: 37.8 % — AB (ref 38.4–49.9)
HEMOGLOBIN: 12.8 g/dL — AB (ref 13.0–17.1)
LYMPH#: 1 10*3/uL (ref 0.9–3.3)
LYMPH%: 13.6 % — ABNORMAL LOW (ref 14.0–49.0)
MCH: 32.4 pg (ref 27.2–33.4)
MCHC: 33.8 g/dL (ref 32.0–36.0)
MCV: 95.7 fL (ref 79.3–98.0)
MONO#: 0.7 10*3/uL (ref 0.1–0.9)
MONO%: 9.4 % (ref 0.0–14.0)
NEUT%: 75.1 % — ABNORMAL HIGH (ref 39.0–75.0)
NEUTROS ABS: 5.7 10*3/uL (ref 1.5–6.5)
Platelets: 213 10*3/uL (ref 140–400)
RBC: 3.95 10*6/uL — ABNORMAL LOW (ref 4.20–5.82)
RDW: 16.4 % — AB (ref 11.0–14.6)
WBC: 7.6 10*3/uL (ref 4.0–10.3)

## 2016-07-08 LAB — COMPREHENSIVE METABOLIC PANEL
ALBUMIN: 3.7 g/dL (ref 3.5–5.0)
ALK PHOS: 75 U/L (ref 40–150)
ALT: 21 U/L (ref 0–55)
AST: 18 U/L (ref 5–34)
Anion Gap: 9 mEq/L (ref 3–11)
BUN: 28 mg/dL — AB (ref 7.0–26.0)
CALCIUM: 9.5 mg/dL (ref 8.4–10.4)
CHLORIDE: 109 meq/L (ref 98–109)
CO2: 24 mEq/L (ref 22–29)
CREATININE: 1.5 mg/dL — AB (ref 0.7–1.3)
EGFR: 44 mL/min/{1.73_m2} — ABNORMAL LOW (ref >90)
Glucose: 107 mg/dl (ref 70–140)
Potassium: 4.4 mEq/L (ref 3.5–5.1)
Sodium: 141 mEq/L (ref 136–145)
TOTAL PROTEIN: 6.7 g/dL (ref 6.4–8.3)
Total Bilirubin: 0.45 mg/dL (ref 0.20–1.20)

## 2016-07-08 MED ORDER — FAMOTIDINE IN NACL 20-0.9 MG/50ML-% IV SOLN
INTRAVENOUS | Status: AC
  Filled 2016-07-08: qty 50

## 2016-07-08 MED ORDER — SODIUM CHLORIDE 0.9 % IV SOLN
360.0000 mg | Freq: Once | INTRAVENOUS | Status: DC
  Filled 2016-07-08: qty 36

## 2016-07-08 MED ORDER — PALONOSETRON HCL INJECTION 0.25 MG/5ML
INTRAVENOUS | Status: AC
  Filled 2016-07-08: qty 5

## 2016-07-08 MED ORDER — FAMOTIDINE IN NACL 20-0.9 MG/50ML-% IV SOLN
20.0000 mg | Freq: Once | INTRAVENOUS | Status: AC
Start: 2016-07-08 — End: 2016-07-08
  Administered 2016-07-08: 20 mg via INTRAVENOUS

## 2016-07-08 MED ORDER — PALONOSETRON HCL INJECTION 0.25 MG/5ML
0.2500 mg | Freq: Once | INTRAVENOUS | Status: AC
  Administered 2016-07-08: 0.25 mg via INTRAVENOUS

## 2016-07-08 MED ORDER — SODIUM CHLORIDE 0.9 % IV SOLN
360.0000 mg | Freq: Once | INTRAVENOUS | Status: AC
  Administered 2016-07-08: 360 mg via INTRAVENOUS
  Filled 2016-07-08: qty 36

## 2016-07-08 MED ORDER — DIPHENHYDRAMINE HCL 50 MG/ML IJ SOLN
INTRAMUSCULAR | Status: AC
  Filled 2016-07-08: qty 1

## 2016-07-08 MED ORDER — SODIUM CHLORIDE 0.9 % IV SOLN
20.0000 mg | Freq: Once | INTRAVENOUS | Status: AC
  Administered 2016-07-08: 20 mg via INTRAVENOUS
  Filled 2016-07-08: qty 2

## 2016-07-08 MED ORDER — SODIUM CHLORIDE 0.9 % IV SOLN
Freq: Once | INTRAVENOUS | Status: AC
  Administered 2016-07-08: 13:00:00 via INTRAVENOUS

## 2016-07-08 MED ORDER — DIPHENHYDRAMINE HCL 50 MG/ML IJ SOLN
50.0000 mg | Freq: Once | INTRAMUSCULAR | Status: AC
  Administered 2016-07-08: 50 mg via INTRAVENOUS

## 2016-07-08 MED ORDER — PACLITAXEL CHEMO INJECTION 300 MG/50ML
200.0000 mg/m2 | Freq: Once | INTRAVENOUS | Status: AC
  Administered 2016-07-08: 408 mg via INTRAVENOUS
  Filled 2016-07-08: qty 68

## 2016-07-08 NOTE — Progress Notes (Signed)
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 Pablo Ledger. This is expected to be completed on 05/07/2016. 2) systemic chemotherapy with carboplatin for AUC of 5 and paclitaxel 200 MG/M2 every 3 weeks. First dose 05/08/2016. Status post 3 cycles.  INTERVAL HISTORY: Jose Frank 81 y.o. male returns to the clinic today for follow-up visit accompanied by his wife, Haynes Dage and his niece. The patient is feeling fine today with no specific complaints except for aching pain in the lower extremities worse after the Neulasta injection. He tolerated the third cycle of his systemic chemotherapy with carboplatin and paclitaxel fairly well except for aching pain and fatigue after the Neulasta injection. He has no nausea or vomiting, no fever. The patient denied having any significant weight loss or night sweats. He had repeat CT scan of the neck and chest performed recently and he is here for evaluation and discussion of his scan results.  MEDICAL HISTORY: Past Medical History:  Diagnosis Date  . Cancer ([place]) 1977   tonsil  . Encounter for antineoplastic chemotherapy 05/27/2016  . History of echocardiogram    Echo 8/14:  EF 55-60%, Gr 1 DD  . Hx of cardiovascular stress test    a.  ETT-Myoview 8/14:  No scar or ischemia, EF 52%, normal study;  b. ETT-Myoview:  EF 55%, no ST changes, no ischemia; Normal   . Hyperlipidemia   . Hypertension   . IHD (ischemic heart disease)    Remote PCI to distal RCA and atherectomy of th LAD in 1996; S/P PCI to  LCX and RCA in 2010.  . Metastatic adenocarcinoma ([place]) 04/04/2016  . Renal insufficiency    on Diovan  . Submandibular lymphadenopathy 03/21/2016    ALLERGIES:  is allergic to demerol.  MEDICATIONS:  Current Outpatient Prescriptions  Medication Sig Dispense Refill  . amLODipine (NORVASC) 5 MG tablet Take 1 tablet by mouth  daily 90 tablet 3  . aspirin 81 MG tablet Take 81 mg by mouth daily.      . clopidogrel (PLAVIX) 75 MG tablet Take 1 tablet by mouth  daily 90 tablet 3  . diphenhydramine-acetaminophen (TYLENOL PM) 25-500 MG TABS tablet Take 1 tablet by mouth at bedtime as needed (sleep). Reported on 06/17/2016    . GLUCOSAMINE-CHONDROITIN PO Take 1 tablet by mouth 2 (two) times daily. Reported on 06/07/2016    . metoprolol (LOPRESSOR) 50 MG tablet Take 25-50 mg by mouth 2 (two) times daily. Reported on 05/07/2016    . Multiple Vitamin (MULTIVITAMIN) tablet Take 1 tablet by mouth daily.      Marland Kitchen MYRBETRIQ 50 MG TB24 tablet Take 50 mg by mouth daily.     Marland Kitchen oxyCODONE-acetaminophen (PERCOCET/ROXICET) 5-325 MG tablet Take 1 tablet by mouth every 6 (six) hours as needed for severe pain. 30 tablet 0  . prochlorperazine (COMPAZINE) 10 MG tablet Take 1 tablet (10 mg total) by mouth every 6 (six) hours as needed for nausea or vomiting. 30 tablet 0  . rosuvastatin (CRESTOR) 10 MG tablet Take 10 mg by mouth daily.    . tamsulosin (FLOMAX)  0.4 MG CAPS capsule Take 0.4 mg by mouth every evening.   11  . nitroGLYCERIN (NITROSTAT) 0.4 MG SL tablet Place 0.4 mg under the tongue every 5 (five) minutes as needed for chest pain (x 3 doses). Reported on 06/17/2016     No current facility-administered medications for this visit.     SURGICAL HISTORY:  Past Surgical History:  Procedure Laterality Date  . CARDIAC CATHETERIZATION  09/13/2009  . CARDIAC CATHETERIZATION  09/04/2009   EF 60%  . CARDIOVASCULAR STRESS TEST  12/08/2007   EF 57%  . PCI  08/2009   TO THE LCX AND RCA  . TONSILLECTOMY      REVIEW  OF SYSTEMS:  Constitutional: positive for fatigue Eyes: negative Ears, nose, mouth, throat, and face: negative Respiratory: negative Cardiovascular: negative Gastrointestinal: negative Genitourinary:negative Integument/breast: negative Hematologic/lymphatic: negative Musculoskeletal:positive for arthralgias Neurological: negative Behavioral/Psych: negative Endocrine: negative Allergic/Immunologic: negative   PHYSICAL EXAMINATION: General appearance: alert, cooperative and no distress Head: Normocephalic, without obvious abnormality, atraumatic Neck: no carotid bruit, no JVD, supple, symmetrical, trachea midline, thyroid not enlarged, symmetric, no tenderness/mass/nodules and large submandibular mass Lymph nodes: large submandibular mass Resp: clear to auscultation bilaterally Back: symmetric, no curvature. ROM normal. No CVA tenderness. Cardio: regular rate and rhythm, S1, S2 normal, no murmur, click, rub or gallop GI: soft, non-tender; bowel sounds normal; no masses,  no organomegaly Extremities: extremities normal, atraumatic, no cyanosis or edema Neurologic: Alert and oriented X 3, normal strength and tone. Normal symmetric reflexes. Normal coordination and gait  ECOG PERFORMANCE STATUS: 1 - Symptomatic but completely ambulatory  Blood pressure (!) 141/93, pulse 80, temperature 98.1 F (36.7 C), temperature source Oral, resp. rate 17, height 6' (1.829 m), weight 183 lb 9.6 oz (83.3 kg), SpO2 99 %.  LABORATORY DATA: Lab Results  Component Value Date   WBC 7.6 07/08/2016   HGB 12.8 (L) 07/08/2016   HCT 37.8 (L) 07/08/2016   MCV 95.7 07/08/2016   PLT 213 07/08/2016      Chemistry      Component Value Date/Time   NA 141 07/08/2016 1009   K 4.4 07/08/2016 1009   CL 106 04/02/2016 1120   CO2 24 07/08/2016 1009   BUN 28.0 (H) 07/08/2016 1009   CREATININE 1.5 (H) 07/08/2016 1009      Component Value Date/Time   CALCIUM 9.5 07/08/2016 1009   ALKPHOS 75 07/08/2016 1009     AST 18 07/08/2016 1009   ALT 21 07/08/2016 1009   BILITOT 0.45 07/08/2016 1009       RADIOGRAPHIC STUDIES: Ct Soft Tissue Neck W Contrast  Result Date: 07/05/2016 CLINICAL DATA:  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 Known metastatic disease. EXAM: CT NECK WITH CONTRAST TECHNIQUE: Multidetector CT imaging of the neck was performed using the standard protocol following the bolus administration of intravenous contrast. CONTRAST:  80mL ISOVUE-300 IOPAMIDOL (ISOVUE-300) INJECTION 61% COMPARISON:  PET CT 03/20/2016 FINDINGS: Pharynx and larynx: Right sided sublingual and submandibular space mass measuring 3.9 x 2.1 x 4.0 cm as shown on the recent PET-CT. No other mucosal or submucosal lesion. Salivary glands: Left submandibular gland is atrophic. Both parotid glands appear atrophic but without focal lesion. Thyroid: Normal Lymph nodes: No distant abnormal or low-density lymph nodes. Vascular: Carotid atherosclerosis.  No acute finding. Limited intracranial: Normal Visualized orbits: Not included. Mastoids and visualized paranasal sinuses: Clear Skeleton: Cervical spondylosis. IMPRESSION: 3.9 x 2.1 x 4.0 cm right-sided sublingual and submandibular space mass as demonstrated on the previous PET CT.  No evidence of other neck lymphadenopathy. Electronically Signed   By: Nelson Chimes M.D.   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 EXAM: CT CHEST WITH CONTRAST TECHNIQUE: Multidetector CT imaging of the chest was performed during intravenous contrast administration. CONTRAST:  88mL ISOVUE-300 IOPAMIDOL (ISOVUE-300) INJECTION 61% COMPARISON:  03/07/2016.  PET-CT from 03/20/2016. FINDINGS: Cardiovascular: The heart size is normal. No pericardial effusion. Coronary artery calcification is noted. Stable ascending thoracic aorta measuring 4.2 cm today compared to 4.3 cm diameter previously. Mediastinum/Nodes: No  mediastinal lymphadenopathy. There is no hilar lymphadenopathy. There is no axillary lymphadenopathy. The esophagus has normal imaging features. Lungs/Pleura: 2 mm right middle lobe pulmonary nodule (image 98 series 5) is minimally more prominent today, likely secondary to the smaller slice thickness. 6 mm nodule posterior right lower lobe is stable in seen on image 129 series 5 today. No evidence for pulmonary nodule or mass in the left lung. No focal airspace consolidation. No pulmonary edema or pleural effusion. Subsegmental atelectasis identified both lower lobes. Upper Abdomen: Unremarkable. Musculoskeletal: Expansile lytic lesion posterior left seventh rib again noted with evidence of pathologic fracture, as before. IMPRESSION: 1. Stable exam.  No new or progressive findings. 2. 2 mm right middle lobe nodule and 6 mm posterior right lower lobe nodule, unchanged. No new or progressive pulmonary lesions. 3. No substantial change in the expansile lytic posterior left seventh rib fracture with associated pathologic fracture. 4. Thoracic aortic atherosclerosis. Electronically Signed   By: Misty Stanley M.D.   On: 07/05/2016 15:54   ASSESSMENT AND PLAN: This is a very pleasant 81 years old white male recently diagnosed with metastatic adenocarcinoma questionable for mucoepidermoid carcinoma involving the submandibular gland as well as left seventh ribs bone lesion. Status post palliative radiotherapy to the left seventh rib lesion. The patient is currently undergoing systemic chemotherapy with carboplatin and paclitaxel is status post 3 cycles. He tolerated the first 3 cycles of his treatment well except for the fatigue and aching pain in the lower extremities partially secondary to Neulasta injection. He was advised to take Claritin and pain medication as needed. The recent CT scan of the neck and chest showed no evidence for disease progression. I discussed the scan results with the patient and his family. I  recommended for him to continue his current treatment with carboplatin and paclitaxel. I recommended for him to proceed with cycle #4 today as a scheduled. He would come back for follow-up visit in 3 weeks for evaluation before starting cycle #5.  He was advised to call immediately if he has any concerning symptoms in the interval. The patient voices understanding of current disease status and treatment options and is in agreement with the current care plan.  All questions were answered. The patient knows to call the clinic with any problems, questions or concerns. We can certainly see the patient much sooner if necessary.  Disclaimer: This note was dictated with voice recognition software. Similar sounding words can inadvertently be transcribed and may not be corrected upon review.

## 2016-07-08 NOTE — Telephone Encounter (Signed)
per of to sch pt appt-pt to get copy of avs after trmt

## 2016-07-10 ENCOUNTER — Ambulatory Visit (HOSPITAL_BASED_OUTPATIENT_CLINIC_OR_DEPARTMENT_OTHER): Payer: Medicare Other

## 2016-07-10 VITALS — BP 126/86 | HR 74 | Temp 97.7°F | Resp 18

## 2016-07-10 DIAGNOSIS — C7951 Secondary malignant neoplasm of bone: Secondary | ICD-10-CM | POA: Diagnosis not present

## 2016-07-10 DIAGNOSIS — C770 Secondary and unspecified malignant neoplasm of lymph nodes of head, face and neck: Secondary | ICD-10-CM | POA: Diagnosis not present

## 2016-07-10 DIAGNOSIS — C801 Malignant (primary) neoplasm, unspecified: Secondary | ICD-10-CM

## 2016-07-10 DIAGNOSIS — C089 Malignant neoplasm of major salivary gland, unspecified: Secondary | ICD-10-CM

## 2016-07-10 MED ORDER — PEGFILGRASTIM INJECTION 6 MG/0.6ML ~~LOC~~
6.0000 mg | PREFILLED_SYRINGE | Freq: Once | SUBCUTANEOUS | Status: AC
  Administered 2016-07-10: 6 mg via SUBCUTANEOUS
  Filled 2016-07-10: qty 0.6

## 2016-07-10 NOTE — Patient Instructions (Signed)
Pegfilgrastim injection What is this medicine? PEGFILGRASTIM (PEG fil gra stim) is a long-acting granulocyte colony-stimulating factor that stimulates the growth of neutrophils, a type of white blood cell important in the body's fight against infection. It is used to reduce the incidence of fever and infection in patients with certain types of cancer who are receiving chemotherapy that affects the bone marrow, and to increase survival after being exposed to high doses of radiation. This medicine may be used for other purposes; ask your health care provider or pharmacist if you have questions. What should I tell my health care provider before I take this medicine? They need to know if you have any of these conditions: -kidney disease -latex allergy -ongoing radiation therapy -sickle cell disease -skin reactions to acrylic adhesives (On-Body Injector only) -an unusual or allergic reaction to pegfilgrastim, filgrastim, other medicines, foods, dyes, or preservatives -pregnant or trying to get pregnant -breast-feeding How should I use this medicine? This medicine is for injection under the skin. If you get this medicine at home, you will be taught how to prepare and give the pre-filled syringe or how to use the On-body Injector. Refer to the patient Instructions for Use for detailed instructions. Use exactly as directed. Take your medicine at regular intervals. Do not take your medicine more often than directed. It is important that you put your used needles and syringes in a special sharps container. Do not put them in a trash can. If you do not have a sharps container, call your pharmacist or healthcare provider to get one. Talk to your pediatrician regarding the use of this medicine in children. While this drug may be prescribed for selected conditions, precautions do apply. Overdosage: If you think you have taken too much of this medicine contact a poison control center or emergency room at  once. NOTE: This medicine is only for you. Do not share this medicine with others. What if I miss a dose? It is important not to miss your dose. Call your doctor or health care professional if you miss your dose. If you miss a dose due to an On-body Injector failure or leakage, a new dose should be administered as soon as possible using a single prefilled syringe for manual use. What may interact with this medicine? Interactions have not been studied. Give your health care provider a list of all the medicines, herbs, non-prescription drugs, or dietary supplements you use. Also tell them if you smoke, drink alcohol, or use illegal drugs. Some items may interact with your medicine. This list may not describe all possible interactions. Give your health care provider a list of all the medicines, herbs, non-prescription drugs, or dietary supplements you use. Also tell them if you smoke, drink alcohol, or use illegal drugs. Some items may interact with your medicine. What should I watch for while using this medicine? You may need blood work done while you are taking this medicine. If you are going to need a MRI, CT scan, or other procedure, tell your doctor that you are using this medicine (On-Body Injector only). What side effects may I notice from receiving this medicine? Side effects that you should report to your doctor or health care professional as soon as possible: -allergic reactions like skin rash, itching or hives, swelling of the face, lips, or tongue -dizziness -fever -pain, redness, or irritation at site where injected -pinpoint red spots on the skin -red or dark-brown urine -shortness of breath or breathing problems -stomach or side pain, or pain   at the shoulder -swelling -tiredness -trouble passing urine or change in the amount of urine Side effects that usually do not require medical attention (report to your doctor or health care professional if they continue or are  bothersome): -bone pain -muscle pain This list may not describe all possible side effects. Call your doctor for medical advice about side effects. You may report side effects to FDA at 1-800-FDA-1088. Where should I keep my medicine? Keep out of the reach of children. Store pre-filled syringes in a refrigerator between 2 and 8 degrees C (36 and 46 degrees F). Do not freeze. Keep in carton to protect from light. Throw away this medicine if it is left out of the refrigerator for more than 48 hours. Throw away any unused medicine after the expiration date. NOTE: This sheet is a summary. It may not cover all possible information. If you have questions about this medicine, talk to your doctor, pharmacist, or health care provider.    2016, Elsevier/Gold Standard. (2014-12-15 14:30:14)  

## 2016-07-11 ENCOUNTER — Telehealth: Payer: Self-pay | Admitting: Medical Oncology

## 2016-07-11 NOTE — Telephone Encounter (Signed)
Wife calls to report pt had Neulasta yesterday and had a bad night with "pain in the groin " and urge to urinate but produces small amount of urine. He denies dysuria, flank pain, fever, chills. She stated he will not take hi pan med. When he sits on commode pain is better. Per Julien Nordmann I told wife the neulasta can cause bone pain including the pelvic area and he needs to take his pain med and increase fluids. I instructed her to take him to ED if symptoms continue to persist or worsen.She voiced understanding.

## 2016-07-12 DIAGNOSIS — R330 Drug induced retention of urine: Secondary | ICD-10-CM | POA: Diagnosis not present

## 2016-07-15 ENCOUNTER — Other Ambulatory Visit (HOSPITAL_BASED_OUTPATIENT_CLINIC_OR_DEPARTMENT_OTHER): Payer: Medicare Other

## 2016-07-15 DIAGNOSIS — C801 Malignant (primary) neoplasm, unspecified: Secondary | ICD-10-CM | POA: Diagnosis present

## 2016-07-15 DIAGNOSIS — C089 Malignant neoplasm of major salivary gland, unspecified: Secondary | ICD-10-CM

## 2016-07-15 DIAGNOSIS — D490 Neoplasm of unspecified behavior of digestive system: Secondary | ICD-10-CM

## 2016-07-15 DIAGNOSIS — R330 Drug induced retention of urine: Secondary | ICD-10-CM | POA: Diagnosis not present

## 2016-07-15 DIAGNOSIS — N401 Enlarged prostate with lower urinary tract symptoms: Secondary | ICD-10-CM | POA: Diagnosis not present

## 2016-07-15 LAB — COMPREHENSIVE METABOLIC PANEL
ALBUMIN: 3.8 g/dL (ref 3.5–5.0)
ALK PHOS: 75 U/L (ref 40–150)
ALT: 32 U/L (ref 0–55)
AST: 23 U/L (ref 5–34)
Anion Gap: 11 mEq/L (ref 3–11)
BILIRUBIN TOTAL: 0.8 mg/dL (ref 0.20–1.20)
BUN: 32.1 mg/dL — AB (ref 7.0–26.0)
CO2: 25 mEq/L (ref 22–29)
CREATININE: 1.6 mg/dL — AB (ref 0.7–1.3)
Calcium: 9.9 mg/dL (ref 8.4–10.4)
Chloride: 104 mEq/L (ref 98–109)
EGFR: 40 mL/min/{1.73_m2} — AB (ref >90)
GLUCOSE: 104 mg/dL (ref 70–140)
Potassium: 4.8 mEq/L (ref 3.5–5.1)
SODIUM: 140 meq/L (ref 136–145)
TOTAL PROTEIN: 7 g/dL (ref 6.4–8.3)

## 2016-07-15 LAB — CBC WITH DIFFERENTIAL/PLATELET
BASO%: 1.6 % (ref 0.0–2.0)
Basophils Absolute: 0 10*3/uL (ref 0.0–0.1)
EOS ABS: 0.1 10*3/uL (ref 0.0–0.5)
EOS%: 3.7 % (ref 0.0–7.0)
HCT: 34.2 % — ABNORMAL LOW (ref 38.4–49.9)
HEMOGLOBIN: 11.7 g/dL — AB (ref 13.0–17.1)
LYMPH%: 28.5 % (ref 14.0–49.0)
MCH: 32.5 pg (ref 27.2–33.4)
MCHC: 34.2 g/dL (ref 32.0–36.0)
MCV: 95 fL (ref 79.3–98.0)
MONO#: 0.4 10*3/uL (ref 0.1–0.9)
MONO%: 14.6 % — AB (ref 0.0–14.0)
NEUT%: 51.6 % (ref 39.0–75.0)
NEUTROS ABS: 1.3 10*3/uL — AB (ref 1.5–6.5)
Platelets: 143 10*3/uL (ref 140–400)
RBC: 3.6 10*6/uL — ABNORMAL LOW (ref 4.20–5.82)
RDW: 15 % — AB (ref 11.0–14.6)
WBC: 2.5 10*3/uL — AB (ref 4.0–10.3)
lymph#: 0.7 10*3/uL — ABNORMAL LOW (ref 0.9–3.3)

## 2016-07-17 DIAGNOSIS — R338 Other retention of urine: Secondary | ICD-10-CM | POA: Diagnosis not present

## 2016-07-17 DIAGNOSIS — N401 Enlarged prostate with lower urinary tract symptoms: Secondary | ICD-10-CM | POA: Diagnosis not present

## 2016-07-19 ENCOUNTER — Telehealth: Payer: Self-pay | Admitting: Medical Oncology

## 2016-07-19 NOTE — Telephone Encounter (Signed)
Wife left a message to return her call. I called and no answer.

## 2016-07-22 ENCOUNTER — Other Ambulatory Visit (HOSPITAL_BASED_OUTPATIENT_CLINIC_OR_DEPARTMENT_OTHER): Payer: Medicare Other

## 2016-07-22 DIAGNOSIS — C801 Malignant (primary) neoplasm, unspecified: Secondary | ICD-10-CM

## 2016-07-22 DIAGNOSIS — D490 Neoplasm of unspecified behavior of digestive system: Secondary | ICD-10-CM

## 2016-07-22 DIAGNOSIS — C089 Malignant neoplasm of major salivary gland, unspecified: Secondary | ICD-10-CM

## 2016-07-22 LAB — COMPREHENSIVE METABOLIC PANEL
ALBUMIN: 3.5 g/dL (ref 3.5–5.0)
ALK PHOS: 96 U/L (ref 40–150)
ALT: 19 U/L (ref 0–55)
AST: 18 U/L (ref 5–34)
Anion Gap: 9 mEq/L (ref 3–11)
BUN: 30.2 mg/dL — ABNORMAL HIGH (ref 7.0–26.0)
CO2: 25 mEq/L (ref 22–29)
Calcium: 9.9 mg/dL (ref 8.4–10.4)
Chloride: 107 mEq/L (ref 98–109)
Creatinine: 1.8 mg/dL — ABNORMAL HIGH (ref 0.7–1.3)
EGFR: 35 mL/min/{1.73_m2} — AB (ref >90)
GLUCOSE: 105 mg/dL (ref 70–140)
POTASSIUM: 4.6 meq/L (ref 3.5–5.1)
SODIUM: 142 meq/L (ref 136–145)
Total Bilirubin: 0.46 mg/dL (ref 0.20–1.20)
Total Protein: 7.3 g/dL (ref 6.4–8.3)

## 2016-07-22 LAB — CBC WITH DIFFERENTIAL/PLATELET
BASO%: 0.5 % (ref 0.0–2.0)
BASOS ABS: 0.1 10*3/uL (ref 0.0–0.1)
EOS ABS: 0.1 10*3/uL (ref 0.0–0.5)
EOS%: 1 % (ref 0.0–7.0)
HCT: 37 % — ABNORMAL LOW (ref 38.4–49.9)
HEMOGLOBIN: 12.3 g/dL — AB (ref 13.0–17.1)
LYMPH%: 10.3 % — AB (ref 14.0–49.0)
MCH: 32.4 pg (ref 27.2–33.4)
MCHC: 33.3 g/dL (ref 32.0–36.0)
MCV: 97.2 fL (ref 79.3–98.0)
MONO#: 0.8 10*3/uL (ref 0.1–0.9)
MONO%: 5.9 % (ref 0.0–14.0)
NEUT#: 10.8 10*3/uL — ABNORMAL HIGH (ref 1.5–6.5)
NEUT%: 82.3 % — AB (ref 39.0–75.0)
Platelets: 224 10*3/uL (ref 140–400)
RBC: 3.81 10*6/uL — ABNORMAL LOW (ref 4.20–5.82)
RDW: 16.5 % — ABNORMAL HIGH (ref 11.0–14.6)
WBC: 13.1 10*3/uL — ABNORMAL HIGH (ref 4.0–10.3)
lymph#: 1.4 10*3/uL (ref 0.9–3.3)

## 2016-07-24 DIAGNOSIS — R330 Drug induced retention of urine: Secondary | ICD-10-CM | POA: Diagnosis not present

## 2016-07-29 ENCOUNTER — Telehealth: Payer: Self-pay | Admitting: Internal Medicine

## 2016-07-29 ENCOUNTER — Other Ambulatory Visit (HOSPITAL_BASED_OUTPATIENT_CLINIC_OR_DEPARTMENT_OTHER): Payer: Medicare Other

## 2016-07-29 ENCOUNTER — Encounter: Payer: Self-pay | Admitting: Internal Medicine

## 2016-07-29 ENCOUNTER — Ambulatory Visit: Payer: Medicare Other

## 2016-07-29 ENCOUNTER — Ambulatory Visit (HOSPITAL_BASED_OUTPATIENT_CLINIC_OR_DEPARTMENT_OTHER): Payer: Medicare Other | Admitting: Internal Medicine

## 2016-07-29 VITALS — BP 131/77 | HR 90 | Temp 98.4°F | Resp 18 | Ht 72.0 in | Wt 172.4 lb

## 2016-07-29 DIAGNOSIS — Z5111 Encounter for antineoplastic chemotherapy: Secondary | ICD-10-CM

## 2016-07-29 DIAGNOSIS — C089 Malignant neoplasm of major salivary gland, unspecified: Secondary | ICD-10-CM

## 2016-07-29 DIAGNOSIS — C801 Malignant (primary) neoplasm, unspecified: Secondary | ICD-10-CM | POA: Diagnosis present

## 2016-07-29 DIAGNOSIS — C7951 Secondary malignant neoplasm of bone: Secondary | ICD-10-CM

## 2016-07-29 DIAGNOSIS — D490 Neoplasm of unspecified behavior of digestive system: Secondary | ICD-10-CM

## 2016-07-29 DIAGNOSIS — C799 Secondary malignant neoplasm of unspecified site: Secondary | ICD-10-CM

## 2016-07-29 LAB — CBC WITH DIFFERENTIAL/PLATELET
BASO%: 1.1 % (ref 0.0–2.0)
Basophils Absolute: 0.1 10*3/uL (ref 0.0–0.1)
EOS%: 0.3 % (ref 0.0–7.0)
Eosinophils Absolute: 0 10*3/uL (ref 0.0–0.5)
HEMATOCRIT: 36.7 % — AB (ref 38.4–49.9)
HEMOGLOBIN: 12.3 g/dL — AB (ref 13.0–17.1)
LYMPH#: 1.1 10*3/uL (ref 0.9–3.3)
LYMPH%: 14.3 % (ref 14.0–49.0)
MCH: 32.6 pg (ref 27.2–33.4)
MCHC: 33.5 g/dL (ref 32.0–36.0)
MCV: 97.3 fL (ref 79.3–98.0)
MONO#: 0.6 10*3/uL (ref 0.1–0.9)
MONO%: 7.5 % (ref 0.0–14.0)
NEUT%: 76.8 % — ABNORMAL HIGH (ref 39.0–75.0)
NEUTROS ABS: 6.1 10*3/uL (ref 1.5–6.5)
Platelets: 295 10*3/uL (ref 140–400)
RBC: 3.77 10*6/uL — ABNORMAL LOW (ref 4.20–5.82)
RDW: 15.8 % — AB (ref 11.0–14.6)
WBC: 7.9 10*3/uL (ref 4.0–10.3)

## 2016-07-29 LAB — COMPREHENSIVE METABOLIC PANEL
ALBUMIN: 3.6 g/dL (ref 3.5–5.0)
ALK PHOS: 78 U/L (ref 40–150)
ALT: 16 U/L (ref 0–55)
AST: 16 U/L (ref 5–34)
Anion Gap: 9 mEq/L (ref 3–11)
BUN: 29.3 mg/dL — AB (ref 7.0–26.0)
CO2: 25 mEq/L (ref 22–29)
CREATININE: 1.7 mg/dL — AB (ref 0.7–1.3)
Calcium: 10.1 mg/dL (ref 8.4–10.4)
Chloride: 109 mEq/L (ref 98–109)
EGFR: 36 mL/min/{1.73_m2} — ABNORMAL LOW (ref >90)
GLUCOSE: 106 mg/dL (ref 70–140)
POTASSIUM: 4.4 meq/L (ref 3.5–5.1)
SODIUM: 143 meq/L (ref 136–145)
TOTAL PROTEIN: 7.1 g/dL (ref 6.4–8.3)
Total Bilirubin: 0.55 mg/dL (ref 0.20–1.20)

## 2016-07-29 NOTE — Telephone Encounter (Signed)
Gave patient avs report and appointments for November. Central radiology scheduling will call re scans - patient aware. Per 8/21 los all lab/chemo cxd until next visit - 3 mos per los.

## 2016-07-29 NOTE — Progress Notes (Signed)
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 Pablo Ledger. This is expected to be completed on 05/07/2016. 2) systemic chemotherapy with carboplatin for AUC of 5 and paclitaxel 200 MG/M2 every 3 weeks. First dose 05/08/2016. Status post 4 cycles.  INTERVAL HISTORY: Jose Frank 81 y.o. male returns to the clinic today for follow-up visit accompanied by his wife, Jose Frank. The patient tolerated the last cycle of his treatment well but unfortunately he has urinary retention 3 days after his treatment. He was seen by his urologist Dr. Karsten Ro and was found to have benign prostatic hypertrophy requiring catheterization. He is currently doing self-catheterization. He continues to have increasing fatigue and weakness. He has no nausea or vomiting, no fever. The patient denied having any significant weight loss or night sweats. He is here today for evaluation before starting cycle #5.   MEDICAL HISTORY: Past Medical History:  Diagnosis Date  . Cancer ([place]) 1977   tonsil  . Encounter for antineoplastic chemotherapy 05/27/2016  . History of echocardiogram    Echo 8/14:  EF 55-60%, Gr 1 DD  . Hx of cardiovascular stress test    a.  ETT-Myoview 8/14:  No scar or ischemia, EF 52%, normal study;  b. ETT-Myoview:  EF 55%, no ST changes, no ischemia; Normal   . Hyperlipidemia   . Hypertension   . IHD (ischemic heart disease)    Remote PCI to distal RCA and atherectomy of th LAD in 1996; S/P PCI to LCX and RCA in 2010.  . Metastatic  adenocarcinoma ([place]) 04/04/2016  . Renal insufficiency    on Diovan  . Submandibular lymphadenopathy 03/21/2016    ALLERGIES:  is allergic to demerol.  MEDICATIONS:  Current Outpatient Prescriptions  Medication Sig Dispense Refill  . amLODipine (NORVASC) 5 MG tablet Take 1 tablet by mouth  daily 90 tablet 3  . aspirin 81 MG tablet Take 81 mg by mouth daily.      . clopidogrel (PLAVIX) 75 MG tablet Take 1 tablet by mouth  daily 90 tablet 3  . GLUCOSAMINE-CHONDROITIN PO Take 1 tablet by mouth 2 (two) times daily. Reported on 06/07/2016    . metoprolol (LOPRESSOR) 50 MG tablet Take 25-50 mg by mouth 2 (two) times daily. Reported on 05/07/2016    . Multiple Vitamin (MULTIVITAMIN) tablet Take 1 tablet by mouth daily.      . prochlorperazine (COMPAZINE) 10 MG tablet Take 1 tablet (10 mg total) by mouth every 6 (six) hours as needed for nausea or vomiting. 30 tablet 0  . rosuvastatin (CRESTOR) 10 MG tablet Take 10 mg by mouth daily.    . tamsulosin (FLOMAX) 0.4 MG CAPS capsule Take 0.4 mg by mouth every evening.   11  . diphenhydramine-acetaminophen (TYLENOL PM) 25-500 MG TABS tablet Take 1 tablet by mouth at bedtime as needed (sleep). Reported on 06/17/2016    . nitroGLYCERIN (NITROSTAT) 0.4 MG SL tablet Place 0.4 mg under the tongue every 5 (five) minutes as needed for chest pain (x 3 doses). Reported on 06/17/2016    . oxyCODONE-acetaminophen (PERCOCET/ROXICET) 5-325 MG tablet  Take 1 tablet by mouth every 6 (six) hours as needed for severe pain. (Patient not taking: Reported on 07/29/2016) 30 tablet 0   No current facility-administered medications for this visit.     SURGICAL HISTORY:  Past Surgical History:  Procedure Laterality Date  . CARDIAC CATHETERIZATION  09/13/2009  . CARDIAC CATHETERIZATION  09/04/2009   EF 60%  . CARDIOVASCULAR STRESS TEST  12/08/2007   EF 57%  . PCI  08/2009   TO THE LCX AND RCA  . TONSILLECTOMY      REVIEW OF SYSTEMS:  Constitutional: positive for anorexia,  fatigue and weight loss Eyes: negative Ears, nose, mouth, throat, and face: negative Respiratory: negative Cardiovascular: negative Gastrointestinal: negative Genitourinary:negative Integument/breast: negative Hematologic/lymphatic: negative Musculoskeletal:positive for arthralgias and myalgias Neurological: negative Behavioral/Psych: negative Endocrine: negative Allergic/Immunologic: negative   PHYSICAL EXAMINATION: General appearance: alert, cooperative and no distress Head: Normocephalic, without obvious abnormality, atraumatic Neck: no carotid bruit, no JVD, supple, symmetrical, trachea midline, thyroid not enlarged, symmetric, no tenderness/mass/nodules and large submandibular mass Lymph nodes: large submandibular mass Resp: clear to auscultation bilaterally Back: symmetric, no curvature. ROM normal. No CVA tenderness. Cardio: regular rate and rhythm, S1, S2 normal, no murmur, click, rub or gallop GI: soft, non-tender; bowel sounds normal; no masses,  no organomegaly Extremities: extremities normal, atraumatic, no cyanosis or edema Neurologic: Alert and oriented X 3, normal strength and tone. Normal symmetric reflexes. Normal coordination and gait  ECOG PERFORMANCE STATUS: 1 - Symptomatic but completely ambulatory  Blood pressure 131/77, pulse 90, temperature 98.4 F (36.9 C), temperature source Oral, resp. rate 18, height 6' (1.829 m), weight 172 lb 6.4 oz (78.2 kg), SpO2 98 %.  LABORATORY DATA: Lab Results  Component Value Date   WBC 7.9 07/29/2016   HGB 12.3 (L) 07/29/2016   HCT 36.7 (L) 07/29/2016   MCV 97.3 07/29/2016   PLT 295 07/29/2016      Chemistry      Component Value Date/Time   NA 142 07/22/2016 1115   K 4.6 07/22/2016 1115   CL 106 04/02/2016 1120   CO2 25 07/22/2016 1115   BUN 30.2 (H) 07/22/2016 1115   CREATININE 1.8 (H) 07/22/2016 1115      Component Value Date/Time   CALCIUM 9.9 07/22/2016 1115   ALKPHOS 96 07/22/2016 1115   AST 18  07/22/2016 1115   ALT 19 07/22/2016 1115   BILITOT 0.46 07/22/2016 1115       RADIOGRAPHIC STUDIES: Ct Soft Tissue Neck W Contrast  Result Date: 07/05/2016 CLINICAL DATA:  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 Known metastatic disease. EXAM: CT NECK WITH CONTRAST TECHNIQUE: Multidetector CT imaging of the neck was performed using the standard protocol following the bolus administration of intravenous contrast. CONTRAST:  18mL ISOVUE-300 IOPAMIDOL (ISOVUE-300) INJECTION 61% COMPARISON:  PET CT 03/20/2016 FINDINGS: Pharynx and larynx: Right sided sublingual and submandibular space mass measuring 3.9 x 2.1 x 4.0 cm as shown on the recent PET-CT. No other mucosal or submucosal lesion. Salivary glands: Left submandibular gland is atrophic. Both parotid glands appear atrophic but without focal lesion. Thyroid: Normal Lymph nodes: No distant abnormal or low-density lymph nodes. Vascular: Carotid atherosclerosis.  No acute finding. Limited intracranial: Normal Visualized orbits: Not included. Mastoids and visualized paranasal sinuses: Clear Skeleton: Cervical spondylosis. IMPRESSION: 3.9 x 2.1 x 4.0 cm right-sided sublingual and submandibular space mass as demonstrated on the previous PET CT. No evidence of other neck lymphadenopathy. Electronically Signed   By: Nelson Chimes M.D.   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 EXAM: CT CHEST WITH CONTRAST TECHNIQUE: Multidetector CT imaging of the chest was performed during intravenous contrast administration. CONTRAST:  59mL ISOVUE-300 IOPAMIDOL (ISOVUE-300) INJECTION 61% COMPARISON:  03/07/2016.  PET-CT from 03/20/2016. FINDINGS: Cardiovascular: The heart size is normal. No pericardial effusion. Coronary artery calcification is noted. Stable ascending thoracic aorta measuring 4.2 cm today compared to 4.3 cm diameter previously. Mediastinum/Nodes: No mediastinal  lymphadenopathy. There is no hilar lymphadenopathy. There is no axillary lymphadenopathy. The esophagus has normal imaging features. Lungs/Pleura: 2 mm right middle lobe pulmonary nodule (image 98 series 5) is minimally more prominent today, likely secondary to the smaller slice thickness. 6 mm nodule posterior right lower lobe is stable in seen on image 129 series 5 today. No evidence for pulmonary nodule or mass in the left lung. No focal airspace consolidation. No pulmonary edema or pleural effusion. Subsegmental atelectasis identified both lower lobes. Upper Abdomen: Unremarkable. Musculoskeletal: Expansile lytic lesion posterior left seventh rib again noted with evidence of pathologic fracture, as before. IMPRESSION: 1. Stable exam.  No new or progressive findings. 2. 2 mm right middle lobe nodule and 6 mm posterior right lower lobe nodule, unchanged. No new or progressive pulmonary lesions. 3. No substantial change in the expansile lytic posterior left seventh rib fracture with associated pathologic fracture. 4. Thoracic aortic atherosclerosis. Electronically Signed   By: Misty Stanley M.D.   On: 07/05/2016 15:54   ASSESSMENT AND PLAN: This is a very pleasant 81 years old white male recently diagnosed with metastatic adenocarcinoma questionable for mucoepidermoid carcinoma involving the submandibular gland as well as left seventh ribs bone lesion. Status post palliative radiotherapy to the left seventh rib lesion. The patient is currently undergoing systemic chemotherapy with carboplatin and paclitaxel status post 3 cycles. He tolerated the first 3 cycles of his treatment well except for the fatigue and aching pain in the lower extremities partially secondary to Neulasta injection. He was advised to take Claritin and pain medication as needed. The patient has rough time with the last cycle of his systemic chemotherapy with urinary retention as well as increasing fatigue and weakness in addition to the  aching pain after the Neulasta injection. I had a lengthy discussion with the patient his wife about his condition and further treatment options. I recommended for him to discontinue his current systemic chemotherapy with carboplatin and paclitaxel at this point secondary to intolerance. I will continue the patient with observation and repeat CT scan of the chest and neck in 3 months. For the urinary retention, he will continue his routine evaluation by his urologist. He was advised to call immediately if he has any concerning symptoms in the interval. The patient voices understanding of current disease status and treatment options and is in agreement with the current care plan.  All questions were answered. The patient knows to call the clinic with any problems, questions or concerns. We can certainly see the patient much sooner if necessary.  Disclaimer: This note was dictated with voice recognition software. Similar sounding words can inadvertently be transcribed and may not be corrected upon review.

## 2016-07-31 ENCOUNTER — Ambulatory Visit: Payer: Medicare Other

## 2016-08-05 ENCOUNTER — Other Ambulatory Visit: Payer: Medicare Other

## 2016-08-13 ENCOUNTER — Other Ambulatory Visit: Payer: Medicare Other

## 2016-08-19 ENCOUNTER — Ambulatory Visit: Payer: Medicare Other

## 2016-08-19 ENCOUNTER — Ambulatory Visit: Payer: Medicare Other | Admitting: Internal Medicine

## 2016-08-19 ENCOUNTER — Other Ambulatory Visit: Payer: Medicare Other

## 2016-08-21 ENCOUNTER — Ambulatory Visit: Payer: Medicare Other

## 2016-08-26 ENCOUNTER — Other Ambulatory Visit: Payer: Medicare Other

## 2016-08-28 ENCOUNTER — Emergency Department (HOSPITAL_COMMUNITY): Payer: Medicare Other

## 2016-08-28 ENCOUNTER — Emergency Department (HOSPITAL_COMMUNITY)
Admission: EM | Admit: 2016-08-28 | Discharge: 2016-08-28 | Disposition: A | Discharge status: Home / Self Care | Payer: Medicare Other | Attending: Emergency Medicine | Admitting: Emergency Medicine

## 2016-08-28 ENCOUNTER — Encounter (HOSPITAL_COMMUNITY): Payer: Self-pay | Admitting: Emergency Medicine

## 2016-08-28 DIAGNOSIS — Y999 Unspecified external cause status: Secondary | ICD-10-CM | POA: Diagnosis not present

## 2016-08-28 DIAGNOSIS — Z79899 Other long term (current) drug therapy: Secondary | ICD-10-CM | POA: Insufficient documentation

## 2016-08-28 DIAGNOSIS — N390 Urinary tract infection, site not specified: Secondary | ICD-10-CM | POA: Diagnosis not present

## 2016-08-28 DIAGNOSIS — Y929 Unspecified place or not applicable: Secondary | ICD-10-CM | POA: Diagnosis not present

## 2016-08-28 DIAGNOSIS — S199XXA Unspecified injury of neck, initial encounter: Secondary | ICD-10-CM | POA: Diagnosis not present

## 2016-08-28 DIAGNOSIS — S01111A Laceration without foreign body of right eyelid and periocular area, initial encounter: Secondary | ICD-10-CM | POA: Diagnosis not present

## 2016-08-28 DIAGNOSIS — S0990XA Unspecified injury of head, initial encounter: Secondary | ICD-10-CM | POA: Diagnosis not present

## 2016-08-28 DIAGNOSIS — Y939 Activity, unspecified: Secondary | ICD-10-CM | POA: Insufficient documentation

## 2016-08-28 DIAGNOSIS — Z85818 Personal history of malignant neoplasm of other sites of lip, oral cavity, and pharynx: Secondary | ICD-10-CM | POA: Insufficient documentation

## 2016-08-28 DIAGNOSIS — I10 Essential (primary) hypertension: Secondary | ICD-10-CM | POA: Diagnosis not present

## 2016-08-28 DIAGNOSIS — Z7982 Long term (current) use of aspirin: Secondary | ICD-10-CM | POA: Diagnosis not present

## 2016-08-28 DIAGNOSIS — Z23 Encounter for immunization: Secondary | ICD-10-CM | POA: Insufficient documentation

## 2016-08-28 DIAGNOSIS — I2510 Atherosclerotic heart disease of native coronary artery without angina pectoris: Secondary | ICD-10-CM | POA: Diagnosis not present

## 2016-08-28 DIAGNOSIS — R51 Headache: Secondary | ICD-10-CM | POA: Diagnosis not present

## 2016-08-28 DIAGNOSIS — W1839XA Other fall on same level, initial encounter: Secondary | ICD-10-CM | POA: Insufficient documentation

## 2016-08-28 DIAGNOSIS — S0591XA Unspecified injury of right eye and orbit, initial encounter: Secondary | ICD-10-CM | POA: Diagnosis present

## 2016-08-28 DIAGNOSIS — W19XXXA Unspecified fall, initial encounter: Secondary | ICD-10-CM

## 2016-08-28 LAB — BASIC METABOLIC PANEL
Anion gap: 9 (ref 5–15)
BUN: 28 mg/dL — AB (ref 6–20)
CHLORIDE: 106 mmol/L (ref 101–111)
CO2: 25 mmol/L (ref 22–32)
CREATININE: 1.41 mg/dL — AB (ref 0.61–1.24)
Calcium: 9.9 mg/dL (ref 8.9–10.3)
GFR calc non Af Amer: 46 mL/min — ABNORMAL LOW (ref >60)
GFR, EST AFRICAN AMERICAN: 53 mL/min — AB (ref >60)
Glucose, Bld: 110 mg/dL — ABNORMAL HIGH (ref 65–99)
Potassium: 4.2 mmol/L (ref 3.5–5.1)
SODIUM: 140 mmol/L (ref 135–145)

## 2016-08-28 LAB — CBC
HEMATOCRIT: 37 % — AB (ref 39.0–52.0)
Hemoglobin: 13 g/dL (ref 13.0–17.0)
MCH: 33.2 pg (ref 26.0–34.0)
MCHC: 35.1 g/dL (ref 30.0–36.0)
MCV: 94.6 fL (ref 78.0–100.0)
PLATELETS: 209 10*3/uL (ref 150–400)
RBC: 3.91 MIL/uL — AB (ref 4.22–5.81)
RDW: 12.6 % (ref 11.5–15.5)
WBC: 8 10*3/uL (ref 4.0–10.5)

## 2016-08-28 LAB — URINE MICROSCOPIC-ADD ON

## 2016-08-28 LAB — URINALYSIS, ROUTINE W REFLEX MICROSCOPIC
Bilirubin Urine: NEGATIVE
GLUCOSE, UA: NEGATIVE mg/dL
Ketones, ur: 15 mg/dL — AB
Nitrite: NEGATIVE
PH: 7 (ref 5.0–8.0)
PROTEIN: 30 mg/dL — AB
Specific Gravity, Urine: 1.021 (ref 1.005–1.030)

## 2016-08-28 LAB — CBG MONITORING, ED: Glucose-Capillary: 99 mg/dL (ref 65–99)

## 2016-08-28 MED ORDER — LIDOCAINE-EPINEPHRINE (PF) 1 %-1:200000 IJ SOLN
10.0000 mL | Freq: Once | INTRAMUSCULAR | Status: AC
  Administered 2016-08-28: 10 mL via INTRADERMAL
  Filled 2016-08-28: qty 30

## 2016-08-28 MED ORDER — TETANUS-DIPHTH-ACELL PERTUSSIS 5-2.5-18.5 LF-MCG/0.5 IM SUSP
0.5000 mL | Freq: Once | INTRAMUSCULAR | Status: AC
  Administered 2016-08-28: 0.5 mL via INTRAMUSCULAR
  Filled 2016-08-28: qty 0.5

## 2016-08-28 MED ORDER — BACITRACIN ZINC 500 UNIT/GM EX OINT
TOPICAL_OINTMENT | CUTANEOUS | Status: AC
  Administered 2016-08-28: 17:00:00
  Filled 2016-08-28: qty 0.9

## 2016-08-28 MED ORDER — FOSFOMYCIN TROMETHAMINE 3 G PO PACK
3.0000 g | PACK | Freq: Once | ORAL | Status: AC
  Administered 2016-08-28: 3 g via ORAL
  Filled 2016-08-28: qty 3

## 2016-08-28 MED ORDER — LIDOCAINE-EPINEPHRINE 1 %-1:100000 IJ SOLN
10.0000 mL | Freq: Once | INTRAMUSCULAR | Status: DC
  Filled 2016-08-28: qty 10

## 2016-08-28 NOTE — ED Triage Notes (Signed)
Patient reports starting chemo for adenocarcinoma in May. Patient has gotten progressively off-balance since then. Patient has been off chemo for weeks. This morning, patient states he "got dizzy in the bathroom and went down." Patient hit his head on the bathroom sink. Laceration noted to forehead. Patient takes plavix, however, he did not take it this morning. Patient is alert, oriented x4, and ambulatory to triage room.

## 2016-08-28 NOTE — ED Notes (Signed)
Patient transported to CT 

## 2016-08-28 NOTE — ED Notes (Signed)
MD at bedside. 

## 2016-08-28 NOTE — ED Notes (Signed)
PT AWARE OF DELAY. EDP WILL SEE PT AND FAMILY AND DISCUSS DISCHARGE

## 2016-08-28 NOTE — Progress Notes (Signed)
CSW spoke with patient's wife, Jose Frank at bedside, as she states patient does not have in his hearing aid. She reports patient fell this morning around 630am as he was going to the bathroom. She reports as a result of patient's chemotherapy, he at times becomes dizzy and his blood pressure deviates. She reports patient's chemotherapy was stopped three weeks ago. She reports a catheter was put in three weeks ago because he began have issues with incontinence. She reports patient will see the urologist next week. She reports patient completes his own ADL's and he uses a cane. No questions noted for CSW during this visit.   Genice Rouge [id number] ED CSW 08/28/2016 12:38 PM

## 2016-08-28 NOTE — ED Provider Notes (Signed)
[facility] DEPT Provider Note   CSN: [id number] Arrival date & time: 08/28/16  1109    History   Chief Complaint Chief Complaint  Patient presents with  . Fall  . Laceration  . Dizziness    HPI Jose Frank is a 81 y.o. male with a past medical history significant for adenocarcinoma currently on chemotherapy, hypertension, hyperlipidemia, and coronary artery disease who presents for a fall. Patient is covered by wife reports that for the last several months, patient has felt gradual unsteadiness on his feet. He thinks this is secondary to his chemotherapy regimen. He reports that today, all the bathroom, he felt unsteady and fell forward hitting his head on the sink. Patient denied loss of consciousness but sustained a laceration over his right eyebrow. Patient reports mild headache but denies any vision changes, nausea, vomiting, or any neurologic deficits. Reports that he has had a history of urinary retention however, he has not needed a catheter over the last week. He denies any change in urine color or smell. Otherwise denies any abdominal pain, extremity pain, can't space, diarrhea, or dysuria. Patient is unsure of his last tetanus shot  The history is provided by the patient, medical records and the spouse. No language interpreter was used.  Fall  This is a new problem. The current episode started 1 to 2 hours ago. The problem has been resolved. Associated symptoms include headaches. Pertinent negatives include no chest pain, no abdominal pain and no shortness of breath. Nothing aggravates the symptoms. Nothing relieves the symptoms. He has tried nothing for the symptoms.    Past Medical History:  Diagnosis Date  . 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 Encounter for antineoplastic chemotherapy 05/27/2016  . History of echocardiogram    Echo 8/14:  EF 55-60%, Gr 1 DD  . Hx of cardiovascular stress test    a.  ETT-Myoview 8/14:  No scar or ischemia, EF 52%, normal study;  b.  ETT-Myoview:  EF 55%, no ST changes, no ischemia; Normal   . Hyperlipidemia   . Hypertension   . IHD (ischemic heart disease)    Remote PCI to distal RCA and atherectomy of th LAD in 1996; S/P PCI to LCX and RCA in 2010.  . Metastatic adenocarcinoma ([place]) 04/04/2016  . Renal insufficiency    on [place]  . Submandibular lymphadenopathy 03/21/2016    Patient Active Problem List   Diagnosis Date Noted  . Bone metastases ([place]) 06/08/2016  . Encounter for antineoplastic chemotherapy 05/27/2016  . 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 Metastatic adenocarcinoma ([place]) 04/04/2016  . Submandibular lymphadenopathy 03/21/2016  . Lytic lesion of bone on x-ray 03/21/2016  . Lung mass 03/13/2016  . Lipoma of buttock 12/16/2011  . CAD (coronary artery disease) 07/29/2011  . Hyperlipidemia 07/29/2011  . CRI (chronic renal insufficiency) 07/29/2011  . HTN (hypertension) 07/29/2011    Past Surgical History:  Procedure Laterality Date  . CARDIAC CATHETERIZATION  09/13/2009  . CARDIAC CATHETERIZATION  09/04/2009   EF 60%  . CARDIOVASCULAR STRESS TEST  12/08/2007   EF 57%  . PCI  08/2009   TO THE LCX AND RCA  . TONSILLECTOMY         Home Medications    Prior to Admission medications   Medication Sig Start Date End Date Taking? Authorizing Provider  amLODipine (NORVASC) 5 MG tablet Take 1 tablet by mouth  daily 06/28/16   Liliane Shi, PA-C  aspirin 81 MG tablet Take 81 mg by mouth daily.      Historical Provider, MD  clopidogrel (PLAVIX) 75 MG tablet Take 1 tablet by mouth  daily 06/28/16   Liliane Shi, PA-C  diphenhydramine-acetaminophen (TYLENOL PM) 25-500 MG TABS tablet Take 1 tablet by mouth at bedtime as needed (sleep). Reported on 06/17/2016    Historical Provider, MD  GLUCOSAMINE-CHONDROITIN PO Take 1 tablet by mouth 2 (two) times daily. Reported on 06/07/2016    Historical Provider, MD  metoprolol (LOPRESSOR) 50 MG  tablet Take 25-50 mg by mouth 2 (two) times daily. Reported on 05/07/2016    Historical Provider, MD  Multiple Vitamin (MULTIVITAMIN) tablet Take 1 tablet by mouth daily.      Historical Provider, MD  nitroGLYCERIN (NITROSTAT) 0.4 MG SL tablet Place 0.4 mg under the tongue every 5 (five) minutes as needed for chest pain (x 3 doses). Reported on 06/17/2016    Historical Provider, MD  oxyCODONE-acetaminophen (PERCOCET/ROXICET) 5-325 MG tablet Take 1 tablet by mouth every 6 (six) hours as needed for severe pain. Patient not taking: Reported on 07/29/2016 06/10/16   Curt Bears, MD  prochlorperazine (COMPAZINE) 10 MG tablet Take 1 tablet (10 mg total) by mouth every 6 (six) hours as needed for nausea or vomiting. 04/11/16   Curt Bears, MD  rosuvastatin (CRESTOR) 10 MG tablet Take 10 mg by mouth daily. 03/18/16   Leighton Ruff, MD  tamsulosin [facility]) 0.4 MG CAPS capsule Take 0.4 mg by mouth every evening.  01/31/15   Historical Provider, MD    Family History Family History  Problem Relation Age of Onset  . Stroke Mother   . Heart disease Father     Social History Social History  Substance Use Topics  . Smoking status: Never Smoker  . Smokeless tobacco: Never Used  . Alcohol use Yes     Comment: occasional     Allergies   Demerol   Review of Systems Review of Systems  Constitutional: Negative for chills, diaphoresis, fatigue and fever.  HENT: Negative for congestion and rhinorrhea.   Eyes: Negative for visual disturbance.  Respiratory: Negative for cough, chest tightness, shortness of breath, wheezing and stridor.   Cardiovascular: Negative for chest pain and palpitations.  Gastrointestinal: Negative for abdominal pain, constipation, diarrhea, nausea and vomiting.  Genitourinary: Positive for difficulty urinating (started not using a cath last week). Negative for dysuria, flank pain and frequency.  Musculoskeletal: Negative for back pain, neck pain and neck stiffness.  Skin:  Positive for wound. Negative for rash.  Neurological: Positive for light-headedness and headaches. Negative for dizziness, tremors, syncope, speech difficulty, weakness and numbness.  Psychiatric/Behavioral: Negative for agitation.  All other systems reviewed and are negative.    Physical Exam Updated Vital Signs BP 134/89 (BP Location: Left Arm)   Pulse 87   Temp 97.7 F (36.5 C) (Oral)   Resp 18   Ht 6' (1.829 m)   Wt 170 lb (77.1 kg)   SpO2 100%   BMI 23.06 kg/m   Physical Exam  Constitutional: He is oriented to person, place, and time. He appears well-developed and well-nourished.  Non-toxic appearance. He does not have a sickly appearance. He does not appear ill. No distress.  HENT:  Head: Head is with laceration. Head is without contusion.    Mouth/Throat: Oropharynx is clear and moist. No oropharyngeal exudate.  Eyes: Conjunctivae and EOM are normal. Pupils are equal, round, and reactive to light.  Neck: Normal range of motion. Neck supple.  Cardiovascular: Normal rate and regular rhythm.   No murmur heard. Pulmonary/Chest: Effort normal and breath sounds normal. No stridor. No respiratory distress. He has no wheezes. He has no rales. He exhibits no tenderness.  Abdominal: Soft. There is no tenderness.  Musculoskeletal: He exhibits no edema or tenderness.  Neurological: He is alert and oriented to person, place, and time. He has normal reflexes. He is not disoriented. He displays normal reflexes. No cranial nerve deficit or sensory deficit. He exhibits normal muscle tone. Coordination and gait normal. GCS eye subscore is 4. GCS verbal subscore is 5. GCS motor subscore is 6.  Skin: Skin is warm and dry. Capillary refill takes less than 2 seconds. Laceration noted.  Psychiatric: He has a normal mood and affect. His speech is normal and behavior is normal.  Nursing note and vitals reviewed.    ED Treatments / Results  Labs (all labs ordered are listed, but only abnormal  results are displayed) Labs Reviewed  BASIC METABOLIC PANEL - Abnormal; Notable for the following:       Result Value   Glucose, Bld 110 (*)    BUN 28 (*)    Creatinine, Ser 1.41 (*)    GFR calc non Af Amer 46 (*)    GFR calc Af Amer 53 (*)    All other components within normal limits  CBC - Abnormal; Notable for the following:    RBC 3.91 (*)    HCT 37.0 (*)    All other components within normal limits  URINALYSIS, ROUTINE W REFLEX MICROSCOPIC (NOT AT [facility]) - Abnormal; Notable for the following:    APPearance CLOUDY (*)    Hgb urine dipstick TRACE (*)    Ketones, ur 15 (*)    Protein, ur 30 (*)    Leukocytes, UA LARGE (*)    All other components within normal limits  URINE MICROSCOPIC-ADD ON - Abnormal; Notable for the following:    Squamous Epithelial / LPF 0-5 (*)    Bacteria, UA MANY (*)    All other components within normal limits  URINE CULTURE  CBG MONITORING, ED    EKG  EKG Interpretation None       Radiology Ct Head Wo Contrast  Result Date: 08/28/2016 CLINICAL DATA:  Fall with head injury. Forehead laceration. Initial encounter. EXAM: CT HEAD WITHOUT CONTRAST CT CERVICAL SPINE WITHOUT CONTRAST TECHNIQUE: Multidetector CT imaging of the head and cervical spine was performed following the standard protocol without intravenous contrast. Multiplanar CT image reconstructions of the cervical spine were also generated. COMPARISON:  Neck CT 07/05/2016 FINDINGS: CT HEAD FINDINGS Brain: No evidence of acute infarction, hemorrhage, hydrocephalus, extra-axial collection or mass lesion/mass effect. Vascular:  Atherosclerosis. Skull: Right supraorbital laceration. Negative for fracture or focal lesion. Sinuses/Orbits: No acute finding. CT CERVICAL SPINE FINDINGS Alignment: No traumatic malalignment Skull base and vertebrae: Negative for fracture Soft tissues and spinal canal: No prevertebral fluid or swelling. No visible canal hematoma. Disc levels: Diffuse degenerative disc  narrowing with spurring greatest at C3-4 and C5-6. Uncovertebral ridging at these levels causes bilateral foraminal stenosis. Facet arthropathy asymmetrically advanced on the left. Upper chest: Negative IMPRESSION: No evidence acute intracranial or cervical spine injury. Electronically Signed   By: Monte Fantasia M.D.   On: 08/28/2016 13:33   Ct Cervical Spine Wo Contrast  Result Date: 08/28/2016 CLINICAL DATA:  Fall with head injury. Forehead laceration. Initial encounter. EXAM: CT HEAD WITHOUT CONTRAST CT CERVICAL SPINE WITHOUT CONTRAST TECHNIQUE: Multidetector CT imaging of the head  and cervical spine was performed following the standard protocol without intravenous contrast. Multiplanar CT image reconstructions of the cervical spine were also generated. COMPARISON:  Neck CT 07/05/2016 FINDINGS: CT HEAD FINDINGS Brain: No evidence of acute infarction, hemorrhage, hydrocephalus, extra-axial collection or mass lesion/mass effect. Vascular:  Atherosclerosis. Skull: Right supraorbital laceration. Negative for fracture or focal lesion. Sinuses/Orbits: No acute finding. CT CERVICAL SPINE FINDINGS Alignment: No traumatic malalignment Skull base and vertebrae: Negative for fracture Soft tissues and spinal canal: No prevertebral fluid or swelling. No visible canal hematoma. Disc levels: Diffuse degenerative disc narrowing with spurring greatest at C3-4 and C5-6. Uncovertebral ridging at these levels causes bilateral foraminal stenosis. Facet arthropathy asymmetrically advanced on the left. Upper chest: Negative IMPRESSION: No evidence acute intracranial or cervical spine injury. Electronically Signed   By: Monte Fantasia M.D.   On: 08/28/2016 13:33    Procedures .Marland KitchenLaceration Repair Date/Time: 08/28/2016 6:09 PM Performed by: Courtney Paris Authorized by: Courtney Paris   Consent:    Consent obtained:  Verbal   Consent given by:  Patient   Risks discussed:  Infection, pain, poor cosmetic  result, need for additional repair, retained foreign body and poor wound healing   Alternatives discussed:  No treatment Universal protocol:    Procedure explained and questions answered to patient or proxy's satisfaction: yes     Imaging studies available: yes     Immediately prior to procedure, a time out was called: yes     Patient identity confirmed:  Verbally with patient and hospital-assigned identification number Anesthesia (see MAR for exact dosages):    Anesthesia method:  Local infiltration   Local anesthetic:  Lidocaine 1% WITH epi Laceration details:    Location:  Face   Face location:  R eyebrow   Length (cm):  4   Depth (mm):  5 Repair type:    Repair type:  Simple Pre-procedure details:    Preparation:  Patient was prepped and draped in usual sterile fashion and imaging obtained to evaluate for foreign bodies Exploration:    Hemostasis achieved with:  Epinephrine   Wound exploration: wound explored through full range of motion and entire depth of wound probed and visualized     Wound extent: no foreign bodies/material noted, no muscle damage noted, no nerve damage noted, no underlying fracture noted and no vascular damage noted     Contaminated: no   Treatment:    Area cleansed with:  Saline   Amount of cleaning:  Standard   Irrigation solution:  Sterile saline   Irrigation volume:  500cc   Irrigation method:  Syringe   Visualized foreign bodies/material removed: no (none seen)   Skin repair:    Repair method:  Sutures   Suture size:  5-0   Suture material:  Plain gut   Suture technique:  Simple interrupted   Number of sutures:  7 Approximation:    Approximation:  Close   Vermilion border: well-aligned   Post-procedure details:    Dressing:  Antibiotic ointment   Patient tolerance of procedure:  Tolerated well, no immediate complications   (including critical care time)  Medications Ordered in ED Medications  Tdap (BOOSTRIX) injection 0.5 mL (0.5 mLs  Intramuscular Given 08/28/16 1407)  lidocaine-EPINEPHrine (XYLOCAINE-EPINEPHrine) 1 %-1:200000 (PF) injection 10 mL (10 mLs Intradermal Given 08/28/16 1556)  bacitracin 500 UNIT/GM ointment (  Given 08/28/16 1639)  fosfomycin (MONUROL) packet 3 g (3 g Oral Given 08/28/16 1659)     Initial Impression / Assessment and  Plan / ED Course  I have reviewed the triage vital signs and the nursing notes.  Pertinent labs & imaging results that were available during my care of the patient were reviewed by me and considered in my medical decision making (see chart for details).  Clinical Course   Jose Frank is a 81 y.o. male with a past medical history significant for adenocarcinoma currently on chemotherapy, hypertension, hyperlipidemia, and coronary artery disease who presents for a fall. Patient reports no loss of consciousness and sustained laceration to right eyebrow. Patient accompanied by wife and they both deny any neurologic deficits on the patient.   Given patient's age, fall causing trauma to face, and Plavix use, patient had head imaging ordered. CT head and C-spine showed no acute intracranial or cervical spine injury. Specifically, no evidence of foreign body in laceration or underlying fracture.  Patient had screening laboratory testing performed to look for occult infection orally for her mallet that might contribute to unsteadiness.   Patient does report that his unsteadiness has continued for the last several months and setting of chemotherapy. Patient also reports he is seeing urology for urinary difficulties. He reports he has been not requiring a helmet and out catheter for last week.  Patient found to have evidence of UTI. No evidence of anemia, leukocytosis, and his creatinine appears improved from prior. No significant molecular abnormalities were seen.  Given patient's UTI, patient treated with fosfomycin in ED. Patient denied dysuria but he does report some difficulty with  urination. Patient will have urine culture sent and will continue his regular follow-up plan with urology. She had no respiratory complaints, no cough, and lung exam was clear thus, chest x-ray was not felt necessary at this time.  Laceration repaired as described above without complication. Patient had absorbable sutures placed. Patient given care management instructions for sutures as well as head injury.  Patient and wife understood plan of care, plans for follow-up, and return precautions. Patient had no other questions or concerns and patient discharged in good condition.   Final Clinical Impressions(s) / ED Diagnoses   Final diagnoses:  UTI (lower urinary tract infection)  Laceration of eyebrow, right, initial encounter  Fall, initial encounter    New Prescriptions Discharge Medication List as of 08/28/2016  4:58 PM      Clinical Impression: 1. UTI (lower urinary tract infection)   2. Laceration of eyebrow, right, initial encounter   3. Fall, initial encounter     Disposition: Discharge  Condition: Good  I have discussed the results, Dx and Tx plan with the pt(& family if present). He/she/they expressed understanding and agree(s) with the plan. Discharge instructions discussed at great length. Strict return precautions discussed and pt &/or family have verbalized understanding of the instructions. No further questions at time of discharge.    Discharge Medication List as of 08/28/2016  4:58 PM      Follow Up: Leighton Ruff, MD [place] [address] [phone]  Schedule an appointment as soon as possible for a visit  IF symptoms worsen, please return to the nearest ED.     Gwenyth Allegra Nikko Goldwire, MD 08/28/16 Tresa Moore

## 2016-08-31 LAB — URINE CULTURE

## 2016-09-01 ENCOUNTER — Telehealth (HOSPITAL_BASED_OUTPATIENT_CLINIC_OR_DEPARTMENT_OTHER): Payer: Self-pay

## 2016-09-01 NOTE — Telephone Encounter (Signed)
Symptom check for UTI symptoms since treatment in ED on 08/28/2016. NO further problems. "I feel fine" instructed to return if further symptoms or to call PCP. Verbalized understanding.

## 2016-09-01 NOTE — Progress Notes (Signed)
ED Antimicrobial Stewardship Positive Culture Follow Up   Jose Frank is an 81 y.o. male who presented to [facility] [place] on 08/28/2016 with a chief complaint of  Chief Complaint  Patient presents with  . Fall  . Laceration  . Dizziness    Recent Results (from the past 720 hour(s))  Urine culture     Status: Abnormal   Collection Time: 08/28/16 11:42 AM  Result Value Ref Range Status   Specimen Description URINE, RANDOM  Final   Special Requests NONE  Final   Culture (A)  Final    30,000 COLONIES/mL STAPHYLOCOCCUS SPECIES (COAGULASE NEGATIVE)   Report Status 08/31/2016 FINAL  Final   Organism ID, Bacteria STAPHYLOCOCCUS SPECIES (COAGULASE NEGATIVE) (A)  Final      Susceptibility   Staphylococcus species (coagulase negative) - MIC*    CIPROFLOXACIN <=0.5 SENSITIVE Sensitive     GENTAMICIN <=0.5 SENSITIVE Sensitive     NITROFURANTOIN <=16 SENSITIVE Sensitive     OXACILLIN 2 RESISTANT Resistant     TETRACYCLINE <=1 SENSITIVE Sensitive     VANCOMYCIN <=0.5 SENSITIVE Sensitive     TRIMETH/SULFA <=10 SENSITIVE Sensitive     CLINDAMYCIN <=0.25 RESISTANT Resistant     RIFAMPIN <=0.5 SENSITIVE Sensitive     Inducible Clindamycin POSITIVE Resistant     * 30,000 COLONIES/mL STAPHYLOCOCCUS SPECIES (COAGULASE NEGATIVE)    [x]  Treated with fosfomycin x 1 however, this dose is inadequate for males []  Patient discharged originally without antimicrobial agent and treatment is now indicated  New antibiotic prescription: If pt has UTI symptoms, would complete fosfomycin regimen with 3gm today then 3gm in 3 days  ED Provider: Jeannett Senior, PA    [place], Rande Lawman 09/01/2016, 8:59 AM Clinical Pharmacist Phone# [phone]

## 2016-09-02 ENCOUNTER — Other Ambulatory Visit: Payer: Medicare Other

## 2016-09-03 DIAGNOSIS — R972 Elevated prostate specific antigen [PSA]: Secondary | ICD-10-CM | POA: Diagnosis not present

## 2016-09-03 DIAGNOSIS — R338 Other retention of urine: Secondary | ICD-10-CM | POA: Diagnosis not present

## 2016-09-19 DIAGNOSIS — R2689 Other abnormalities of gait and mobility: Secondary | ICD-10-CM | POA: Diagnosis not present

## 2016-09-19 DIAGNOSIS — Z23 Encounter for immunization: Secondary | ICD-10-CM | POA: Diagnosis not present

## 2016-09-19 DIAGNOSIS — M79671 Pain in right foot: Secondary | ICD-10-CM | POA: Diagnosis not present

## 2016-09-19 DIAGNOSIS — B351 Tinea unguium: Secondary | ICD-10-CM | POA: Diagnosis not present

## 2016-09-19 DIAGNOSIS — R531 Weakness: Secondary | ICD-10-CM | POA: Diagnosis not present

## 2016-09-19 DIAGNOSIS — C089 Malignant neoplasm of major salivary gland, unspecified: Secondary | ICD-10-CM | POA: Diagnosis not present

## 2016-09-19 DIAGNOSIS — C799 Secondary malignant neoplasm of unspecified site: Secondary | ICD-10-CM | POA: Diagnosis not present

## 2016-10-07 ENCOUNTER — Ambulatory Visit (INDEPENDENT_AMBULATORY_CARE_PROVIDER_SITE_OTHER): Payer: Medicare Other | Admitting: Podiatry

## 2016-10-07 ENCOUNTER — Ambulatory Visit (INDEPENDENT_AMBULATORY_CARE_PROVIDER_SITE_OTHER): Payer: Medicare Other

## 2016-10-07 ENCOUNTER — Encounter: Payer: Self-pay | Admitting: Podiatry

## 2016-10-07 DIAGNOSIS — M2042 Other hammer toe(s) (acquired), left foot: Secondary | ICD-10-CM

## 2016-10-07 DIAGNOSIS — M79671 Pain in right foot: Secondary | ICD-10-CM

## 2016-10-07 DIAGNOSIS — G629 Polyneuropathy, unspecified: Secondary | ICD-10-CM | POA: Diagnosis not present

## 2016-10-07 DIAGNOSIS — M2041 Other hammer toe(s) (acquired), right foot: Secondary | ICD-10-CM

## 2016-10-07 DIAGNOSIS — M79674 Pain in right toe(s): Secondary | ICD-10-CM

## 2016-10-07 DIAGNOSIS — M79672 Pain in left foot: Secondary | ICD-10-CM

## 2016-10-07 DIAGNOSIS — B351 Tinea unguium: Secondary | ICD-10-CM | POA: Diagnosis not present

## 2016-10-07 DIAGNOSIS — M79675 Pain in left toe(s): Secondary | ICD-10-CM | POA: Diagnosis not present

## 2016-10-07 MED ORDER — NONFORMULARY OR COMPOUNDED ITEM: 2 refills | Status: DC

## 2016-10-07 NOTE — Progress Notes (Signed)
   Subjective:    Patient ID: Jose Frank, male    DOB: 09/12/1935, 81 y.o.   MRN: [id number]  HPI  81 year old male presents the office today for concerns of bilateral toe pain. He states he feels that there is a piece of cardboard underneath his toes. This started after he had recent chemotherapy. He states that he has trouble walking due to the discomfort to his toes. This is at all at toes. He describes also a tingling but no numbness to his toes. He's had no recent treatment for this. He has a states his nails are thickened, discolored, elongated which he cannot trim them himself and they're painful pressure in shoes. He's had no recent treatment for this. No other complaints.  Review of Systems  Constitutional: Positive for chills, fatigue and unexpected weight change.  HENT: Positive for hearing loss.   Genitourinary: Positive for frequency.  Musculoskeletal: Positive for gait problem.  Skin:       Change in nails   All other systems reviewed and are negative.      Objective:   Physical Exam General: AAO x3, NAD  Dermatological: Nails are hypertrophic, dystrophic, brittle, discolored, elongated 10. No surrounding redness or drainage. Tenderness nails 1-5 bilaterally. No open lesions or pre-ulcerative lesions are identified today.  Vascular: Dorsalis Pedis artery and Posterior Tibial artery pedal pulses are 2/4 bilateral with immedate capillary fill time. There is no pain with calf compression, swelling, warmth, erythema.   Neruologic: Sensation decreased with Derrel Nip monofilament of the digits. Mild decrease in vibratory sensation    Musculoskeletal: Rigid hammertoe contractures present bilaterally No pain, crepitus, or limitation noted with foot and ankle range of motion bilateral. Muscular strength 5/5 in all groups tested bilateral.  Gait: Unassisted, Nonantalgic.     Assessment & Plan:  81 year old male with bilateral hammertoes, neuropathy, symptomatic  onychomycosis -Treatment options discussed including all alternatives, risks, and complications -Etiology of symptoms were discussed -X-rays were obtained and reviewed with the patient. Hammertoes are present. No evidence of acute fracture.  -Nails debrided 10 without complications or bleeding  -Toe crest were dispensed  -Order compound cream through [facility] for neuropathy. Discussed with him oral gabapentin but since he has had a balance problem I will hold off on this medication for now.  -Follow-up in 9 weeks or sooner if any problems arise. In the meantime, encouraged to call the office with any questions, concerns, change in symptoms.   Celesta Gentile, DMP

## 2016-10-25 ENCOUNTER — Ambulatory Visit (HOSPITAL_COMMUNITY)
Admission: RE | Admit: 2016-10-25 | Discharge: 2016-10-25 | Disposition: A | Discharge status: Home / Self Care | Payer: Medicare Other | Source: Ambulatory Visit | Attending: Internal Medicine | Admitting: Internal Medicine

## 2016-10-25 ENCOUNTER — Encounter (HOSPITAL_COMMUNITY): Payer: Self-pay

## 2016-10-25 ENCOUNTER — Other Ambulatory Visit (HOSPITAL_BASED_OUTPATIENT_CLINIC_OR_DEPARTMENT_OTHER): Payer: Medicare Other

## 2016-10-25 DIAGNOSIS — C089 Malignant neoplasm of major salivary gland, unspecified: Secondary | ICD-10-CM

## 2016-10-25 DIAGNOSIS — Z5111 Encounter for antineoplastic chemotherapy: Secondary | ICD-10-CM | POA: Diagnosis not present

## 2016-10-25 DIAGNOSIS — D490 Neoplasm of unspecified behavior of digestive system: Secondary | ICD-10-CM

## 2016-10-25 DIAGNOSIS — R911 Solitary pulmonary nodule: Secondary | ICD-10-CM | POA: Diagnosis not present

## 2016-10-25 DIAGNOSIS — C7951 Secondary malignant neoplasm of bone: Secondary | ICD-10-CM | POA: Diagnosis not present

## 2016-10-25 DIAGNOSIS — C080 Malignant neoplasm of submandibular gland: Secondary | ICD-10-CM | POA: Diagnosis not present

## 2016-10-25 DIAGNOSIS — C799 Secondary malignant neoplasm of unspecified site: Secondary | ICD-10-CM | POA: Diagnosis present

## 2016-10-25 DIAGNOSIS — C801 Malignant (primary) neoplasm, unspecified: Secondary | ICD-10-CM

## 2016-10-25 LAB — CBC WITH DIFFERENTIAL/PLATELET
BASO%: 0.6 % (ref 0.0–2.0)
Basophils Absolute: 0 10*3/uL (ref 0.0–0.1)
EOS%: 2.2 % (ref 0.0–7.0)
Eosinophils Absolute: 0.1 10*3/uL (ref 0.0–0.5)
HEMATOCRIT: 40.8 % (ref 38.4–49.9)
HGB: 13.8 g/dL (ref 13.0–17.1)
LYMPH#: 1.3 10*3/uL (ref 0.9–3.3)
LYMPH%: 24.4 % (ref 14.0–49.0)
MCH: 31.7 pg (ref 27.2–33.4)
MCHC: 33.8 g/dL (ref 32.0–36.0)
MCV: 93.8 fL (ref 79.3–98.0)
MONO#: 0.4 10*3/uL (ref 0.1–0.9)
MONO%: 7.3 % (ref 0.0–14.0)
NEUT#: 3.5 10*3/uL (ref 1.5–6.5)
NEUT%: 65.5 % (ref 39.0–75.0)
Platelets: 180 10*3/uL (ref 140–400)
RBC: 4.35 10*6/uL (ref 4.20–5.82)
RDW: 12 % (ref 11.0–14.6)
WBC: 5.4 10*3/uL (ref 4.0–10.3)

## 2016-10-25 LAB — COMPREHENSIVE METABOLIC PANEL
ALT: 18 U/L (ref 0–55)
AST: 20 U/L (ref 5–34)
Albumin: 3.8 g/dL (ref 3.5–5.0)
Alkaline Phosphatase: 86 U/L (ref 40–150)
Anion Gap: 10 mEq/L (ref 3–11)
BUN: 26.6 mg/dL — AB (ref 7.0–26.0)
CALCIUM: 9.8 mg/dL (ref 8.4–10.4)
CHLORIDE: 107 meq/L (ref 98–109)
CO2: 23 meq/L (ref 22–29)
CREATININE: 1.7 mg/dL — AB (ref 0.7–1.3)
EGFR: 37 mL/min/{1.73_m2} — ABNORMAL LOW (ref >90)
GLUCOSE: 104 mg/dL (ref 70–140)
POTASSIUM: 4.4 meq/L (ref 3.5–5.1)
SODIUM: 140 meq/L (ref 136–145)
Total Bilirubin: 0.7 mg/dL (ref 0.20–1.20)
Total Protein: 7.2 g/dL (ref 6.4–8.3)

## 2016-10-25 MED ORDER — IOPAMIDOL (ISOVUE-300) INJECTION 61%
75.0000 mL | Freq: Once | INTRAVENOUS | Status: AC | PRN
  Administered 2016-10-25: 75 mL via INTRAVENOUS

## 2016-10-29 ENCOUNTER — Telehealth: Payer: Self-pay | Admitting: Internal Medicine

## 2016-10-29 ENCOUNTER — Ambulatory Visit (HOSPITAL_BASED_OUTPATIENT_CLINIC_OR_DEPARTMENT_OTHER): Payer: Medicare Other | Admitting: Internal Medicine

## 2016-10-29 ENCOUNTER — Encounter: Payer: Self-pay | Admitting: Internal Medicine

## 2016-10-29 VITALS — BP 136/77 | HR 78 | Temp 97.4°F | Resp 16 | Ht 72.0 in | Wt 182.8 lb

## 2016-10-29 DIAGNOSIS — C7951 Secondary malignant neoplasm of bone: Secondary | ICD-10-CM

## 2016-10-29 DIAGNOSIS — C801 Malignant (primary) neoplasm, unspecified: Secondary | ICD-10-CM | POA: Diagnosis not present

## 2016-10-29 DIAGNOSIS — C770 Secondary and unspecified malignant neoplasm of lymph nodes of head, face and neck: Secondary | ICD-10-CM | POA: Diagnosis not present

## 2016-10-29 DIAGNOSIS — G620 Drug-induced polyneuropathy: Secondary | ICD-10-CM | POA: Diagnosis not present

## 2016-10-29 DIAGNOSIS — C089 Malignant neoplasm of major salivary gland, unspecified: Secondary | ICD-10-CM

## 2016-10-29 DIAGNOSIS — C799 Secondary malignant neoplasm of unspecified site: Secondary | ICD-10-CM

## 2016-10-29 MED ORDER — GABAPENTIN 100 MG PO CAPS: 100.0000 mg | ORAL_CAPSULE | Freq: Two times a day (BID) | ORAL | 3 refills | Status: DC

## 2016-10-29 NOTE — Progress Notes (Signed)
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 Pablo Ledger. This is expected to be completed on 05/07/2016. 2) systemic chemotherapy with carboplatin for AUC of 5 and paclitaxel 200 MG/M2 every 3 weeks. First dose 05/08/2016. Status post 4 cycles.  CURRENT THERAPY: Observation.    INTERVAL HISTORY: Jose Frank 81 y.o. male returns to the clinic today for follow-up visit accompanied by his wife, Haynes Dage and his niece Lattie Haw. The patient is doing fine today with no specific complaints except for peripheral neuropathy in the toes. He denied having any significant fatigue or weakness. He denied having any significant weight loss or night sweats. He has no chest pain, shortness breath, cough or hemoptysis. He had repeat CT scan of the neck and chest performed recently and he is here for evaluation and discussion of his scan results.  MEDICAL HISTORY: Past Medical History:  Diagnosis Date  . 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 Encounter for antineoplastic chemotherapy 05/27/2016  . History of echocardiogram    Echo 8/14:  EF 55-60%, Gr 1 DD  . Hx of cardiovascular stress test    a.  ETT-Myoview 8/14:  No scar or ischemia, EF 52%, normal study;  b. ETT-Myoview:  EF 55%, no ST changes, no ischemia; Normal   . Hyperlipidemia   . Hypertension   . IHD (ischemic heart disease)    Remote PCI to distal RCA and atherectomy of th LAD in 1996; S/P PCI to LCX and RCA in 2010.  . Metastatic adenocarcinoma ([place]) 04/04/2016  . Renal insufficiency    on Diovan  . Submandibular  lymphadenopathy 03/21/2016    ALLERGIES:  is allergic to demerol and myrbetriq [mirabegron].  MEDICATIONS:  Current Outpatient Prescriptions  Medication Sig Dispense Refill  . amLODipine (NORVASC) 5 MG tablet Take 1 tablet by mouth  daily (Patient taking differently: Take 5mg  tablet by once mouth evening) 90 tablet 3  . aspirin 81 MG tablet Take 81 mg by mouth daily.      . clopidogrel (PLAVIX) 75 MG tablet Take 1 tablet by mouth  daily (Patient taking differently: Take 74mg  tablet by mouth  once daily) 90 tablet 3  . diphenhydramine-acetaminophen (TYLENOL PM) 25-500 MG TABS tablet Take 1-2 tablets by mouth at bedtime as needed (sleep). Reported on 06/17/2016    . GLUCOSAMINE-CHONDROITIN PO Take 1 tablet by mouth 2 (two) times daily. Reported on 06/07/2016    . metoprolol (LOPRESSOR) 50 MG tablet Take 25-50 mg by mouth 2 (two) times daily. Take 50mg  in the morning and 25mg  in the evening.    . Multiple Vitamin (MULTIVITAMIN) tablet Take 1 tablet by mouth daily.      . nitroGLYCERIN (NITROSTAT) 0.4 MG SL tablet Place 0.4 mg under the tongue every 5 (five) minutes as needed for chest pain (x 3 doses). Reported on 06/17/2016    . NONFORMULARY OR COMPOUNDED ITEM [facility]:  Peripheral Neuropathy Cream-Bupivacaine 1%, Doxepin 3%, Gabapentin 6%, Pentoxifylline 3%, Topiramate 1%, apply1-2 grams to affected area 3-4 time daily. 120 each 2  . oxyCODONE-acetaminophen (PERCOCET/ROXICET) 5-325 MG tablet Take 1 tablet by mouth  every 6 (six) hours as needed for severe pain. 30 tablet 0  . prochlorperazine (COMPAZINE) 10 MG tablet Take 1 tablet (10 mg total) by mouth every 6 (six) hours as needed for nausea or vomiting. 30 tablet 0  . rosuvastatin (CRESTOR) 10 MG tablet Take 10 mg by mouth every evening.     . tamsulosin (FLOMAX) 0.4 MG CAPS capsule Take 0.4 mg by mouth every evening.   11   No current facility-administered medications for this visit.     SURGICAL HISTORY:  Past Surgical History:    Procedure Laterality Date  . CARDIAC CATHETERIZATION  09/13/2009  . CARDIAC CATHETERIZATION  09/04/2009   EF 60%  . CARDIOVASCULAR STRESS TEST  12/08/2007   EF 57%  . PCI  08/2009   TO THE LCX AND RCA  . TONSILLECTOMY      REVIEW OF SYSTEMS:  A comprehensive review of systems was negative except for: Neurological: positive for paresthesia   PHYSICAL EXAMINATION: General appearance: alert, cooperative and no distress Head: Normocephalic, without obvious abnormality, atraumatic Neck: no carotid bruit, no JVD, supple, symmetrical, trachea midline, thyroid not enlarged, symmetric, no tenderness/mass/nodules and large submandibular mass Lymph nodes: large submandibular mass Resp: clear to auscultation bilaterally Back: symmetric, no curvature. ROM normal. No CVA tenderness. Cardio: regular rate and rhythm, S1, S2 normal, no murmur, click, rub or gallop GI: soft, non-tender; bowel sounds normal; no masses,  no organomegaly Extremities: extremities normal, atraumatic, no cyanosis or edema Neurologic: Alert and oriented X 3, normal strength and tone. Normal symmetric reflexes. Normal coordination and gait  ECOG PERFORMANCE STATUS: 1 - Symptomatic but completely ambulatory  Blood pressure 136/77, pulse 78, temperature 97.4 F (36.3 C), temperature source Oral, resp. rate 16, height 6' (1.829 m), weight 182 lb 12.8 oz (82.9 kg), SpO2 99 %.  LABORATORY DATA: Lab Results  Component Value Date   WBC 5.4 10/25/2016   HGB 13.8 10/25/2016   HCT 40.8 10/25/2016   MCV 93.8 10/25/2016   PLT 180 10/25/2016      Chemistry      Component Value Date/Time   NA 140 10/25/2016 1042   K 4.4 10/25/2016 1042   CL 106 08/28/2016 1230   CO2 23 10/25/2016 1042   BUN 26.6 (H) 10/25/2016 1042   CREATININE 1.7 (H) 10/25/2016 1042      Component Value Date/Time   CALCIUM 9.8 10/25/2016 1042   ALKPHOS 86 10/25/2016 1042   AST 20 10/25/2016 1042   ALT 18 10/25/2016 1042   BILITOT 0.70 10/25/2016 1042        RADIOGRAPHIC STUDIES: Ct Soft Tissue Neck W Contrast  Result Date: 10/25/2016 CLINICAL DATA:  Metastatic adenocarcinoma. Mucoepidermoid carcinoma of salivary gland EXAM: CT NECK WITH CONTRAST TECHNIQUE: Multidetector CT imaging of the neck was performed using the standard protocol following the bolus administration of intravenous contrast. CONTRAST:  91mL ISOVUE-300 IOPAMIDOL (ISOVUE-300) INJECTION 61% COMPARISON:  CT neck 07/05/2016. FINDINGS: Pharynx and larynx: Nasopharynx and oropharynx normal. Tongue remains normal. Epiglottis and larynx normal. Salivary glands: Mass lesion in the right sublingual and submandibular space is unchanged from the prior study. Margins are poorly defined and measurements are difficult however on axial images the submandibular tumor measures approximately 24 x 25 mm. Left submandibular gland is atrophic or surgically absent. Parotid gland mildly atrophic bilaterally Thyroid: Negative Lymph nodes: No enlarged lymph nodes. Vascular: Carotid artery calcification bilaterally. Jugular vein patent bilaterally. Enlarged right external jugular vein unchanged from the prior study. Limited intracranial: Negative Visualized orbits:  Negative Mastoids and visualized paranasal sinuses: Negative Skeleton: Cervical spine degenerative change. No fracture or bony metastatic disease. No invasion of the mandible. Upper chest: Chest CT from today reported separately Other: None IMPRESSION: Mass lesion in the right sublingual and submandibular space is unchanged from the prior study. No new adenopathy or mass identified. Electronically Signed   By: Franchot Gallo M.D.   On: 10/25/2016 15:57   Ct Chest W Contrast  Result Date: 10/25/2016 CLINICAL DATA:  Salivary gland cancer, metastases to left chest/ribs, status post chemotherapy and XRT EXAM: CT CHEST WITH CONTRAST TECHNIQUE: Multidetector CT imaging of the chest was performed during intravenous contrast administration. CONTRAST:  35mL  ISOVUE-300 IOPAMIDOL (ISOVUE-300) INJECTION 61% COMPARISON:  07/05/2016 FINDINGS: Cardiovascular: The heart is normal in size. No pericardial effusion. Three vessel coronary atherosclerosis. Ectasia of the ascending thoracic aorta, measuring 3.8 cm. Mild atherosclerotic calcifications aortic arch. Mediastinum/Nodes: No suspicious mediastinal lymphadenopathy. Visualized right thyroid is notable for an 8 mm right thyroid nodule. Lungs/Pleura: Radiation changes in the left lower lobe (series 8/ image 86), new. 6 mm nodule in the posterior right lung base (series 8/ image 149), unchanged. No new/ suspicious pulmonary nodules. Mild biapical pleural-parenchymal scarring. No focal consolidation. No pleural effusion or pneumothorax. Upper Abdomen: Visualized upper abdomen is notable for vascular calcifications. Musculoskeletal: Degenerative changes of the visualized thoracolumbar spine. Expansile lytic lesion involving the left posterolateral 7th rib (series 3/ image 58), corresponding to known osseous metastasis, grossly unchanged. IMPRESSION: Osseous metastasis involving the left posterolateral 7th rib, grossly unchanged. Underlying radiation changes in the left lower lobe. No evidence of new/progressive metastatic disease. 6 mm nodule in the posterior right lung base, unchanged. Electronically Signed   By: Julian Hy M.D.   On: 10/25/2016 17:39   Dg Foot Complete Left  Result Date: 10/23/2016 Hammertoes are present. No evidence of acute fracture.   Dg Foot Complete Right  Result Date: 10/23/2016 Hammertoes are present. No evidence of acute fracture.    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 Status post palliative radiotherapy to the left seventh rib lesion. The patient underwent systemic chemotherapy with  carboplatin and paclitaxel status post 4 cycles. He tolerated his treatment well except for the fatigue and the peripheral neuropathy. He is currently on observation and the recent CT scan of the chest and neck showed no evidence for disease progression. I discussed the scan results with the patient and his family today. I recommended for him to continue on observation with repeat CT scan of the neck and chest in 3 months. For the peripheral lateral C, I will start the patient on Neurontin 100 mg by mouth twice a day. He was advised to call immediately if he has any concerning symptoms in the interval. The patient voices understanding of current disease status and treatment options and is in agreement with the current care plan.  All questions were answered. The patient knows to call the clinic with any problems, questions or concerns. We can certainly see the patient much sooner if necessary.  Disclaimer: This note was dictated with voice recognition software. Similar sounding words can inadvertently be transcribed and may not be corrected upon review.

## 2016-10-29 NOTE — Telephone Encounter (Signed)
Appointments scheduled per 10/29/16 los. A copy of the AVS report and appointment schedule, was given to patient, per 10/29/16 los. °

## 2016-11-12 DIAGNOSIS — H5203 Hypermetropia, bilateral: Secondary | ICD-10-CM | POA: Diagnosis not present

## 2016-11-12 DIAGNOSIS — H52223 Regular astigmatism, bilateral: Secondary | ICD-10-CM | POA: Diagnosis not present

## 2016-11-12 DIAGNOSIS — H2513 Age-related nuclear cataract, bilateral: Secondary | ICD-10-CM | POA: Diagnosis not present

## 2016-11-12 DIAGNOSIS — H353131 Nonexudative age-related macular degeneration, bilateral, early dry stage: Secondary | ICD-10-CM | POA: Diagnosis not present

## 2016-12-10 DIAGNOSIS — H02839 Dermatochalasis of unspecified eye, unspecified eyelid: Secondary | ICD-10-CM | POA: Diagnosis not present

## 2016-12-10 DIAGNOSIS — H40033 Anatomical narrow angle, bilateral: Secondary | ICD-10-CM | POA: Diagnosis not present

## 2016-12-10 DIAGNOSIS — H18411 Arcus senilis, right eye: Secondary | ICD-10-CM | POA: Diagnosis not present

## 2016-12-10 DIAGNOSIS — H353130 Nonexudative age-related macular degeneration, bilateral, stage unspecified: Secondary | ICD-10-CM | POA: Diagnosis not present

## 2016-12-10 DIAGNOSIS — H2511 Age-related nuclear cataract, right eye: Secondary | ICD-10-CM | POA: Diagnosis not present

## 2016-12-10 DIAGNOSIS — R972 Elevated prostate specific antigen [PSA]: Secondary | ICD-10-CM | POA: Diagnosis not present

## 2016-12-10 DIAGNOSIS — I10 Essential (primary) hypertension: Secondary | ICD-10-CM | POA: Diagnosis not present

## 2016-12-16 ENCOUNTER — Encounter: Payer: Self-pay | Admitting: Podiatry

## 2016-12-16 ENCOUNTER — Ambulatory Visit (INDEPENDENT_AMBULATORY_CARE_PROVIDER_SITE_OTHER): Payer: Medicare Other | Admitting: Podiatry

## 2016-12-16 DIAGNOSIS — M79675 Pain in left toe(s): Secondary | ICD-10-CM | POA: Diagnosis not present

## 2016-12-16 DIAGNOSIS — G629 Polyneuropathy, unspecified: Secondary | ICD-10-CM | POA: Diagnosis not present

## 2016-12-16 DIAGNOSIS — M79674 Pain in right toe(s): Secondary | ICD-10-CM

## 2016-12-16 DIAGNOSIS — B351 Tinea unguium: Secondary | ICD-10-CM

## 2016-12-16 NOTE — Progress Notes (Signed)
Subjective: 82 y.o. returns the office today for painful, elongated, thickened toenails which he cannot trim himself. Denies any redness or drainage around the nails. Since last appointment he has been started on gabapentin 100 mg twice a day which is helping. He still feels that there is a plastic sensation under his feet. He is not having specific pain to his feet. Denies any acute changes since last appointment and no new complaints today. Denies any systemic complaints such as fevers, chills, nausea, vomiting.   Objective: AAO 3, NAD DP/PT pulses palpable, CRT less than 3 seconds Protective sensation decreased with Simms Weinstein monofilament, Achilles tendon reflex intact.  Nails hypertrophic, dystrophic, elongated, brittle, discolored 10. There is tenderness overlying the nails 1-5 bilaterally. There is no surrounding erythema or drainage along the nail sites. No open lesions or pre-ulcerative lesions are identified. No other areas of tenderness bilateral lower extremities. No overlying edema, erythema, increased warmth. No pain with calf compression, swelling, warmth, erythema.  Assessment: Patient presents with symptomatic onychomycosis, neuropathy   Plan: -Treatment options including alternatives, risks, complications were discussed -Nails sharply debrided 10 without complication/bleeding. -Continue gabapentin as prescribed. Continue with compound cream as well as has not been helping much but may be beneficial as an adjunct.  -Discussed daily foot inspection. If there are any changes, to call the office immediately.  -Follow-up in 3 months or sooner if any problems are to arise. In the meantime, encouraged to call the office with any questions, concerns, changes symptoms.  Celesta Gentile, DPM

## 2016-12-20 DIAGNOSIS — H40032 Anatomical narrow angle, left eye: Secondary | ICD-10-CM | POA: Diagnosis not present

## 2017-01-10 ENCOUNTER — Encounter: Payer: Self-pay | Admitting: Cardiovascular Disease

## 2017-01-10 ENCOUNTER — Ambulatory Visit (INDEPENDENT_AMBULATORY_CARE_PROVIDER_SITE_OTHER): Payer: Medicare Other | Admitting: Cardiovascular Disease

## 2017-01-10 VITALS — BP 144/84 | HR 67 | Ht 72.0 in | Wt 182.1 lb

## 2017-01-10 DIAGNOSIS — I10 Essential (primary) hypertension: Secondary | ICD-10-CM | POA: Diagnosis not present

## 2017-01-10 DIAGNOSIS — I2510 Atherosclerotic heart disease of native coronary artery without angina pectoris: Secondary | ICD-10-CM

## 2017-01-10 NOTE — Patient Instructions (Signed)

## 2017-01-10 NOTE — Progress Notes (Signed)
Cardiology Office Note Date:  01/10/2017   ID:  Jose Frank, DOB [birth year], MRN [id number]  PCP:  Gerrit Heck, MD  Cardiologist:  Sherren Mocha, MD    No chief complaint on file.    History of Present Illness: Jose Frank is a 82 y.o. male who presents for follow-up of CAD.  The patient has coronary artery disease with a history of multiple PCI procedures. His most recent PCI procedure was in 2010 when he was treated with stenting of the LCx and complex PCI utilizing 4 stents in the right coronary artery.  He has had an eventful year since I have seen him. Diagnosed with metastatic submandibular gland carcinoma and has undergone radiation to a rib lesion as well as chemotherapy. He complains of peripheral neuropathy since undergoing chemoRx. 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 Denies chest pain, shortness of breath, or leg swelling.    Past Medical History:  Diagnosis Date  . 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 Encounter for antineoplastic chemotherapy 05/27/2016  . History of echocardiogram    Echo 8/14:  EF 55-60%, Gr 1 DD  . Hx of cardiovascular stress test    a.  ETT-Myoview 8/14:  No scar or ischemia, EF 52%, normal study;  b. ETT-Myoview:  EF 55%, no ST changes, no ischemia; Normal   . Hyperlipidemia   . Hypertension   . IHD (ischemic heart disease)    Remote PCI to distal RCA and atherectomy of th LAD in 1996; S/P PCI to LCX and RCA in 2010.  . Metastatic adenocarcinoma ([name]) 04/04/2016  . Renal insufficiency    on Diovan  . Submandibular lymphadenopathy 03/21/2016    Past Surgical History:  Procedure Laterality Date  . CARDIAC CATHETERIZATION  09/13/2009  . CARDIAC CATHETERIZATION  09/04/2009   EF 60%  . CARDIOVASCULAR STRESS TEST  12/08/2007   EF 57%  . PCI  08/2009   TO THE LCX AND RCA  . TONSILLECTOMY      Current Outpatient Prescriptions  Medication Sig Dispense Refill  . amLODipine (NORVASC) 5 MG tablet Take 1 tablet by mouth   daily (Patient taking differently: Take 5mg  tablet by once mouth evening) 90 tablet 3  . aspirin 81 MG tablet Take 81 mg by mouth daily.      . clopidogrel (PLAVIX) 75 MG tablet Take 1 tablet by mouth  daily (Patient taking differently: Take 74mg  tablet by mouth  once daily) 90 tablet 3  . diphenhydramine-acetaminophen (TYLENOL PM) 25-500 MG TABS tablet Take 1-2 tablets by mouth at bedtime as needed (sleep). Reported on 06/17/2016    . gabapentin (NEURONTIN) 100 MG capsule Take 1 capsule (100 mg total) by mouth 2 (two) times daily. 60 capsule 3  . GLUCOSAMINE-CHONDROITIN PO Take 1 tablet by mouth 2 (two) times daily. Reported on 06/07/2016    . metoprolol (LOPRESSOR) 50 MG tablet Take 25-50 mg by mouth 2 (two) times daily. Take 50mg  in the morning and 25mg  in the evening.    . Multiple Vitamin (MULTIVITAMIN) tablet Take 1 tablet by mouth daily.      . nitroGLYCERIN (NITROSTAT) 0.4 MG SL tablet Place 0.4 mg under the tongue every 5 (five) minutes as needed for chest pain (x 3 doses). Reported on 06/17/2016    . NONFORMULARY OR COMPOUNDED ITEM [facility]:  Peripheral Neuropathy Cream-Bupivacaine 1%, Doxepin 3%, Gabapentin 6%, Pentoxifylline 3%, Topiramate 1%, apply1-2 grams to affected area 3-4 time daily. 120 each 2  .  oxyCODONE-acetaminophen (PERCOCET/ROXICET) 5-325 MG tablet Take 1 tablet by mouth every 6 (six) hours as needed for severe pain. 30 tablet 0  . prochlorperazine (COMPAZINE) 10 MG tablet Take 1 tablet (10 mg total) by mouth every 6 (six) hours as needed for nausea or vomiting. 30 tablet 0  . rosuvastatin (CRESTOR) 10 MG tablet Take 10 mg by mouth every evening.     . tamsulosin (FLOMAX) 0.4 MG CAPS capsule Take 0.4 mg by mouth every evening.   11   No current facility-administered medications for this visit.     Allergies:   Demerol and Myrbetriq [mirabegron]   Social History:  The patient  reports that he has never smoked. He has never used smokeless tobacco. He reports that he  drinks alcohol. He reports that he does not use drugs.   Family History:  The patient's  family history includes Heart disease in his father; Stroke in his mother.    ROS:  Please see the history of present illness.  Otherwise, review of systems is positive for chills, balance problems.  All other systems are reviewed and negative.    PHYSICAL EXAM: VS:  BP (!) 144/84   Pulse 67   Ht 6' (1.829 m)   Wt 182 lb 1.9 oz (82.6 kg)   BMI 24.70 kg/m  , BMI Body mass index is 24.7 kg/m. GEN: Well nourished, well developed, in no acute distress  HEENT: normal  Neck: no JVD, palpable mass in the right neck. No carotid bruits Cardiac: RRR without murmur or gallop                Respiratory:  clear to auscultation bilaterally, normal work of breathing GI: soft, nontender, nondistended, + BS MS: no deformity or atrophy  Ext: no pretibial edema, pedal pulses 2+= bilaterally Skin: warm and dry, no rash Neuro:  Strength and sensation are intact Psych: euthymic mood, full affect  EKG:  EKG is ordered today. The ekg ordered today shows normal sinus rhythm 67 bpm, premature atrial contraction, otherwise within normal limits.  Recent Labs: 10/25/2016: ALT 18; BUN 26.6; Creatinine 1.7; HGB 13.8; Platelets 180; Potassium 4.4; Sodium 140   Lipid Panel     Component Value Date/Time   CHOL 160 07/29/2011 0918   TRIG 99.0 07/29/2011 0918   HDL 61.30 07/29/2011 0918   CHOLHDL 3 07/29/2011 0918   VLDL 19.8 07/29/2011 0918   LDLCALC 79 07/29/2011 0918      Wt Readings from Last 3 Encounters:  01/10/17 182 lb 1.9 oz (82.6 kg)  10/29/16 182 lb 12.8 oz (82.9 kg)  08/28/16 170 lb (77.1 kg)     ASSESSMENT AND PLAN: 1.  CAD, native vessel, without symptoms of angina seems to be doing well. He has undergone remote angioplasty in the 1990s and then complex stenting of the circumflex and right coronary arteries in 2010. He has been treated with a total of 5 stents. He continues to tolerate dual  antiplatelet therapy with aspirin and Plavix without difficulty. His medicines will be continued without change.  2. Hypertension: Blood pressure controlled on amlodipine and metoprolol.  3. Hyperlipidemia: Treated with Crestor  Current medicines are reviewed with the patient today.  The patient does not have concerns regarding medicines.  Labs/ tests ordered today include:  No orders of the defined types were placed in this encounter.   Disposition:   FU one year  Signed, Sherren Mocha, MD  01/10/2017 3:50 PM    [facility]  [address] [place], [place]  [address] Phone: [phone]; Fax: [phone]

## 2017-01-16 IMAGING — CT NM PET TUM IMG INITIAL (PI) SKULL BASE T - THIGH
1 of 8 series · 1 of 25 positions shown · non-contrast
Comparison: Chest CT 03/07/2016.

CLINICAL DATA: Initial treatment strategy for rib lesion.

EXAM:
NUCLEAR MEDICINE PET SKULL BASE TO THIGH
TECHNIQUE: 9.1 mCi F-18 FDG was injected intravenously. Full-ring PET imaging
was performed from the skull base to thigh after the radiotracer. CT
data was obtained and used for attenuation correction and anatomic
localization.
FASTING BLOOD GLUCOSE:  Value: 107 mg/dl

[Series 4: ct sk_thigh 5.0 b31f · axial · 5.0mm · 0.98mm/px · 1 of 221 slices shown]
[im 221/221  brain]
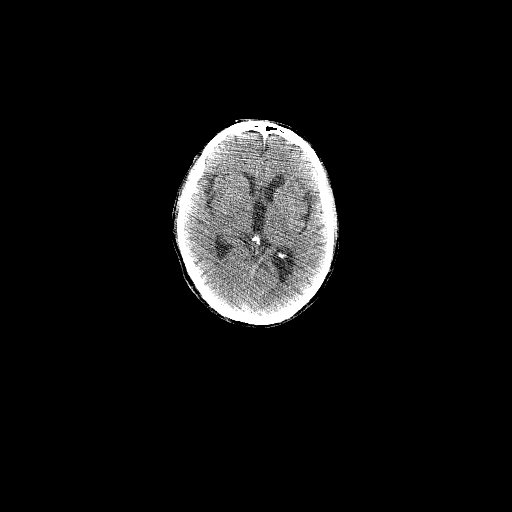

[1 of 25 positions shown; findings below may reference images not displayed]

FINDINGS: NECK

Hypermetabolic soft tissue noted immediately medial to the angle of
the mandible, extending anteriorly, measuring approximately 5.1 x
1.9 cm (image 31 of series 4) demonstrates diffuse hypermetabolism
(SUVmax = 16.8). This appears to represent an enlarged right
submandibular gland, but the activity tracking anteriorly appears to
track along the expected location of the duct. A portion of this
soft tissue extends caudally, although this appears to be glandular
tissue (rather than lymphadenopathy). No definite hypermetabolic
cervical lymph nodes are noted.

CHEST

No hypermetabolic mediastinal or hilar nodes. 6 mm right lower lobe
pulmonary nodule (image 110 of series 4) is noted, but highly
nonspecific. No other suspicious hypermetabolic pulmonary nodules on
the CT scan. Heart size is borderline enlarged. There is no
significant pericardial fluid, thickening or pericardial
calcification. There is atherosclerosis of the thoracic aorta, the
great vessels of the mediastinum and the coronary arteries,
including calcified atherosclerotic plaque in the left main, left
anterior descending, left circumflex and right coronary arteries.
Mild calcifications of the aortic valve. Ectasia of the ascending
thoracic aorta (4.3 cm in diameter).

ABDOMEN/PELVIS

No abnormal hypermetabolic activity within the liver, pancreas,
adrenal glands, or spleen. No hypermetabolic lymph nodes in the
abdomen or pelvis. Two low-attenuation lesions are noted in the
kidneys bilaterally measuring up to 1.7 cm in the anterior aspect of
the interpolar region of the left kidney, which are incompletely
characterized on today's noncontrast CT examination, but demonstrate
no hypermetabolism, favored to represent small cysts.
Atherosclerosis throughout the abdominal and pelvic vasculature,
without evidence of aneurysm. No pathologic dilatation of small
bowel or colon. No significant volume of ascites. No
pneumoperitoneum.

SKELETON

The lesion of concern in the posterolateral aspect of the left
seventh rib appears very similar to the recent chest CT measuring
approximately 7.0 x 2.6 cm and is lytic and hypermetabolic (SUVmax =
18.9), with a nondisplaced pathologic fracture. No other osseous
lesions are noted.
IMPRESSION: 1. Expansile lytic lesion in the posterolateral aspect of the left
seventh rib with associated pathologic fracture demonstrates
hypermetabolism, compatible with malignancy. Primary differential
considerations include a metastatic lesion or focus of myeloma.
2. The only other suspicious area on today's study involves the
right submandibular gland which is enlarged and diffusely
hypermetabolic. Some of this hypermetabolism appears to track
anteriorly along the expected course of the duct for the gland.
These findings could be seen in the setting of primary neoplasm of
the submandibular gland, or could simply represent obstruction of
the duct and hypermetabolism in the gland secondary to
inflammation/infection. Alternatively, the possibility of a
sublingual tumor obstructing the submandibular duct should be
considered. Further evaluation by Otolaryngology is recommended in
the near future.
3. Atherosclerosis, including left main and 3 vessel coronary artery
disease. Assessment for potential risk factor modification, dietary
therapy or pharmacologic therapy may be warranted, if clinically
indicated.
4. In addition, there is ectasia of the ascending thoracic aorta
(4.3 cm in diameter). Recommend annual imaging followup by CTA or
MRA. This recommendation follows 4363
ACCF/AHA/AATS/ACR/ASA/SCA/BLAIN/BILLIOT/COOKS/BAAR Guidelines for the
Diagnosis and Management of Patients with Thoracic Aortic Disease.
Circulation. 4363; 121: [id number].
5. Additional incidental findings, as above.

## 2017-01-24 DIAGNOSIS — Z961 Presence of intraocular lens: Secondary | ICD-10-CM | POA: Diagnosis not present

## 2017-01-24 DIAGNOSIS — H25811 Combined forms of age-related cataract, right eye: Secondary | ICD-10-CM | POA: Diagnosis not present

## 2017-01-24 DIAGNOSIS — H2511 Age-related nuclear cataract, right eye: Secondary | ICD-10-CM | POA: Diagnosis not present

## 2017-01-27 ENCOUNTER — Encounter (HOSPITAL_COMMUNITY): Payer: Self-pay

## 2017-01-27 ENCOUNTER — Ambulatory Visit (HOSPITAL_COMMUNITY)
Admission: RE | Admit: 2017-01-27 | Discharge: 2017-01-27 | Disposition: A | Discharge status: Home / Self Care | Payer: Medicare Other | Source: Ambulatory Visit | Attending: Internal Medicine | Admitting: Internal Medicine

## 2017-01-27 ENCOUNTER — Other Ambulatory Visit (HOSPITAL_BASED_OUTPATIENT_CLINIC_OR_DEPARTMENT_OTHER): Payer: Medicare Other

## 2017-01-27 DIAGNOSIS — R911 Solitary pulmonary nodule: Secondary | ICD-10-CM | POA: Insufficient documentation

## 2017-01-27 DIAGNOSIS — C089 Malignant neoplasm of major salivary gland, unspecified: Secondary | ICD-10-CM | POA: Diagnosis not present

## 2017-01-27 DIAGNOSIS — C801 Malignant (primary) neoplasm, unspecified: Secondary | ICD-10-CM

## 2017-01-27 DIAGNOSIS — C7951 Secondary malignant neoplasm of bone: Secondary | ICD-10-CM | POA: Insufficient documentation

## 2017-01-27 DIAGNOSIS — C799 Secondary malignant neoplasm of unspecified site: Secondary | ICD-10-CM

## 2017-01-27 LAB — COMPREHENSIVE METABOLIC PANEL
ALBUMIN: 4 g/dL (ref 3.5–5.0)
ALK PHOS: 90 U/L (ref 40–150)
ALT: 17 U/L (ref 0–55)
AST: 18 U/L (ref 5–34)
Anion Gap: 9 mEq/L (ref 3–11)
BILIRUBIN TOTAL: 0.61 mg/dL (ref 0.20–1.20)
BUN: 21.2 mg/dL (ref 7.0–26.0)
CO2: 24 mEq/L (ref 22–29)
Calcium: 9.7 mg/dL (ref 8.4–10.4)
Chloride: 108 mEq/L (ref 98–109)
Creatinine: 1.6 mg/dL — ABNORMAL HIGH (ref 0.7–1.3)
EGFR: 39 mL/min/{1.73_m2} — AB (ref >90)
Glucose: 105 mg/dl (ref 70–140)
Potassium: 4.7 mEq/L (ref 3.5–5.1)
SODIUM: 142 meq/L (ref 136–145)
TOTAL PROTEIN: 6.9 g/dL (ref 6.4–8.3)

## 2017-01-27 LAB — CBC WITH DIFFERENTIAL/PLATELET
BASO%: 1.2 % (ref 0.0–2.0)
Basophils Absolute: 0.1 10*3/uL (ref 0.0–0.1)
EOS%: 2.4 % (ref 0.0–7.0)
Eosinophils Absolute: 0.1 10*3/uL (ref 0.0–0.5)
HEMATOCRIT: 40.9 % (ref 38.4–49.9)
HEMOGLOBIN: 14.1 g/dL (ref 13.0–17.1)
LYMPH#: 1.4 10*3/uL (ref 0.9–3.3)
LYMPH%: 28.2 % (ref 14.0–49.0)
MCH: 32.3 pg (ref 27.2–33.4)
MCHC: 34.4 g/dL (ref 32.0–36.0)
MCV: 93.9 fL (ref 79.3–98.0)
MONO#: 0.4 10*3/uL (ref 0.1–0.9)
MONO%: 8.1 % (ref 0.0–14.0)
NEUT%: 60.1 % (ref 39.0–75.0)
NEUTROS ABS: 2.9 10*3/uL (ref 1.5–6.5)
PLATELETS: 164 10*3/uL (ref 140–400)
RBC: 4.36 10*6/uL (ref 4.20–5.82)
RDW: 13.3 % (ref 11.0–14.6)
WBC: 4.9 10*3/uL (ref 4.0–10.3)

## 2017-01-27 MED ORDER — SODIUM CHLORIDE 0.9 % IJ SOLN
INTRAMUSCULAR | Status: AC
  Filled 2017-01-27: qty 50

## 2017-01-27 MED ORDER — IOPAMIDOL (ISOVUE-300) INJECTION 61%
INTRAVENOUS | Status: AC
  Administered 2017-01-27: 60 mL
  Filled 2017-01-27: qty 75

## 2017-01-29 ENCOUNTER — Ambulatory Visit (HOSPITAL_BASED_OUTPATIENT_CLINIC_OR_DEPARTMENT_OTHER): Payer: Medicare Other | Admitting: Internal Medicine

## 2017-01-29 ENCOUNTER — Encounter: Payer: Self-pay | Admitting: Internal Medicine

## 2017-01-29 ENCOUNTER — Telehealth: Payer: Self-pay | Admitting: Internal Medicine

## 2017-01-29 VITALS — BP 141/72 | HR 67 | Temp 98.0°F | Resp 18 | Ht 72.0 in | Wt 182.9 lb

## 2017-01-29 DIAGNOSIS — C7951 Secondary malignant neoplasm of bone: Secondary | ICD-10-CM | POA: Diagnosis not present

## 2017-01-29 DIAGNOSIS — G609 Hereditary and idiopathic neuropathy, unspecified: Secondary | ICD-10-CM | POA: Diagnosis not present

## 2017-01-29 DIAGNOSIS — C089 Malignant neoplasm of major salivary gland, unspecified: Secondary | ICD-10-CM

## 2017-01-29 DIAGNOSIS — C770 Secondary and unspecified malignant neoplasm of lymph nodes of head, face and neck: Secondary | ICD-10-CM

## 2017-01-29 DIAGNOSIS — C801 Malignant (primary) neoplasm, unspecified: Secondary | ICD-10-CM | POA: Diagnosis not present

## 2017-01-29 DIAGNOSIS — C799 Secondary malignant neoplasm of unspecified site: Secondary | ICD-10-CM

## 2017-01-29 NOTE — Telephone Encounter (Signed)
Appointments scheduled per 2/21 LOS. Patient given AVS report and calendars with future scheduled appointments. °

## 2017-01-29 NOTE — Progress Notes (Signed)
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 Pablo Ledger. This is expected to be completed on 05/07/2016. 2) systemic chemotherapy with carboplatin for AUC of 5 and paclitaxel 200 MG/M2 every 3 weeks. First dose 05/08/2016. Status post 4 cycles.  CURRENT THERAPY: Observation.    INTERVAL HISTORY: Jose Frank 82 y.o. male came to the clinic today for follow-up visit accompanied by his niece Lattie Haw. The patient is feeling fine today with no specific complaints except for swelling in the floor of the mouth. He was seen by his dentist and was referred to oral surgeon for evaluation. He denied having any chest pain, shortness of  breath, cough or hemoptysis. He has no fever or chills. He denied having any weight loss or night sweats. He denied having any nausea, vomiting, diarrhea or constipation. He had repeat CT scan of the neck and chest performed recently and he is here for evaluation and discussion of his scan results.  MEDICAL HISTORY: Past Medical History:  Diagnosis Date  . 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 Encounter for antineoplastic chemotherapy 05/27/2016  . History of echocardiogram    Echo 8/14:  EF 55-60%, Gr 1 DD  . Hx of cardiovascular stress test    a.  ETT-Myoview 8/14:  No scar or ischemia, EF 52%, normal study;  b. ETT-Myoview:  EF 55%, no ST changes, no ischemia; Normal   . Hyperlipidemia   . Hypertension   . IHD (ischemic heart disease)    Remote PCI to distal RCA and atherectomy of th LAD in 1996; S/P PCI to LCX and RCA in 2010.  .  Metastatic adenocarcinoma ([place]) 04/04/2016  . Renal insufficiency    on Diovan  . Submandibular lymphadenopathy 03/21/2016    ALLERGIES:  is allergic to demerol and myrbetriq [mirabegron].  MEDICATIONS:  Current Outpatient Prescriptions  Medication Sig Dispense Refill  . amLODipine (NORVASC) 5 MG tablet Take 1 tablet by mouth  daily (Patient taking differently: Take 5mg  tablet by once mouth evening) 90 tablet 3  . aspirin 81 MG tablet Take 81 mg by mouth daily.      . clopidogrel (PLAVIX) 75 MG tablet Take 1 tablet by mouth  daily (Patient taking differently: Take 74mg  tablet by mouth  once daily) 90 tablet 3  . diphenhydramine-acetaminophen (TYLENOL PM) 25-500 MG TABS tablet Take 1-2 tablets by mouth at bedtime as needed (sleep). Reported on 06/17/2016    . gabapentin (NEURONTIN) 100 MG capsule Take 1 capsule (100 mg total) by mouth 2 (two) times daily. 60 capsule 3  . GLUCOSAMINE-CHONDROITIN PO Take 1 tablet by mouth 2 (two) times daily. Reported on 06/07/2016    . metoprolol (LOPRESSOR) 50 MG tablet Take 25-50 mg by mouth 2 (two) times daily. Take 50mg  in the morning and 25mg  in the evening.    . Multiple Vitamin (MULTIVITAMIN) tablet Take 1 tablet by mouth daily.      . nitroGLYCERIN (NITROSTAT) 0.4 MG SL tablet Place 0.4 mg under the tongue every 5 (five) minutes as needed for chest pain (x 3 doses). Reported on 06/17/2016    .  NONFORMULARY OR COMPOUNDED ITEM [facility]:  Peripheral Neuropathy Cream-Bupivacaine 1%, Doxepin 3%, Gabapentin 6%, Pentoxifylline 3%, Topiramate 1%, apply1-2 grams to affected area 3-4 time daily. 120 each 2  . oxyCODONE-acetaminophen (PERCOCET/ROXICET) 5-325 MG tablet Take 1 tablet by mouth every 6 (six) hours as needed for severe pain. 30 tablet 0  . prochlorperazine (COMPAZINE) 10 MG tablet Take 1 tablet (10 mg total) by mouth every 6 (six) hours as needed for nausea or vomiting. 30 tablet 0  . rosuvastatin (CRESTOR) 10 MG tablet Take 10 mg by mouth every  evening.     . tamsulosin (FLOMAX) 0.4 MG CAPS capsule Take 0.4 mg by mouth every evening.   11   No current facility-administered medications for this visit.     SURGICAL HISTORY:  Past Surgical History:  Procedure Laterality Date  . CARDIAC CATHETERIZATION  09/13/2009  . CARDIAC CATHETERIZATION  09/04/2009   EF 60%  . CARDIOVASCULAR STRESS TEST  12/08/2007   EF 57%  . PCI  08/2009   TO THE LCX AND RCA  . TONSILLECTOMY      REVIEW OF SYSTEMS:  Constitutional: negative Eyes: negative Ears, nose, mouth, throat, and face: positive for sore mouth Respiratory: negative Cardiovascular: negative Gastrointestinal: negative Genitourinary:negative Integument/breast: negative Hematologic/lymphatic: negative Musculoskeletal:negative Neurological: negative Behavioral/Psych: negative Endocrine: negative Allergic/Immunologic: negative   PHYSICAL EXAMINATION: General appearance: alert, cooperative and no distress Head: Normocephalic, without obvious abnormality, atraumatic, Enlarged submandibular gland Neck: no carotid bruit, no JVD, supple, symmetrical, trachea midline, thyroid not enlarged, symmetric, no tenderness/mass/nodules and large submandibular mass Lymph nodes: large submandibular mass Resp: clear to auscultation bilaterally Back: symmetric, no curvature. ROM normal. No CVA tenderness. Cardio: regular rate and rhythm, S1, S2 normal, no murmur, click, rub or gallop GI: soft, non-tender; bowel sounds normal; no masses,  no organomegaly Extremities: extremities normal, atraumatic, no cyanosis or edema Neurologic: Alert and oriented X 3, normal strength and tone. Normal symmetric reflexes. Normal coordination and gait  ECOG PERFORMANCE STATUS: 1 - Symptomatic but completely ambulatory  Blood pressure (!) 141/72, pulse 67, temperature 98 F (36.7 C), temperature source Oral, resp. rate 18, height 6' (1.829 m), weight 182 lb 14.4 oz (83 kg), SpO2 98 %.  LABORATORY DATA: Lab  Results  Component Value Date   WBC 4.9 01/27/2017   HGB 14.1 01/27/2017   HCT 40.9 01/27/2017   MCV 93.9 01/27/2017   PLT 164 01/27/2017      Chemistry      Component Value Date/Time   NA 142 01/27/2017 0940   K 4.7 01/27/2017 0940   CL 106 08/28/2016 1230   CO2 24 01/27/2017 0940   BUN 21.2 01/27/2017 0940   CREATININE 1.6 (H) 01/27/2017 0940      Component Value Date/Time   CALCIUM 9.7 01/27/2017 0940   ALKPHOS 90 01/27/2017 0940   AST 18 01/27/2017 0940   ALT 17 01/27/2017 0940   BILITOT 0.61 01/27/2017 0940       RADIOGRAPHIC STUDIES: Ct Soft Tissue Neck W Contrast  Result Date: 01/27/2017 CLINICAL DATA:  Mucoepidermoid carcinoma salivary gland. Post chemo and radiation. Restaging EXAM: CT NECK WITH CONTRAST TECHNIQUE: Multidetector CT imaging of the neck was performed using the standard protocol following the bolus administration of intravenous contrast. CONTRAST:  80mL ISOVUE-300 IOPAMIDOL (ISOVUE-300) INJECTION 61% COMPARISON:  CT neck 10/25/2016, 08/28/2016, 09/05/2016 PET 03/20/2016 FINDINGS: Pharynx and larynx: Normal. No mass or swelling. Salivary glands: Enlarged right submandibular gland measuring 30 x 26 mm. This shows progressive enlargement from prior studies.  Most recently this measured 25 x 24 mm. Fairly homogeneous enhancement of the mass. There is also progressive enlargement of the submandibular gland on the right. Left submandibular gland is either hypoplastic or surgically absent. There appears to be a surgical defect in the left neck. Parotid gland normal bilaterally. Thyroid: Negative Lymph nodes: No pathologic lymph nodes in the neck. Vascular: Atherosclerotic calcification of the carotid bifurcation bilaterally. Carotid artery and jugular vein patent bilaterally. Limited intracranial: Negative Visualized orbits: Negative Mastoids and visualized paranasal sinuses: Negative Skeleton: Cervical disc degeneration. No acute skeletal abnormality. Upper chest:  Chest CT from today reported separately Other: None IMPRESSION: Progressive enlargement of right submandibular gland and right sub lingual gland compared to prior study. Given the history, this is most left most likely due to active growth of tumor. No malignant adenopathy. Electronically Signed   By: Franchot Gallo M.D.   On: 01/27/2017 13:10   Ct Chest W Contrast  Result Date: 01/27/2017 CLINICAL DATA:  Metastatic adenocarcinoma of salivary gland. Previous chemotherapy and radiation therapy. EXAM: CT CHEST WITH CONTRAST TECHNIQUE: Multidetector CT imaging of the chest was performed during intravenous contrast administration. CONTRAST:  73mL ISOVUE-300 IOPAMIDOL (ISOVUE-300) INJECTION 61% COMPARISON:  10/25/2016 FINDINGS: Cardiovascular: No acute findings. Aortic and coronary artery atherosclerosis. Mediastinum/Nodes: No masses or pathologically enlarged lymph nodes identified. Stable sub-cm right thyroid lobe nodule. Lungs/Pleura: Stable post radiation changes in left lower lobe. Stable 6 mm pulmonary nodule in posterior right lower lobe on image 123/5. No evidence pleural effusion. Upper Abdomen:  Unremarkable. Musculoskeletal: Stable lytic bone lesion involving left posterior seventh rib. No other suspicious bone lesions identified. IMPRESSION: Stable appearance of bone metastasis in the left posterior seventh rib. Stable adjacent radiation changes in left lower lobe. Stable 6 mm posterior right lower lobe pulmonary nodule. No new or progressive metastatic disease within the thorax. Electronically Signed   By: Earle Gell M.D.   On: 01/27/2017 14:36    ASSESSMENT AND PLAN:  This is a very pleasant 82 years old white male with metastatic adenocarcinoma questionable for mucoepidermoid carcinoma involving the submandibular gland as well as he left seventh rib bone lesions. He underwent palliative radiotherapy to the left seventh rib. He also underwent systemic chemotherapy with carboplatin and paclitaxel  for 4 cycles. This was discontinued secondary to peripheral neuropathy. The patient continues to have residual peripheral neuropathy from his previous treatment. The recent CT scan of the neck and chest showed no evidence for disease progression of the chest but there was enlargement of the submandibular gland lesion. This may explain the abnormality seen in the floor of the mouth by his dentist. I recommended for the patient to see surgery for evaluation of his condition and to see if this can be resected. For the peripheral neuropathy, I recommended for him to continue on gabapentin 100 mg by mouth a.m. and 200 mg by mouth at nighttime because of increased neuropathy at that time. I will see him back for follow-up visit in 3 months for reevaluation with repeat CT scan of the neck and chest. He was advised to call immediately if he has any concerning symptoms in the interval. The patient voices understanding of current disease status and treatment options and is in agreement with the current care plan.  All questions were answered. The patient knows to call the clinic with any problems, questions or concerns. We can certainly see the patient much sooner if necessary.  Disclaimer: This note was dictated with voice recognition software. Similar sounding words  can inadvertently be transcribed and may not be corrected upon review.

## 2017-02-05 ENCOUNTER — Telehealth: Payer: Self-pay | Admitting: *Deleted

## 2017-02-05 IMAGING — MR MR HEAD WO/W CM
1 series · 19 of 28 positions shown · IV contrast (multihance)
Comparison: Head CT 11/09/2014

CLINICAL DATA: Metastatic adenocarcinoma of unknown primary
involving the right submandibular gland and left seventh rib
diagnosed [DATE]. Staging.

EXAM:
MRI HEAD WITHOUT AND WITH CONTRAST
TECHNIQUE: Multiplanar, multiecho pulse sequences of the brain and surrounding
structures were obtained without and with intravenous contrast.
CONTRAST:  8mL MULTIHANCE GADOBENATE DIMEGLUMINE 529 MG/ML IV SOLN

[Series 8: FLAIR · axial · 5.0mm · 0.43mm/px · z∈[-76,+105]mm · 19 of 28 slices shown]
[im 1/28]
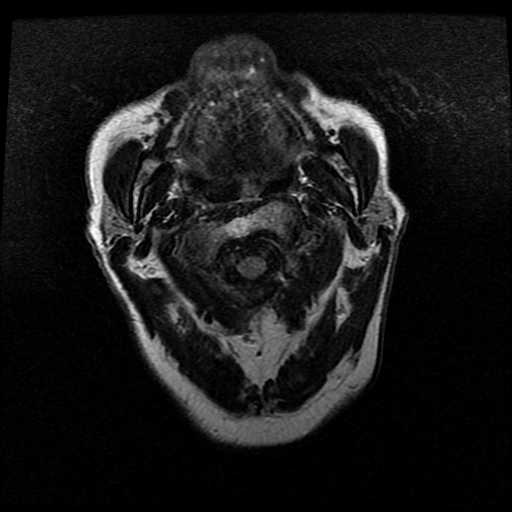
[im 2/28]
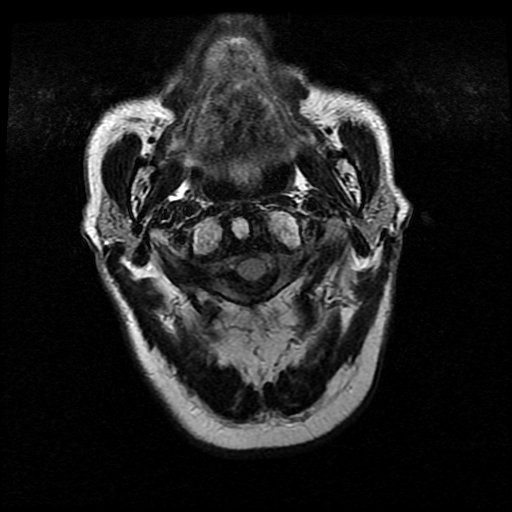
[im 3/28]
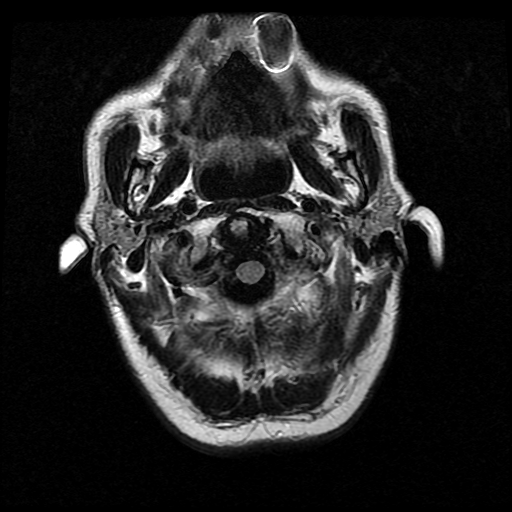
[im 4/28]
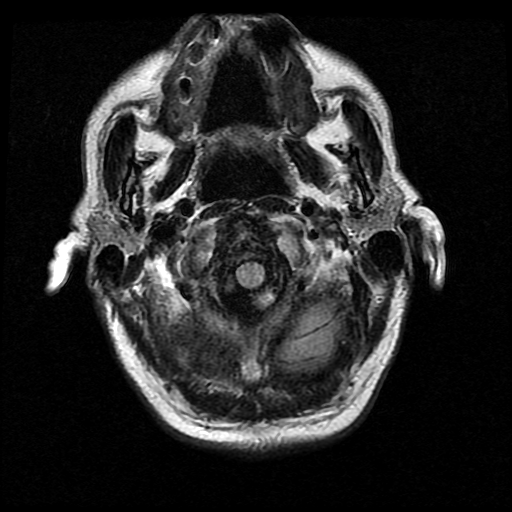
[im 5/28]
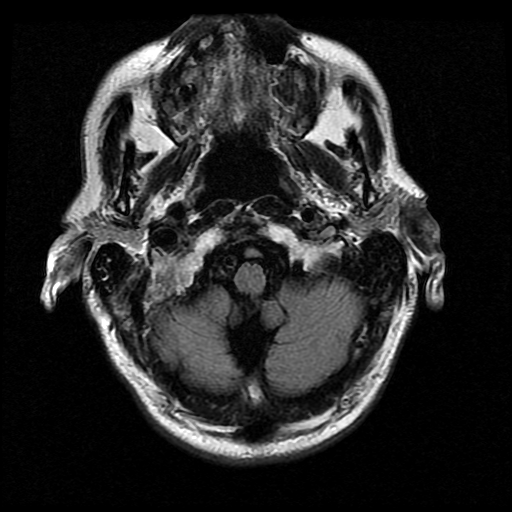
[im 6/28]
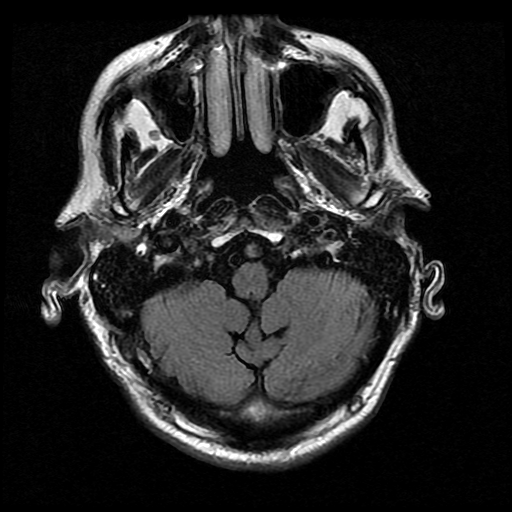
[im 7/28]
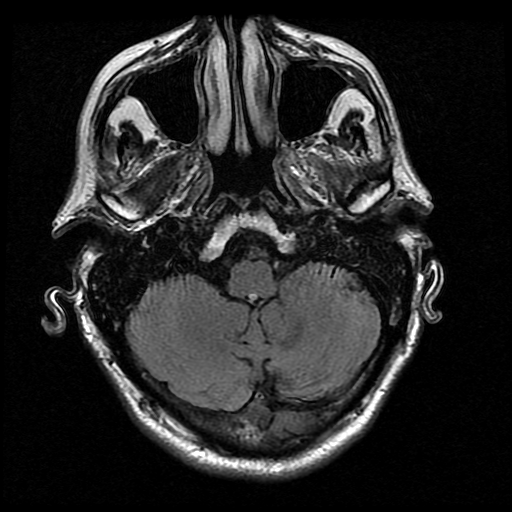
[im 8/28]
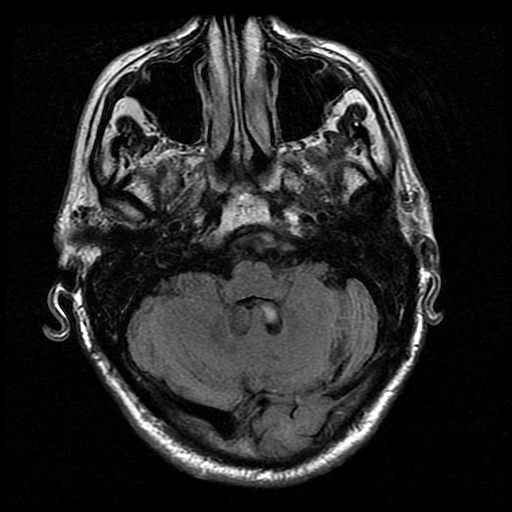
[im 9/28]
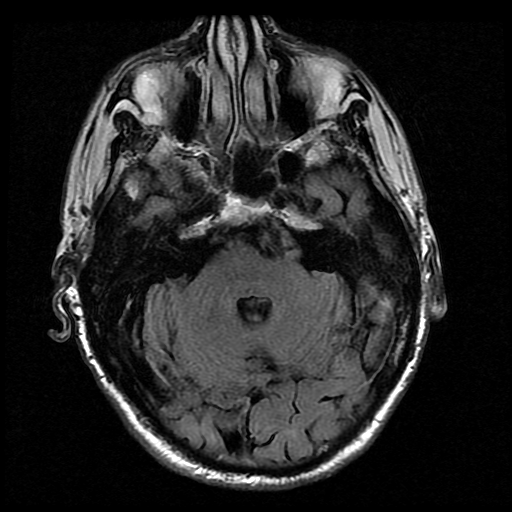
[im 10/28]
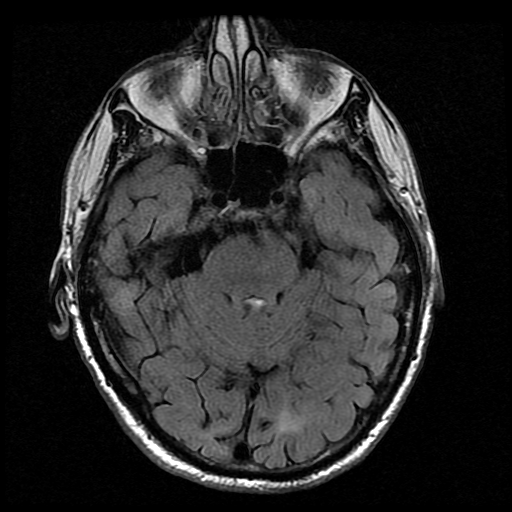
[im 11/28]
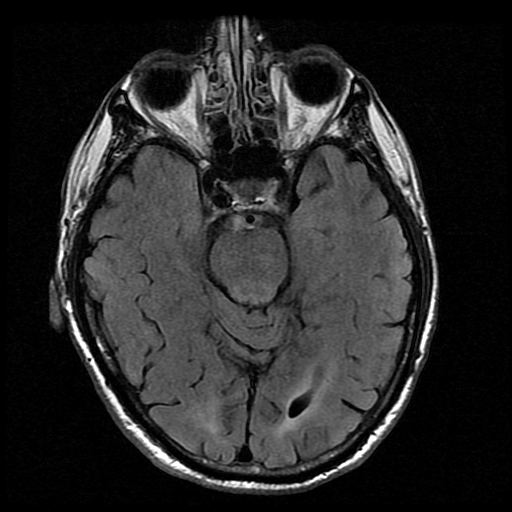
[im 12/28]
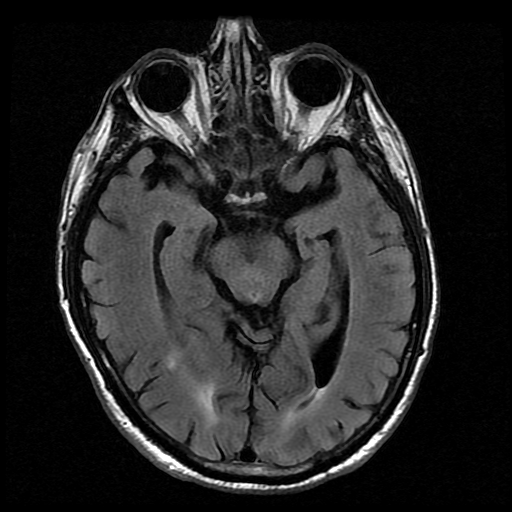
[im 13/28]
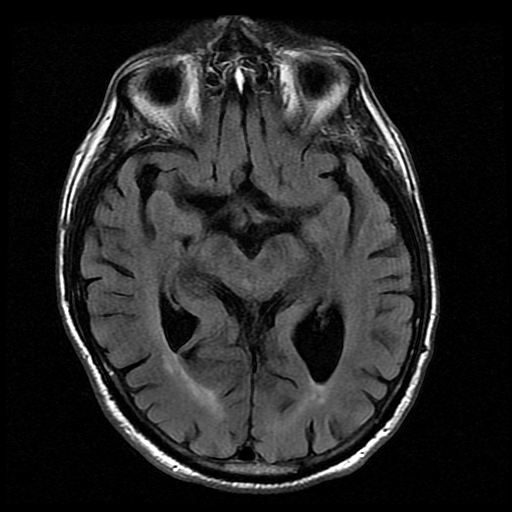
[im 15/28]
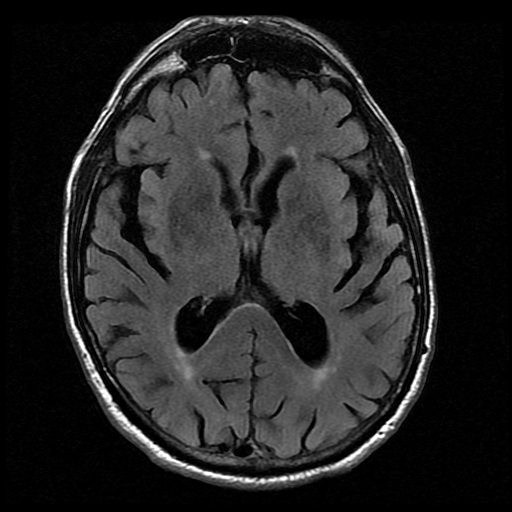
[im 16/28]
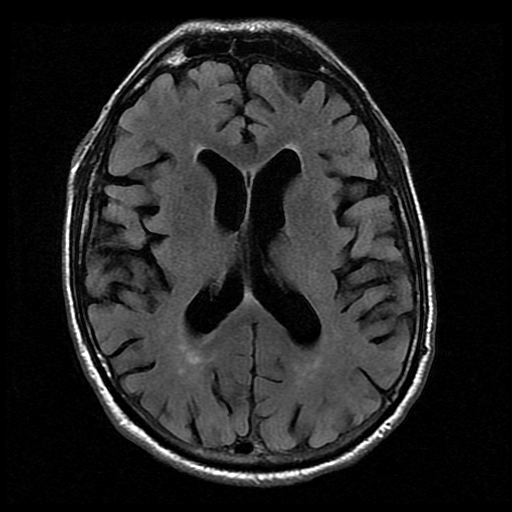
[im 20/28]
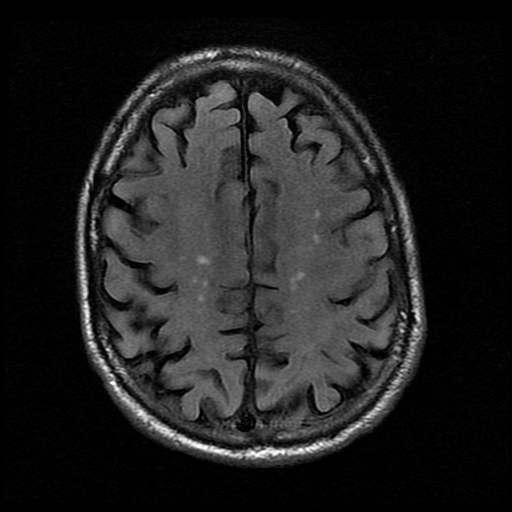
[im 23/28]
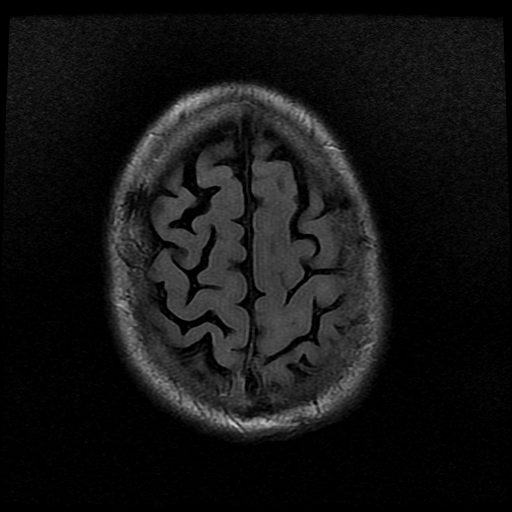
[im 24/28]
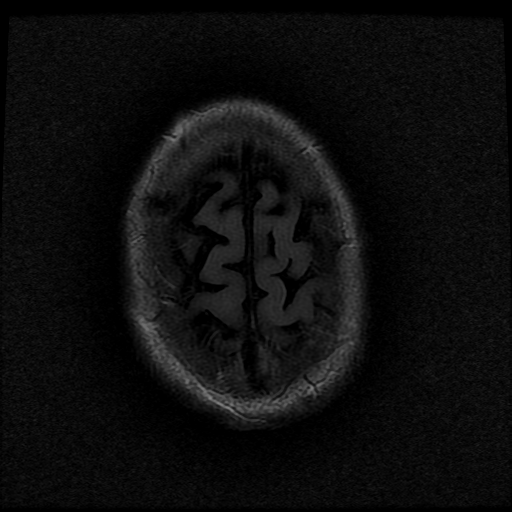
[im 27/28]
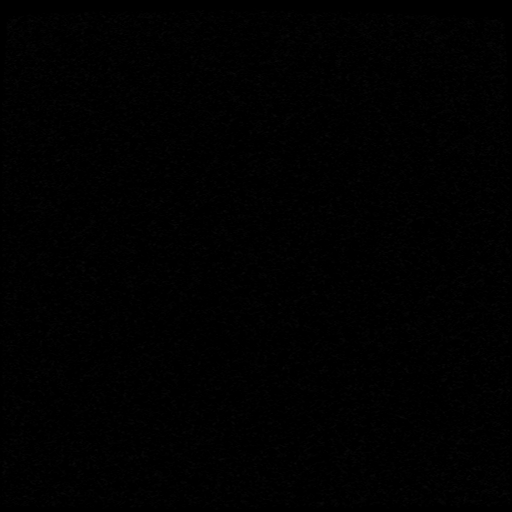

[19 of 28 positions shown; findings below may reference images not displayed]

FINDINGS: There is no evidence of acute infarct, intracranial hemorrhage,
mass, midline shift, or extra-axial fluid collection. There is mild
cerebral atrophy. Small foci of T2 hyperintensity in the cerebral
white matter bilaterally are nonspecific but compatible with mild
chronic small vessel ischemic disease. No abnormal brain parenchymal
or meningeal enhancement is identified.

Masslike soft tissue in the right submandibular region is partially
visualized as described on prior PET-CT. There is atherosclerotic
disease of the distal left vertebral artery. Major intracranial
vascular flow voids are otherwise preserved. The no significant
paranasal sinus or mastoid air cell inflammatory change is seen. No
suspicious skull lesion is identified.
IMPRESSION: 1. No evidence of intracranial metastases.
2. Mild chronic small vessel ischemic disease.

## 2017-02-05 NOTE — Telephone Encounter (Signed)
"  I've called [facility] to get an appointment with Dr. Harlow Asa.  They said they cannot do anything without a referral from Dr. Julien Nordmann.  Would you get a message to him to fax or call a referral to [facility]?  Thanks."

## 2017-02-05 NOTE — Telephone Encounter (Signed)
They are not on EPIC no can see referral - message sent to med records.

## 2017-02-06 ENCOUNTER — Telehealth: Payer: Self-pay | Admitting: Internal Medicine

## 2017-02-06 NOTE — Telephone Encounter (Signed)
Faxed records to Dr. Gerkin °

## 2017-02-10 ENCOUNTER — Telehealth: Payer: Self-pay | Admitting: *Deleted

## 2017-02-10 ENCOUNTER — Telehealth: Payer: Self-pay | Admitting: Medical Oncology

## 2017-02-10 ENCOUNTER — Other Ambulatory Visit: Payer: Self-pay | Admitting: Medical Oncology

## 2017-02-10 ENCOUNTER — Encounter: Payer: Self-pay | Admitting: *Deleted

## 2017-02-10 DIAGNOSIS — C089 Malignant neoplasm of major salivary gland, unspecified: Secondary | ICD-10-CM

## 2017-02-10 DIAGNOSIS — C799 Secondary malignant neoplasm of unspecified site: Secondary | ICD-10-CM

## 2017-02-10 NOTE — Telephone Encounter (Signed)
Wife calling to say patient has not been called for referral to dr gerkin's office. Per kim in medical records, a fax was rec'd today from dr gerkin. He states patient needs to be referred to an ENT. Per diane, dr Worthy Flank nurse, they are making a referral to an ENT today. Wife notified.

## 2017-02-10 NOTE — Telephone Encounter (Signed)
Faxed urgent referral to ENT -Dr Harlow Asa recommended this. Records faxed .

## 2017-02-13 ENCOUNTER — Telehealth: Payer: Self-pay | Admitting: Medical Oncology

## 2017-02-13 NOTE — Telephone Encounter (Signed)
I left message re ENT appt.

## 2017-02-14 DIAGNOSIS — C080 Malignant neoplasm of submandibular gland: Secondary | ICD-10-CM | POA: Diagnosis not present

## 2017-02-14 DIAGNOSIS — C040 Malignant neoplasm of anterior floor of mouth: Secondary | ICD-10-CM | POA: Diagnosis not present

## 2017-02-20 ENCOUNTER — Telehealth: Payer: Self-pay

## 2017-02-20 ENCOUNTER — Telehealth: Payer: Self-pay | Admitting: Medical Oncology

## 2017-02-20 NOTE — Telephone Encounter (Signed)
Dr Redmond Baseman office called back and received records , Dr Redmond Baseman was looking for op note , but it was not a problem.

## 2017-02-20 NOTE — Telephone Encounter (Signed)
Returned call to wife after she said ENT did not have records. ( PT records faxed to and received by ENT on 02/10/17)  I called wife and no answer. I called ENT Dr Redmond Baseman nurse to return my call re records.

## 2017-02-20 NOTE — Telephone Encounter (Signed)
Jose Frank called that [name] was referred to Dr Redmond Baseman ENT. When they had the appt Dr Redmond Baseman had not received records from [facility] and spent most of the appt looking for records. Dr Redmond Baseman referred pt to Dr Conley Canal at [facility] 3/21. She wants to make sure records are sent to Dr Conley Canal. Reports/scans etc. She does not want to waste time and have the embarrasment of Dr Conley Canal searching for records.  Please call wife to confirm the records are sent.  Routed to Dr Cam Hai and Charlie Pitter.

## 2017-02-22 ENCOUNTER — Other Ambulatory Visit: Payer: Self-pay | Admitting: Internal Medicine

## 2017-02-26 DIAGNOSIS — C7951 Secondary malignant neoplasm of bone: Secondary | ICD-10-CM | POA: Diagnosis not present

## 2017-02-26 DIAGNOSIS — C080 Malignant neoplasm of submandibular gland: Secondary | ICD-10-CM | POA: Diagnosis not present

## 2017-02-26 DIAGNOSIS — Z9221 Personal history of antineoplastic chemotherapy: Secondary | ICD-10-CM | POA: Diagnosis not present

## 2017-02-26 DIAGNOSIS — Z85818 Personal history of malignant neoplasm of other sites of lip, oral cavity, and pharynx: Secondary | ICD-10-CM | POA: Diagnosis not present

## 2017-02-26 DIAGNOSIS — E7800 Pure hypercholesterolemia, unspecified: Secondary | ICD-10-CM | POA: Diagnosis not present

## 2017-02-26 DIAGNOSIS — Z923 Personal history of irradiation: Secondary | ICD-10-CM | POA: Diagnosis not present

## 2017-02-26 DIAGNOSIS — Z79899 Other long term (current) drug therapy: Secondary | ICD-10-CM | POA: Diagnosis not present

## 2017-02-26 DIAGNOSIS — Z885 Allergy status to narcotic agent status: Secondary | ICD-10-CM | POA: Diagnosis not present

## 2017-02-27 ENCOUNTER — Telehealth: Payer: Self-pay | Admitting: Internal Medicine

## 2017-02-27 NOTE — Telephone Encounter (Signed)
Wife called to cancel lab and MD appts. Michela Pitcher he husband is going to see another provider referred by Dr. Julien Nordmann.

## 2017-03-05 ENCOUNTER — Telehealth: Payer: Self-pay | Admitting: Cardiovascular Disease

## 2017-03-05 NOTE — Telephone Encounter (Signed)
New Message   Request for surgical clearance:  1. What type of surgery is being performed? Biopsy  2. When is this surgery scheduled? 03-17-17  3. Are there any medications that need to be held prior to surgery and how long? Clear to come off Plavix  4. Name of physician performing surgery? Dr. Fredricka Bonine  5. What is your office phone and fax number? [phone]/[facility]: [phone]

## 2017-03-05 NOTE — Telephone Encounter (Signed)
Pt is at low-risk of holding plavix x 5 days for biopsy. Resume plavix when able after biopsy.

## 2017-03-05 NOTE — Telephone Encounter (Signed)
Returned call to [facility] with [facility] ENT, Dr.Sullivan's office.Patient is scheduled for a biopsy in office and they need to know by this Friday 3/30 if ok and how long to hold plavix.Advised Dr.Cooper out of office this week.I will send message to him for advice.

## 2017-03-06 NOTE — Telephone Encounter (Signed)
Dr.Cooper's note faxed to [place] office at fax # [phone].

## 2017-03-14 DIAGNOSIS — C080 Malignant neoplasm of submandibular gland: Secondary | ICD-10-CM | POA: Diagnosis not present

## 2017-03-14 DIAGNOSIS — R918 Other nonspecific abnormal finding of lung field: Secondary | ICD-10-CM | POA: Diagnosis not present

## 2017-03-14 DIAGNOSIS — R911 Solitary pulmonary nodule: Secondary | ICD-10-CM | POA: Diagnosis not present

## 2017-03-17 ENCOUNTER — Ambulatory Visit: Payer: Medicare Other | Admitting: Podiatry

## 2017-03-17 DIAGNOSIS — Z9221 Personal history of antineoplastic chemotherapy: Secondary | ICD-10-CM | POA: Diagnosis not present

## 2017-03-17 DIAGNOSIS — E7800 Pure hypercholesterolemia, unspecified: Secondary | ICD-10-CM | POA: Diagnosis not present

## 2017-03-17 DIAGNOSIS — R911 Solitary pulmonary nodule: Secondary | ICD-10-CM | POA: Diagnosis not present

## 2017-03-17 DIAGNOSIS — Z923 Personal history of irradiation: Secondary | ICD-10-CM | POA: Diagnosis not present

## 2017-03-17 DIAGNOSIS — Z85818 Personal history of malignant neoplasm of other sites of lip, oral cavity, and pharynx: Secondary | ICD-10-CM | POA: Diagnosis not present

## 2017-03-17 DIAGNOSIS — Z79899 Other long term (current) drug therapy: Secondary | ICD-10-CM | POA: Diagnosis not present

## 2017-03-17 DIAGNOSIS — C080 Malignant neoplasm of submandibular gland: Secondary | ICD-10-CM | POA: Diagnosis not present

## 2017-03-17 DIAGNOSIS — Z888 Allergy status to other drugs, medicaments and biological substances status: Secondary | ICD-10-CM | POA: Diagnosis not present

## 2017-03-17 DIAGNOSIS — K1379 Other lesions of oral mucosa: Secondary | ICD-10-CM | POA: Diagnosis not present

## 2017-03-17 DIAGNOSIS — C7951 Secondary malignant neoplasm of bone: Secondary | ICD-10-CM | POA: Diagnosis not present

## 2017-03-17 DIAGNOSIS — C049 Malignant neoplasm of floor of mouth, unspecified: Secondary | ICD-10-CM | POA: Diagnosis not present

## 2017-03-20 DIAGNOSIS — C040 Malignant neoplasm of anterior floor of mouth: Secondary | ICD-10-CM | POA: Diagnosis not present

## 2017-03-20 DIAGNOSIS — I2510 Atherosclerotic heart disease of native coronary artery without angina pectoris: Secondary | ICD-10-CM | POA: Diagnosis not present

## 2017-03-23 ENCOUNTER — Other Ambulatory Visit: Payer: Self-pay | Admitting: Internal Medicine

## 2017-03-24 ENCOUNTER — Encounter: Payer: Self-pay | Admitting: Podiatry

## 2017-03-24 ENCOUNTER — Ambulatory Visit (INDEPENDENT_AMBULATORY_CARE_PROVIDER_SITE_OTHER): Payer: Medicare Other | Admitting: Podiatry

## 2017-03-24 DIAGNOSIS — M79674 Pain in right toe(s): Secondary | ICD-10-CM

## 2017-03-24 DIAGNOSIS — M79675 Pain in left toe(s): Secondary | ICD-10-CM | POA: Diagnosis not present

## 2017-03-24 DIAGNOSIS — B351 Tinea unguium: Secondary | ICD-10-CM

## 2017-03-24 DIAGNOSIS — G629 Polyneuropathy, unspecified: Secondary | ICD-10-CM

## 2017-03-25 NOTE — Progress Notes (Signed)
Subjective: 82 y.o. returns the office today for painful, elongated, thickened toenails which he cannot trim himself. Denies any redness or drainage around the nails. Since last appointment he has been started on gabapentin 100 mg twice a day which is no longer helping. He states he is scheduled for upcoming surgery this Friday and does not want to change any medications right now.  Denies any acute changes since last appointment and no new complaints today. Denies any systemic complaints such as fevers, chills, nausea, vomiting.   Objective: AAO 3, NAD DP/PT pulses palpable, CRT less than 3 seconds Protective sensation decreased with Simms Weinstein monofilament, Achilles tendon reflex intact.  Nails hypertrophic, dystrophic, elongated, brittle, discolored 10. There is tenderness overlying the nails 1-5 bilaterally. There is no surrounding erythema or drainage along the nail sites. No open lesions or pre-ulcerative lesions are identified. No other areas of tenderness bilateral lower extremities. No overlying edema, erythema, increased warmth. No pain with calf compression, swelling, warmth, erythema.  Assessment: Patient presents with symptomatic onychomycosis, neuropathy   Plan: -Treatment options including alternatives, risks, complications were discussed -Nails sharply debrided 10 without complication/bleeding. -Discussed increasing the dose of gabapentin but since he has upcoming surgery this wekk and will be admitted we will hold on changing the dose right now. After we can consider increasing the dose of gabapentin if needed.  -Discussed daily foot inspection. If there are any changes, to call the office immediately.  -Follow-up in 3 months or sooner if any problems are to arise. In the meantime, encouraged to call the office with any questions, concerns, changes symptoms.  Celesta Gentile, DPM

## 2017-03-28 DIAGNOSIS — Z9221 Personal history of antineoplastic chemotherapy: Secondary | ICD-10-CM | POA: Diagnosis not present

## 2017-03-28 DIAGNOSIS — I2510 Atherosclerotic heart disease of native coronary artery without angina pectoris: Secondary | ICD-10-CM | POA: Diagnosis not present

## 2017-03-28 DIAGNOSIS — C048 Malignant neoplasm of overlapping sites of floor of mouth: Secondary | ICD-10-CM | POA: Diagnosis not present

## 2017-03-28 DIAGNOSIS — R739 Hyperglycemia, unspecified: Secondary | ICD-10-CM | POA: Diagnosis not present

## 2017-03-28 DIAGNOSIS — Z930 Tracheostomy status: Secondary | ICD-10-CM | POA: Diagnosis not present

## 2017-03-28 DIAGNOSIS — N400 Enlarged prostate without lower urinary tract symptoms: Secondary | ICD-10-CM | POA: Diagnosis not present

## 2017-03-28 DIAGNOSIS — Z7982 Long term (current) use of aspirin: Secondary | ICD-10-CM | POA: Diagnosis not present

## 2017-03-28 DIAGNOSIS — C080 Malignant neoplasm of submandibular gland: Secondary | ICD-10-CM | POA: Diagnosis not present

## 2017-03-28 DIAGNOSIS — C089 Malignant neoplasm of major salivary gland, unspecified: Secondary | ICD-10-CM | POA: Diagnosis not present

## 2017-03-28 DIAGNOSIS — I493 Ventricular premature depolarization: Secondary | ICD-10-CM | POA: Diagnosis not present

## 2017-03-28 DIAGNOSIS — I10 Essential (primary) hypertension: Secondary | ICD-10-CM | POA: Diagnosis not present

## 2017-03-28 DIAGNOSIS — I129 Hypertensive chronic kidney disease with stage 1 through stage 4 chronic kidney disease, or unspecified chronic kidney disease: Secondary | ICD-10-CM | POA: Diagnosis present

## 2017-03-28 DIAGNOSIS — S3739XA Other injury of urethra, initial encounter: Secondary | ICD-10-CM | POA: Diagnosis not present

## 2017-03-28 DIAGNOSIS — R0689 Other abnormalities of breathing: Secondary | ICD-10-CM | POA: Diagnosis not present

## 2017-03-28 DIAGNOSIS — Z85818 Personal history of malignant neoplasm of other sites of lip, oral cavity, and pharynx: Secondary | ICD-10-CM | POA: Diagnosis not present

## 2017-03-28 DIAGNOSIS — D638 Anemia in other chronic diseases classified elsewhere: Secondary | ICD-10-CM | POA: Diagnosis not present

## 2017-03-28 DIAGNOSIS — R000 Tachycardia, unspecified: Secondary | ICD-10-CM | POA: Diagnosis not present

## 2017-03-28 DIAGNOSIS — C049 Malignant neoplasm of floor of mouth, unspecified: Secondary | ICD-10-CM | POA: Diagnosis not present

## 2017-03-28 DIAGNOSIS — N401 Enlarged prostate with lower urinary tract symptoms: Secondary | ICD-10-CM | POA: Diagnosis present

## 2017-03-28 DIAGNOSIS — Z955 Presence of coronary angioplasty implant and graft: Secondary | ICD-10-CM | POA: Diagnosis not present

## 2017-03-28 DIAGNOSIS — E46 Unspecified protein-calorie malnutrition: Secondary | ICD-10-CM | POA: Diagnosis not present

## 2017-03-28 DIAGNOSIS — R338 Other retention of urine: Secondary | ICD-10-CM | POA: Diagnosis not present

## 2017-03-28 DIAGNOSIS — R590 Localized enlarged lymph nodes: Secondary | ICD-10-CM | POA: Diagnosis not present

## 2017-03-28 DIAGNOSIS — N183 Chronic kidney disease, stage 3 (moderate): Secondary | ICD-10-CM | POA: Diagnosis present

## 2017-03-28 DIAGNOSIS — Z923 Personal history of irradiation: Secondary | ICD-10-CM | POA: Diagnosis not present

## 2017-03-28 DIAGNOSIS — C7989 Secondary malignant neoplasm of other specified sites: Secondary | ICD-10-CM | POA: Diagnosis not present

## 2017-03-28 DIAGNOSIS — Z4659 Encounter for fitting and adjustment of other gastrointestinal appliance and device: Secondary | ICD-10-CM | POA: Diagnosis not present

## 2017-03-28 HISTORY — PX: OTHER SURGICAL HISTORY: SHX169

## 2017-03-28 HISTORY — PX: FREE FLAP RADIAL FOREARM: SHX1678

## 2017-03-28 HISTORY — PX: TRACHEOSTOMY: SUR1362

## 2017-03-28 HISTORY — PX: NECK DISSECTION: SUR422

## 2017-04-06 DIAGNOSIS — H9190 Unspecified hearing loss, unspecified ear: Secondary | ICD-10-CM | POA: Diagnosis not present

## 2017-04-06 DIAGNOSIS — C040 Malignant neoplasm of anterior floor of mouth: Secondary | ICD-10-CM | POA: Diagnosis not present

## 2017-04-06 DIAGNOSIS — Z930 Tracheostomy status: Secondary | ICD-10-CM | POA: Diagnosis not present

## 2017-04-06 DIAGNOSIS — N400 Enlarged prostate without lower urinary tract symptoms: Secondary | ICD-10-CM | POA: Diagnosis not present

## 2017-04-06 DIAGNOSIS — G629 Polyneuropathy, unspecified: Secondary | ICD-10-CM | POA: Diagnosis not present

## 2017-04-06 DIAGNOSIS — N183 Chronic kidney disease, stage 3 (moderate): Secondary | ICD-10-CM | POA: Diagnosis not present

## 2017-04-06 DIAGNOSIS — Z95818 Presence of other cardiac implants and grafts: Secondary | ICD-10-CM | POA: Diagnosis not present

## 2017-04-06 DIAGNOSIS — I2510 Atherosclerotic heart disease of native coronary artery without angina pectoris: Secondary | ICD-10-CM | POA: Diagnosis not present

## 2017-04-06 DIAGNOSIS — I129 Hypertensive chronic kidney disease with stage 1 through stage 4 chronic kidney disease, or unspecified chronic kidney disease: Secondary | ICD-10-CM | POA: Diagnosis not present

## 2017-04-06 DIAGNOSIS — Z434 Encounter for attention to other artificial openings of digestive tract: Secondary | ICD-10-CM | POA: Diagnosis not present

## 2017-04-06 DIAGNOSIS — E7800 Pure hypercholesterolemia, unspecified: Secondary | ICD-10-CM | POA: Diagnosis not present

## 2017-04-06 DIAGNOSIS — Z483 Aftercare following surgery for neoplasm: Secondary | ICD-10-CM | POA: Diagnosis not present

## 2017-04-09 DIAGNOSIS — E7800 Pure hypercholesterolemia, unspecified: Secondary | ICD-10-CM | POA: Diagnosis not present

## 2017-04-09 DIAGNOSIS — C040 Malignant neoplasm of anterior floor of mouth: Secondary | ICD-10-CM | POA: Diagnosis not present

## 2017-04-09 DIAGNOSIS — Z888 Allergy status to other drugs, medicaments and biological substances status: Secondary | ICD-10-CM | POA: Diagnosis not present

## 2017-04-09 DIAGNOSIS — Z9889 Other specified postprocedural states: Secondary | ICD-10-CM | POA: Diagnosis not present

## 2017-04-09 DIAGNOSIS — R1312 Dysphagia, oropharyngeal phase: Secondary | ICD-10-CM | POA: Diagnosis not present

## 2017-04-09 DIAGNOSIS — Z885 Allergy status to narcotic agent status: Secondary | ICD-10-CM | POA: Diagnosis not present

## 2017-04-09 DIAGNOSIS — Z978 Presence of other specified devices: Secondary | ICD-10-CM | POA: Diagnosis not present

## 2017-04-09 DIAGNOSIS — Z79899 Other long term (current) drug therapy: Secondary | ICD-10-CM | POA: Diagnosis not present

## 2017-04-15 DIAGNOSIS — I2510 Atherosclerotic heart disease of native coronary artery without angina pectoris: Secondary | ICD-10-CM | POA: Diagnosis not present

## 2017-04-15 DIAGNOSIS — C040 Malignant neoplasm of anterior floor of mouth: Secondary | ICD-10-CM | POA: Diagnosis not present

## 2017-04-15 DIAGNOSIS — N183 Chronic kidney disease, stage 3 (moderate): Secondary | ICD-10-CM | POA: Diagnosis not present

## 2017-04-15 DIAGNOSIS — Z483 Aftercare following surgery for neoplasm: Secondary | ICD-10-CM | POA: Diagnosis not present

## 2017-04-15 DIAGNOSIS — I129 Hypertensive chronic kidney disease with stage 1 through stage 4 chronic kidney disease, or unspecified chronic kidney disease: Secondary | ICD-10-CM | POA: Diagnosis not present

## 2017-04-15 DIAGNOSIS — Z434 Encounter for attention to other artificial openings of digestive tract: Secondary | ICD-10-CM | POA: Diagnosis not present

## 2017-04-17 DIAGNOSIS — C040 Malignant neoplasm of anterior floor of mouth: Secondary | ICD-10-CM | POA: Diagnosis not present

## 2017-04-17 DIAGNOSIS — I129 Hypertensive chronic kidney disease with stage 1 through stage 4 chronic kidney disease, or unspecified chronic kidney disease: Secondary | ICD-10-CM | POA: Diagnosis not present

## 2017-04-17 DIAGNOSIS — Z483 Aftercare following surgery for neoplasm: Secondary | ICD-10-CM | POA: Diagnosis not present

## 2017-04-17 DIAGNOSIS — N183 Chronic kidney disease, stage 3 (moderate): Secondary | ICD-10-CM | POA: Diagnosis not present

## 2017-04-17 DIAGNOSIS — I2510 Atherosclerotic heart disease of native coronary artery without angina pectoris: Secondary | ICD-10-CM | POA: Diagnosis not present

## 2017-04-17 DIAGNOSIS — Z434 Encounter for attention to other artificial openings of digestive tract: Secondary | ICD-10-CM | POA: Diagnosis not present

## 2017-04-22 ENCOUNTER — Other Ambulatory Visit: Payer: Self-pay | Admitting: Internal Medicine

## 2017-04-23 DIAGNOSIS — Z85818 Personal history of malignant neoplasm of other sites of lip, oral cavity, and pharynx: Secondary | ICD-10-CM | POA: Diagnosis not present

## 2017-04-23 DIAGNOSIS — Z885 Allergy status to narcotic agent status: Secondary | ICD-10-CM | POA: Diagnosis not present

## 2017-04-23 DIAGNOSIS — Z9889 Other specified postprocedural states: Secondary | ICD-10-CM | POA: Diagnosis not present

## 2017-04-23 DIAGNOSIS — Z08 Encounter for follow-up examination after completed treatment for malignant neoplasm: Secondary | ICD-10-CM | POA: Diagnosis not present

## 2017-04-23 DIAGNOSIS — E7800 Pure hypercholesterolemia, unspecified: Secondary | ICD-10-CM | POA: Diagnosis not present

## 2017-04-23 DIAGNOSIS — Z955 Presence of coronary angioplasty implant and graft: Secondary | ICD-10-CM | POA: Diagnosis not present

## 2017-04-23 DIAGNOSIS — I2510 Atherosclerotic heart disease of native coronary artery without angina pectoris: Secondary | ICD-10-CM | POA: Diagnosis not present

## 2017-04-23 DIAGNOSIS — N183 Chronic kidney disease, stage 3 (moderate): Secondary | ICD-10-CM | POA: Diagnosis not present

## 2017-04-23 DIAGNOSIS — R1312 Dysphagia, oropharyngeal phase: Secondary | ICD-10-CM | POA: Diagnosis not present

## 2017-04-23 DIAGNOSIS — C040 Malignant neoplasm of anterior floor of mouth: Secondary | ICD-10-CM | POA: Diagnosis not present

## 2017-04-23 DIAGNOSIS — Z7982 Long term (current) use of aspirin: Secondary | ICD-10-CM | POA: Diagnosis not present

## 2017-04-23 DIAGNOSIS — Z931 Gastrostomy status: Secondary | ICD-10-CM | POA: Diagnosis not present

## 2017-04-23 DIAGNOSIS — Z930 Tracheostomy status: Secondary | ICD-10-CM | POA: Diagnosis not present

## 2017-04-23 DIAGNOSIS — I129 Hypertensive chronic kidney disease with stage 1 through stage 4 chronic kidney disease, or unspecified chronic kidney disease: Secondary | ICD-10-CM | POA: Diagnosis not present

## 2017-04-23 DIAGNOSIS — Z888 Allergy status to other drugs, medicaments and biological substances status: Secondary | ICD-10-CM | POA: Diagnosis not present

## 2017-04-23 DIAGNOSIS — Z79899 Other long term (current) drug therapy: Secondary | ICD-10-CM | POA: Diagnosis not present

## 2017-04-23 DIAGNOSIS — Z483 Aftercare following surgery for neoplasm: Secondary | ICD-10-CM | POA: Diagnosis not present

## 2017-04-23 DIAGNOSIS — Z434 Encounter for attention to other artificial openings of digestive tract: Secondary | ICD-10-CM | POA: Diagnosis not present

## 2017-04-28 ENCOUNTER — Other Ambulatory Visit: Payer: Medicare Other

## 2017-04-28 ENCOUNTER — Telehealth: Payer: Self-pay | Admitting: Internal Medicine

## 2017-04-28 NOTE — Telephone Encounter (Signed)
Faxed records to [facility] [phone]

## 2017-04-30 DIAGNOSIS — R1310 Dysphagia, unspecified: Secondary | ICD-10-CM | POA: Diagnosis not present

## 2017-04-30 DIAGNOSIS — Z4659 Encounter for fitting and adjustment of other gastrointestinal appliance and device: Secondary | ICD-10-CM | POA: Diagnosis not present

## 2017-04-30 DIAGNOSIS — R1312 Dysphagia, oropharyngeal phase: Secondary | ICD-10-CM | POA: Diagnosis not present

## 2017-04-30 DIAGNOSIS — C080 Malignant neoplasm of submandibular gland: Secondary | ICD-10-CM | POA: Diagnosis not present

## 2017-05-01 ENCOUNTER — Ambulatory Visit: Payer: Medicare Other | Admitting: Internal Medicine

## 2017-05-02 DIAGNOSIS — C040 Malignant neoplasm of anterior floor of mouth: Secondary | ICD-10-CM | POA: Diagnosis not present

## 2017-05-02 DIAGNOSIS — Z483 Aftercare following surgery for neoplasm: Secondary | ICD-10-CM | POA: Diagnosis not present

## 2017-05-02 DIAGNOSIS — I2510 Atherosclerotic heart disease of native coronary artery without angina pectoris: Secondary | ICD-10-CM | POA: Diagnosis not present

## 2017-05-02 DIAGNOSIS — N183 Chronic kidney disease, stage 3 (moderate): Secondary | ICD-10-CM | POA: Diagnosis not present

## 2017-05-02 DIAGNOSIS — I129 Hypertensive chronic kidney disease with stage 1 through stage 4 chronic kidney disease, or unspecified chronic kidney disease: Secondary | ICD-10-CM | POA: Diagnosis not present

## 2017-05-02 DIAGNOSIS — Z434 Encounter for attention to other artificial openings of digestive tract: Secondary | ICD-10-CM | POA: Diagnosis not present

## 2017-05-06 DIAGNOSIS — Z859 Personal history of malignant neoplasm, unspecified: Secondary | ICD-10-CM | POA: Diagnosis not present

## 2017-05-06 DIAGNOSIS — Z79899 Other long term (current) drug therapy: Secondary | ICD-10-CM | POA: Diagnosis not present

## 2017-05-06 DIAGNOSIS — Z931 Gastrostomy status: Secondary | ICD-10-CM | POA: Diagnosis not present

## 2017-05-06 DIAGNOSIS — C080 Malignant neoplasm of submandibular gland: Secondary | ICD-10-CM | POA: Diagnosis not present

## 2017-05-06 DIAGNOSIS — Z885 Allergy status to narcotic agent status: Secondary | ICD-10-CM | POA: Diagnosis not present

## 2017-05-06 DIAGNOSIS — E7800 Pure hypercholesterolemia, unspecified: Secondary | ICD-10-CM | POA: Diagnosis not present

## 2017-05-06 DIAGNOSIS — Z888 Allergy status to other drugs, medicaments and biological substances status: Secondary | ICD-10-CM | POA: Diagnosis not present

## 2017-05-06 DIAGNOSIS — R1312 Dysphagia, oropharyngeal phase: Secondary | ICD-10-CM | POA: Diagnosis not present

## 2017-05-07 DIAGNOSIS — Z7902 Long term (current) use of antithrombotics/antiplatelets: Secondary | ICD-10-CM | POA: Diagnosis not present

## 2017-05-07 DIAGNOSIS — Z955 Presence of coronary angioplasty implant and graft: Secondary | ICD-10-CM | POA: Diagnosis not present

## 2017-05-07 DIAGNOSIS — E7800 Pure hypercholesterolemia, unspecified: Secondary | ICD-10-CM | POA: Diagnosis not present

## 2017-05-07 DIAGNOSIS — Z923 Personal history of irradiation: Secondary | ICD-10-CM | POA: Diagnosis not present

## 2017-05-07 DIAGNOSIS — Z7982 Long term (current) use of aspirin: Secondary | ICD-10-CM | POA: Diagnosis not present

## 2017-05-07 DIAGNOSIS — Z885 Allergy status to narcotic agent status: Secondary | ICD-10-CM | POA: Diagnosis not present

## 2017-05-07 DIAGNOSIS — Z888 Allergy status to other drugs, medicaments and biological substances status: Secondary | ICD-10-CM | POA: Diagnosis not present

## 2017-05-07 DIAGNOSIS — Z9889 Other specified postprocedural states: Secondary | ICD-10-CM | POA: Diagnosis not present

## 2017-05-07 DIAGNOSIS — Z79899 Other long term (current) drug therapy: Secondary | ICD-10-CM | POA: Diagnosis not present

## 2017-05-07 DIAGNOSIS — C080 Malignant neoplasm of submandibular gland: Secondary | ICD-10-CM | POA: Diagnosis not present

## 2017-05-07 DIAGNOSIS — C089 Malignant neoplasm of major salivary gland, unspecified: Secondary | ICD-10-CM | POA: Diagnosis not present

## 2017-05-07 DIAGNOSIS — Z85818 Personal history of malignant neoplasm of other sites of lip, oral cavity, and pharynx: Secondary | ICD-10-CM | POA: Diagnosis not present

## 2017-05-09 DIAGNOSIS — I2510 Atherosclerotic heart disease of native coronary artery without angina pectoris: Secondary | ICD-10-CM | POA: Diagnosis not present

## 2017-05-09 DIAGNOSIS — Z434 Encounter for attention to other artificial openings of digestive tract: Secondary | ICD-10-CM | POA: Diagnosis not present

## 2017-05-09 DIAGNOSIS — N183 Chronic kidney disease, stage 3 (moderate): Secondary | ICD-10-CM | POA: Diagnosis not present

## 2017-05-09 DIAGNOSIS — Z483 Aftercare following surgery for neoplasm: Secondary | ICD-10-CM | POA: Diagnosis not present

## 2017-05-09 DIAGNOSIS — C040 Malignant neoplasm of anterior floor of mouth: Secondary | ICD-10-CM | POA: Diagnosis not present

## 2017-05-09 DIAGNOSIS — I129 Hypertensive chronic kidney disease with stage 1 through stage 4 chronic kidney disease, or unspecified chronic kidney disease: Secondary | ICD-10-CM | POA: Diagnosis not present

## 2017-05-12 DIAGNOSIS — Z510 Encounter for antineoplastic radiation therapy: Secondary | ICD-10-CM | POA: Diagnosis not present

## 2017-05-12 DIAGNOSIS — C080 Malignant neoplasm of submandibular gland: Secondary | ICD-10-CM | POA: Diagnosis not present

## 2017-05-12 DIAGNOSIS — Z5181 Encounter for therapeutic drug level monitoring: Secondary | ICD-10-CM | POA: Diagnosis not present

## 2017-05-12 DIAGNOSIS — Z79899 Other long term (current) drug therapy: Secondary | ICD-10-CM | POA: Diagnosis not present

## 2017-05-13 DIAGNOSIS — Z483 Aftercare following surgery for neoplasm: Secondary | ICD-10-CM | POA: Diagnosis not present

## 2017-05-13 DIAGNOSIS — I2510 Atherosclerotic heart disease of native coronary artery without angina pectoris: Secondary | ICD-10-CM | POA: Diagnosis not present

## 2017-05-13 DIAGNOSIS — C040 Malignant neoplasm of anterior floor of mouth: Secondary | ICD-10-CM | POA: Diagnosis not present

## 2017-05-13 DIAGNOSIS — I129 Hypertensive chronic kidney disease with stage 1 through stage 4 chronic kidney disease, or unspecified chronic kidney disease: Secondary | ICD-10-CM | POA: Diagnosis not present

## 2017-05-13 DIAGNOSIS — C080 Malignant neoplasm of submandibular gland: Secondary | ICD-10-CM | POA: Diagnosis not present

## 2017-05-13 DIAGNOSIS — N183 Chronic kidney disease, stage 3 (moderate): Secondary | ICD-10-CM | POA: Diagnosis not present

## 2017-05-13 DIAGNOSIS — Z434 Encounter for attention to other artificial openings of digestive tract: Secondary | ICD-10-CM | POA: Diagnosis not present

## 2017-05-14 DIAGNOSIS — Z79899 Other long term (current) drug therapy: Secondary | ICD-10-CM | POA: Diagnosis not present

## 2017-05-14 DIAGNOSIS — Z5181 Encounter for therapeutic drug level monitoring: Secondary | ICD-10-CM | POA: Diagnosis not present

## 2017-05-14 DIAGNOSIS — Z510 Encounter for antineoplastic radiation therapy: Secondary | ICD-10-CM | POA: Diagnosis not present

## 2017-05-14 DIAGNOSIS — C080 Malignant neoplasm of submandibular gland: Secondary | ICD-10-CM | POA: Diagnosis not present

## 2017-05-15 DIAGNOSIS — Z79899 Other long term (current) drug therapy: Secondary | ICD-10-CM | POA: Diagnosis not present

## 2017-05-15 DIAGNOSIS — C080 Malignant neoplasm of submandibular gland: Secondary | ICD-10-CM | POA: Diagnosis not present

## 2017-05-15 DIAGNOSIS — Z510 Encounter for antineoplastic radiation therapy: Secondary | ICD-10-CM | POA: Diagnosis not present

## 2017-05-15 DIAGNOSIS — Z5181 Encounter for therapeutic drug level monitoring: Secondary | ICD-10-CM | POA: Diagnosis not present

## 2017-05-16 DIAGNOSIS — C080 Malignant neoplasm of submandibular gland: Secondary | ICD-10-CM | POA: Diagnosis not present

## 2017-05-21 ENCOUNTER — Other Ambulatory Visit: Payer: Self-pay | Admitting: Internal Medicine

## 2017-05-22 DIAGNOSIS — Z510 Encounter for antineoplastic radiation therapy: Secondary | ICD-10-CM | POA: Diagnosis not present

## 2017-05-22 DIAGNOSIS — C080 Malignant neoplasm of submandibular gland: Secondary | ICD-10-CM | POA: Diagnosis not present

## 2017-05-22 DIAGNOSIS — Z79899 Other long term (current) drug therapy: Secondary | ICD-10-CM | POA: Diagnosis not present

## 2017-05-22 DIAGNOSIS — Z5181 Encounter for therapeutic drug level monitoring: Secondary | ICD-10-CM | POA: Diagnosis not present

## 2017-05-23 DIAGNOSIS — Z79899 Other long term (current) drug therapy: Secondary | ICD-10-CM | POA: Diagnosis not present

## 2017-05-23 DIAGNOSIS — C080 Malignant neoplasm of submandibular gland: Secondary | ICD-10-CM | POA: Diagnosis not present

## 2017-05-23 DIAGNOSIS — Z5181 Encounter for therapeutic drug level monitoring: Secondary | ICD-10-CM | POA: Diagnosis not present

## 2017-05-23 DIAGNOSIS — Z510 Encounter for antineoplastic radiation therapy: Secondary | ICD-10-CM | POA: Diagnosis not present

## 2017-05-26 DIAGNOSIS — C080 Malignant neoplasm of submandibular gland: Secondary | ICD-10-CM | POA: Diagnosis not present

## 2017-05-26 DIAGNOSIS — Z510 Encounter for antineoplastic radiation therapy: Secondary | ICD-10-CM | POA: Diagnosis not present

## 2017-05-26 DIAGNOSIS — Z79899 Other long term (current) drug therapy: Secondary | ICD-10-CM | POA: Diagnosis not present

## 2017-05-26 DIAGNOSIS — Z5181 Encounter for therapeutic drug level monitoring: Secondary | ICD-10-CM | POA: Diagnosis not present

## 2017-05-27 DIAGNOSIS — I2510 Atherosclerotic heart disease of native coronary artery without angina pectoris: Secondary | ICD-10-CM | POA: Diagnosis not present

## 2017-05-27 DIAGNOSIS — Z510 Encounter for antineoplastic radiation therapy: Secondary | ICD-10-CM | POA: Diagnosis not present

## 2017-05-27 DIAGNOSIS — Z483 Aftercare following surgery for neoplasm: Secondary | ICD-10-CM | POA: Diagnosis not present

## 2017-05-27 DIAGNOSIS — Z79899 Other long term (current) drug therapy: Secondary | ICD-10-CM | POA: Diagnosis not present

## 2017-05-27 DIAGNOSIS — I129 Hypertensive chronic kidney disease with stage 1 through stage 4 chronic kidney disease, or unspecified chronic kidney disease: Secondary | ICD-10-CM | POA: Diagnosis not present

## 2017-05-27 DIAGNOSIS — C040 Malignant neoplasm of anterior floor of mouth: Secondary | ICD-10-CM | POA: Diagnosis not present

## 2017-05-27 DIAGNOSIS — Z434 Encounter for attention to other artificial openings of digestive tract: Secondary | ICD-10-CM | POA: Diagnosis not present

## 2017-05-27 DIAGNOSIS — C080 Malignant neoplasm of submandibular gland: Secondary | ICD-10-CM | POA: Diagnosis not present

## 2017-05-27 DIAGNOSIS — N183 Chronic kidney disease, stage 3 (moderate): Secondary | ICD-10-CM | POA: Diagnosis not present

## 2017-05-27 DIAGNOSIS — Z5181 Encounter for therapeutic drug level monitoring: Secondary | ICD-10-CM | POA: Diagnosis not present

## 2017-05-28 DIAGNOSIS — Z5181 Encounter for therapeutic drug level monitoring: Secondary | ICD-10-CM | POA: Diagnosis not present

## 2017-05-28 DIAGNOSIS — Z510 Encounter for antineoplastic radiation therapy: Secondary | ICD-10-CM | POA: Diagnosis not present

## 2017-05-28 DIAGNOSIS — C080 Malignant neoplasm of submandibular gland: Secondary | ICD-10-CM | POA: Diagnosis not present

## 2017-05-28 DIAGNOSIS — Z79899 Other long term (current) drug therapy: Secondary | ICD-10-CM | POA: Diagnosis not present

## 2017-05-29 DIAGNOSIS — C080 Malignant neoplasm of submandibular gland: Secondary | ICD-10-CM | POA: Diagnosis not present

## 2017-05-29 DIAGNOSIS — Z79899 Other long term (current) drug therapy: Secondary | ICD-10-CM | POA: Diagnosis not present

## 2017-05-29 DIAGNOSIS — Z510 Encounter for antineoplastic radiation therapy: Secondary | ICD-10-CM | POA: Diagnosis not present

## 2017-05-29 DIAGNOSIS — Z5181 Encounter for therapeutic drug level monitoring: Secondary | ICD-10-CM | POA: Diagnosis not present

## 2017-05-30 DIAGNOSIS — Z79899 Other long term (current) drug therapy: Secondary | ICD-10-CM | POA: Diagnosis not present

## 2017-05-30 DIAGNOSIS — C080 Malignant neoplasm of submandibular gland: Secondary | ICD-10-CM | POA: Diagnosis not present

## 2017-05-30 DIAGNOSIS — Z5181 Encounter for therapeutic drug level monitoring: Secondary | ICD-10-CM | POA: Diagnosis not present

## 2017-05-30 DIAGNOSIS — Z510 Encounter for antineoplastic radiation therapy: Secondary | ICD-10-CM | POA: Diagnosis not present

## 2017-06-02 DIAGNOSIS — Z79899 Other long term (current) drug therapy: Secondary | ICD-10-CM | POA: Diagnosis not present

## 2017-06-02 DIAGNOSIS — Z5181 Encounter for therapeutic drug level monitoring: Secondary | ICD-10-CM | POA: Diagnosis not present

## 2017-06-02 DIAGNOSIS — Z510 Encounter for antineoplastic radiation therapy: Secondary | ICD-10-CM | POA: Diagnosis not present

## 2017-06-02 DIAGNOSIS — C080 Malignant neoplasm of submandibular gland: Secondary | ICD-10-CM | POA: Diagnosis not present

## 2017-06-03 DIAGNOSIS — Z5181 Encounter for therapeutic drug level monitoring: Secondary | ICD-10-CM | POA: Diagnosis not present

## 2017-06-03 DIAGNOSIS — Z79899 Other long term (current) drug therapy: Secondary | ICD-10-CM | POA: Diagnosis not present

## 2017-06-03 DIAGNOSIS — Z510 Encounter for antineoplastic radiation therapy: Secondary | ICD-10-CM | POA: Diagnosis not present

## 2017-06-03 DIAGNOSIS — C080 Malignant neoplasm of submandibular gland: Secondary | ICD-10-CM | POA: Diagnosis not present

## 2017-06-04 DIAGNOSIS — Z79899 Other long term (current) drug therapy: Secondary | ICD-10-CM | POA: Diagnosis not present

## 2017-06-04 DIAGNOSIS — C080 Malignant neoplasm of submandibular gland: Secondary | ICD-10-CM | POA: Diagnosis not present

## 2017-06-04 DIAGNOSIS — Z5181 Encounter for therapeutic drug level monitoring: Secondary | ICD-10-CM | POA: Diagnosis not present

## 2017-06-04 DIAGNOSIS — Z510 Encounter for antineoplastic radiation therapy: Secondary | ICD-10-CM | POA: Diagnosis not present

## 2017-06-05 DIAGNOSIS — C080 Malignant neoplasm of submandibular gland: Secondary | ICD-10-CM | POA: Diagnosis not present

## 2017-06-05 DIAGNOSIS — Z79899 Other long term (current) drug therapy: Secondary | ICD-10-CM | POA: Diagnosis not present

## 2017-06-05 DIAGNOSIS — Z5181 Encounter for therapeutic drug level monitoring: Secondary | ICD-10-CM | POA: Diagnosis not present

## 2017-06-05 DIAGNOSIS — Z510 Encounter for antineoplastic radiation therapy: Secondary | ICD-10-CM | POA: Diagnosis not present

## 2017-06-06 DIAGNOSIS — Z5181 Encounter for therapeutic drug level monitoring: Secondary | ICD-10-CM | POA: Diagnosis not present

## 2017-06-06 DIAGNOSIS — Z510 Encounter for antineoplastic radiation therapy: Secondary | ICD-10-CM | POA: Diagnosis not present

## 2017-06-06 DIAGNOSIS — C080 Malignant neoplasm of submandibular gland: Secondary | ICD-10-CM | POA: Diagnosis not present

## 2017-06-06 DIAGNOSIS — Z79899 Other long term (current) drug therapy: Secondary | ICD-10-CM | POA: Diagnosis not present

## 2017-06-09 DIAGNOSIS — C080 Malignant neoplasm of submandibular gland: Secondary | ICD-10-CM | POA: Diagnosis not present

## 2017-06-09 DIAGNOSIS — Z9221 Personal history of antineoplastic chemotherapy: Secondary | ICD-10-CM | POA: Diagnosis not present

## 2017-06-09 DIAGNOSIS — K1230 Oral mucositis (ulcerative), unspecified: Secondary | ICD-10-CM | POA: Diagnosis not present

## 2017-06-09 DIAGNOSIS — C7951 Secondary malignant neoplasm of bone: Secondary | ICD-10-CM | POA: Diagnosis not present

## 2017-06-09 DIAGNOSIS — Z9889 Other specified postprocedural states: Secondary | ICD-10-CM | POA: Diagnosis not present

## 2017-06-09 DIAGNOSIS — Z510 Encounter for antineoplastic radiation therapy: Secondary | ICD-10-CM | POA: Diagnosis not present

## 2017-06-10 DIAGNOSIS — R1312 Dysphagia, oropharyngeal phase: Secondary | ICD-10-CM | POA: Diagnosis not present

## 2017-06-10 DIAGNOSIS — Z9221 Personal history of antineoplastic chemotherapy: Secondary | ICD-10-CM | POA: Diagnosis not present

## 2017-06-10 DIAGNOSIS — Z9889 Other specified postprocedural states: Secondary | ICD-10-CM | POA: Diagnosis not present

## 2017-06-10 DIAGNOSIS — Z510 Encounter for antineoplastic radiation therapy: Secondary | ICD-10-CM | POA: Diagnosis not present

## 2017-06-10 DIAGNOSIS — C080 Malignant neoplasm of submandibular gland: Secondary | ICD-10-CM | POA: Diagnosis not present

## 2017-06-10 DIAGNOSIS — K1230 Oral mucositis (ulcerative), unspecified: Secondary | ICD-10-CM | POA: Diagnosis not present

## 2017-06-10 DIAGNOSIS — C7951 Secondary malignant neoplasm of bone: Secondary | ICD-10-CM | POA: Diagnosis not present

## 2017-06-12 DIAGNOSIS — K1230 Oral mucositis (ulcerative), unspecified: Secondary | ICD-10-CM | POA: Diagnosis not present

## 2017-06-12 DIAGNOSIS — C7951 Secondary malignant neoplasm of bone: Secondary | ICD-10-CM | POA: Diagnosis not present

## 2017-06-12 DIAGNOSIS — Z9889 Other specified postprocedural states: Secondary | ICD-10-CM | POA: Diagnosis not present

## 2017-06-12 DIAGNOSIS — C080 Malignant neoplasm of submandibular gland: Secondary | ICD-10-CM | POA: Diagnosis not present

## 2017-06-12 DIAGNOSIS — Z510 Encounter for antineoplastic radiation therapy: Secondary | ICD-10-CM | POA: Diagnosis not present

## 2017-06-12 DIAGNOSIS — Z9221 Personal history of antineoplastic chemotherapy: Secondary | ICD-10-CM | POA: Diagnosis not present

## 2017-06-13 DIAGNOSIS — Z9889 Other specified postprocedural states: Secondary | ICD-10-CM | POA: Diagnosis not present

## 2017-06-13 DIAGNOSIS — C080 Malignant neoplasm of submandibular gland: Secondary | ICD-10-CM | POA: Diagnosis not present

## 2017-06-13 DIAGNOSIS — Z9221 Personal history of antineoplastic chemotherapy: Secondary | ICD-10-CM | POA: Diagnosis not present

## 2017-06-13 DIAGNOSIS — Z510 Encounter for antineoplastic radiation therapy: Secondary | ICD-10-CM | POA: Diagnosis not present

## 2017-06-13 DIAGNOSIS — C7951 Secondary malignant neoplasm of bone: Secondary | ICD-10-CM | POA: Diagnosis not present

## 2017-06-13 DIAGNOSIS — K1230 Oral mucositis (ulcerative), unspecified: Secondary | ICD-10-CM | POA: Diagnosis not present

## 2017-06-16 DIAGNOSIS — Z9221 Personal history of antineoplastic chemotherapy: Secondary | ICD-10-CM | POA: Diagnosis not present

## 2017-06-16 DIAGNOSIS — Z9889 Other specified postprocedural states: Secondary | ICD-10-CM | POA: Diagnosis not present

## 2017-06-16 DIAGNOSIS — Z510 Encounter for antineoplastic radiation therapy: Secondary | ICD-10-CM | POA: Diagnosis not present

## 2017-06-16 DIAGNOSIS — K1230 Oral mucositis (ulcerative), unspecified: Secondary | ICD-10-CM | POA: Diagnosis not present

## 2017-06-16 DIAGNOSIS — C080 Malignant neoplasm of submandibular gland: Secondary | ICD-10-CM | POA: Diagnosis not present

## 2017-06-16 DIAGNOSIS — C7951 Secondary malignant neoplasm of bone: Secondary | ICD-10-CM | POA: Diagnosis not present

## 2017-06-17 DIAGNOSIS — Z9889 Other specified postprocedural states: Secondary | ICD-10-CM | POA: Diagnosis not present

## 2017-06-17 DIAGNOSIS — C080 Malignant neoplasm of submandibular gland: Secondary | ICD-10-CM | POA: Diagnosis not present

## 2017-06-17 DIAGNOSIS — Z9221 Personal history of antineoplastic chemotherapy: Secondary | ICD-10-CM | POA: Diagnosis not present

## 2017-06-17 DIAGNOSIS — C7951 Secondary malignant neoplasm of bone: Secondary | ICD-10-CM | POA: Diagnosis not present

## 2017-06-17 DIAGNOSIS — Z510 Encounter for antineoplastic radiation therapy: Secondary | ICD-10-CM | POA: Diagnosis not present

## 2017-06-17 DIAGNOSIS — K1230 Oral mucositis (ulcerative), unspecified: Secondary | ICD-10-CM | POA: Diagnosis not present

## 2017-06-18 ENCOUNTER — Other Ambulatory Visit: Payer: Self-pay | Admitting: Internal Medicine

## 2017-06-18 DIAGNOSIS — Z9221 Personal history of antineoplastic chemotherapy: Secondary | ICD-10-CM | POA: Diagnosis not present

## 2017-06-18 DIAGNOSIS — C080 Malignant neoplasm of submandibular gland: Secondary | ICD-10-CM | POA: Diagnosis not present

## 2017-06-18 DIAGNOSIS — Z9889 Other specified postprocedural states: Secondary | ICD-10-CM | POA: Diagnosis not present

## 2017-06-18 DIAGNOSIS — K1230 Oral mucositis (ulcerative), unspecified: Secondary | ICD-10-CM | POA: Diagnosis not present

## 2017-06-18 DIAGNOSIS — Z510 Encounter for antineoplastic radiation therapy: Secondary | ICD-10-CM | POA: Diagnosis not present

## 2017-06-18 DIAGNOSIS — C7951 Secondary malignant neoplasm of bone: Secondary | ICD-10-CM | POA: Diagnosis not present

## 2017-06-19 DIAGNOSIS — Z510 Encounter for antineoplastic radiation therapy: Secondary | ICD-10-CM | POA: Diagnosis not present

## 2017-06-19 DIAGNOSIS — K1230 Oral mucositis (ulcerative), unspecified: Secondary | ICD-10-CM | POA: Diagnosis not present

## 2017-06-19 DIAGNOSIS — C080 Malignant neoplasm of submandibular gland: Secondary | ICD-10-CM | POA: Diagnosis not present

## 2017-06-19 DIAGNOSIS — Z9221 Personal history of antineoplastic chemotherapy: Secondary | ICD-10-CM | POA: Diagnosis not present

## 2017-06-19 DIAGNOSIS — C7951 Secondary malignant neoplasm of bone: Secondary | ICD-10-CM | POA: Diagnosis not present

## 2017-06-19 DIAGNOSIS — Z9889 Other specified postprocedural states: Secondary | ICD-10-CM | POA: Diagnosis not present

## 2017-06-20 DIAGNOSIS — Z9221 Personal history of antineoplastic chemotherapy: Secondary | ICD-10-CM | POA: Diagnosis not present

## 2017-06-20 DIAGNOSIS — K1230 Oral mucositis (ulcerative), unspecified: Secondary | ICD-10-CM | POA: Diagnosis not present

## 2017-06-20 DIAGNOSIS — C7951 Secondary malignant neoplasm of bone: Secondary | ICD-10-CM | POA: Diagnosis not present

## 2017-06-20 DIAGNOSIS — Z510 Encounter for antineoplastic radiation therapy: Secondary | ICD-10-CM | POA: Diagnosis not present

## 2017-06-20 DIAGNOSIS — C080 Malignant neoplasm of submandibular gland: Secondary | ICD-10-CM | POA: Diagnosis not present

## 2017-06-20 DIAGNOSIS — Z9889 Other specified postprocedural states: Secondary | ICD-10-CM | POA: Diagnosis not present

## 2017-06-23 ENCOUNTER — Ambulatory Visit: Payer: Medicare Other | Admitting: Podiatry

## 2017-06-23 DIAGNOSIS — C080 Malignant neoplasm of submandibular gland: Secondary | ICD-10-CM | POA: Diagnosis not present

## 2017-06-23 DIAGNOSIS — Z9221 Personal history of antineoplastic chemotherapy: Secondary | ICD-10-CM | POA: Diagnosis not present

## 2017-06-23 DIAGNOSIS — K1230 Oral mucositis (ulcerative), unspecified: Secondary | ICD-10-CM | POA: Diagnosis not present

## 2017-06-23 DIAGNOSIS — Z510 Encounter for antineoplastic radiation therapy: Secondary | ICD-10-CM | POA: Diagnosis not present

## 2017-06-23 DIAGNOSIS — C7951 Secondary malignant neoplasm of bone: Secondary | ICD-10-CM | POA: Diagnosis not present

## 2017-06-23 DIAGNOSIS — Z9889 Other specified postprocedural states: Secondary | ICD-10-CM | POA: Diagnosis not present

## 2017-06-24 DIAGNOSIS — Z9221 Personal history of antineoplastic chemotherapy: Secondary | ICD-10-CM | POA: Diagnosis not present

## 2017-06-24 DIAGNOSIS — K1230 Oral mucositis (ulcerative), unspecified: Secondary | ICD-10-CM | POA: Diagnosis not present

## 2017-06-24 DIAGNOSIS — Z9889 Other specified postprocedural states: Secondary | ICD-10-CM | POA: Diagnosis not present

## 2017-06-24 DIAGNOSIS — C7951 Secondary malignant neoplasm of bone: Secondary | ICD-10-CM | POA: Diagnosis not present

## 2017-06-24 DIAGNOSIS — Z510 Encounter for antineoplastic radiation therapy: Secondary | ICD-10-CM | POA: Diagnosis not present

## 2017-06-24 DIAGNOSIS — C080 Malignant neoplasm of submandibular gland: Secondary | ICD-10-CM | POA: Diagnosis not present

## 2017-06-25 DIAGNOSIS — Z9889 Other specified postprocedural states: Secondary | ICD-10-CM | POA: Diagnosis not present

## 2017-06-25 DIAGNOSIS — Z510 Encounter for antineoplastic radiation therapy: Secondary | ICD-10-CM | POA: Diagnosis not present

## 2017-06-25 DIAGNOSIS — C7951 Secondary malignant neoplasm of bone: Secondary | ICD-10-CM | POA: Diagnosis not present

## 2017-06-25 DIAGNOSIS — C080 Malignant neoplasm of submandibular gland: Secondary | ICD-10-CM | POA: Diagnosis not present

## 2017-06-25 DIAGNOSIS — Z9221 Personal history of antineoplastic chemotherapy: Secondary | ICD-10-CM | POA: Diagnosis not present

## 2017-06-25 DIAGNOSIS — K1230 Oral mucositis (ulcerative), unspecified: Secondary | ICD-10-CM | POA: Diagnosis not present

## 2017-06-26 DIAGNOSIS — Z510 Encounter for antineoplastic radiation therapy: Secondary | ICD-10-CM | POA: Diagnosis not present

## 2017-06-26 DIAGNOSIS — Z9221 Personal history of antineoplastic chemotherapy: Secondary | ICD-10-CM | POA: Diagnosis not present

## 2017-06-26 DIAGNOSIS — C7951 Secondary malignant neoplasm of bone: Secondary | ICD-10-CM | POA: Diagnosis not present

## 2017-06-26 DIAGNOSIS — K1230 Oral mucositis (ulcerative), unspecified: Secondary | ICD-10-CM | POA: Diagnosis not present

## 2017-06-26 DIAGNOSIS — Z9889 Other specified postprocedural states: Secondary | ICD-10-CM | POA: Diagnosis not present

## 2017-06-26 DIAGNOSIS — C080 Malignant neoplasm of submandibular gland: Secondary | ICD-10-CM | POA: Diagnosis not present

## 2017-06-27 DIAGNOSIS — Z9889 Other specified postprocedural states: Secondary | ICD-10-CM | POA: Diagnosis not present

## 2017-06-27 DIAGNOSIS — C080 Malignant neoplasm of submandibular gland: Secondary | ICD-10-CM | POA: Diagnosis not present

## 2017-06-27 DIAGNOSIS — K1230 Oral mucositis (ulcerative), unspecified: Secondary | ICD-10-CM | POA: Diagnosis not present

## 2017-06-27 DIAGNOSIS — Z9221 Personal history of antineoplastic chemotherapy: Secondary | ICD-10-CM | POA: Diagnosis not present

## 2017-06-27 DIAGNOSIS — Z510 Encounter for antineoplastic radiation therapy: Secondary | ICD-10-CM | POA: Diagnosis not present

## 2017-06-27 DIAGNOSIS — C7951 Secondary malignant neoplasm of bone: Secondary | ICD-10-CM | POA: Diagnosis not present

## 2017-06-30 DIAGNOSIS — Z9889 Other specified postprocedural states: Secondary | ICD-10-CM | POA: Diagnosis not present

## 2017-06-30 DIAGNOSIS — Z510 Encounter for antineoplastic radiation therapy: Secondary | ICD-10-CM | POA: Diagnosis not present

## 2017-06-30 DIAGNOSIS — K1230 Oral mucositis (ulcerative), unspecified: Secondary | ICD-10-CM | POA: Diagnosis not present

## 2017-06-30 DIAGNOSIS — C7951 Secondary malignant neoplasm of bone: Secondary | ICD-10-CM | POA: Diagnosis not present

## 2017-06-30 DIAGNOSIS — Z9221 Personal history of antineoplastic chemotherapy: Secondary | ICD-10-CM | POA: Diagnosis not present

## 2017-06-30 DIAGNOSIS — C080 Malignant neoplasm of submandibular gland: Secondary | ICD-10-CM | POA: Diagnosis not present

## 2017-07-01 DIAGNOSIS — K1230 Oral mucositis (ulcerative), unspecified: Secondary | ICD-10-CM | POA: Diagnosis not present

## 2017-07-01 DIAGNOSIS — C7951 Secondary malignant neoplasm of bone: Secondary | ICD-10-CM | POA: Diagnosis not present

## 2017-07-01 DIAGNOSIS — Z510 Encounter for antineoplastic radiation therapy: Secondary | ICD-10-CM | POA: Diagnosis not present

## 2017-07-01 DIAGNOSIS — Z9221 Personal history of antineoplastic chemotherapy: Secondary | ICD-10-CM | POA: Diagnosis not present

## 2017-07-01 DIAGNOSIS — Z9889 Other specified postprocedural states: Secondary | ICD-10-CM | POA: Diagnosis not present

## 2017-07-01 DIAGNOSIS — C080 Malignant neoplasm of submandibular gland: Secondary | ICD-10-CM | POA: Diagnosis not present

## 2017-07-02 DIAGNOSIS — C080 Malignant neoplasm of submandibular gland: Secondary | ICD-10-CM | POA: Diagnosis not present

## 2017-07-02 DIAGNOSIS — K1230 Oral mucositis (ulcerative), unspecified: Secondary | ICD-10-CM | POA: Diagnosis not present

## 2017-07-02 DIAGNOSIS — C7951 Secondary malignant neoplasm of bone: Secondary | ICD-10-CM | POA: Diagnosis not present

## 2017-07-02 DIAGNOSIS — Z9889 Other specified postprocedural states: Secondary | ICD-10-CM | POA: Diagnosis not present

## 2017-07-02 DIAGNOSIS — Z510 Encounter for antineoplastic radiation therapy: Secondary | ICD-10-CM | POA: Diagnosis not present

## 2017-07-02 DIAGNOSIS — Z9221 Personal history of antineoplastic chemotherapy: Secondary | ICD-10-CM | POA: Diagnosis not present

## 2017-07-03 DIAGNOSIS — C7951 Secondary malignant neoplasm of bone: Secondary | ICD-10-CM | POA: Diagnosis not present

## 2017-07-03 DIAGNOSIS — K1230 Oral mucositis (ulcerative), unspecified: Secondary | ICD-10-CM | POA: Diagnosis not present

## 2017-07-03 DIAGNOSIS — Z9889 Other specified postprocedural states: Secondary | ICD-10-CM | POA: Diagnosis not present

## 2017-07-03 DIAGNOSIS — Z510 Encounter for antineoplastic radiation therapy: Secondary | ICD-10-CM | POA: Diagnosis not present

## 2017-07-03 DIAGNOSIS — C080 Malignant neoplasm of submandibular gland: Secondary | ICD-10-CM | POA: Diagnosis not present

## 2017-07-03 DIAGNOSIS — Z9221 Personal history of antineoplastic chemotherapy: Secondary | ICD-10-CM | POA: Diagnosis not present

## 2017-07-20 ENCOUNTER — Other Ambulatory Visit: Payer: Self-pay | Admitting: Internal Medicine

## 2017-07-21 ENCOUNTER — Ambulatory Visit (INDEPENDENT_AMBULATORY_CARE_PROVIDER_SITE_OTHER): Payer: Medicare Other | Admitting: Podiatry

## 2017-07-21 ENCOUNTER — Encounter: Payer: Self-pay | Admitting: Podiatry

## 2017-07-21 DIAGNOSIS — B351 Tinea unguium: Secondary | ICD-10-CM | POA: Diagnosis not present

## 2017-07-21 DIAGNOSIS — M79675 Pain in left toe(s): Secondary | ICD-10-CM | POA: Diagnosis not present

## 2017-07-21 DIAGNOSIS — M79674 Pain in right toe(s): Secondary | ICD-10-CM | POA: Diagnosis not present

## 2017-07-21 MED ORDER — UREA 40 % EX OINT: TOPICAL_OINTMENT | CUTANEOUS | 0 refills | Status: DC | PRN

## 2017-07-22 ENCOUNTER — Telehealth: Payer: Self-pay | Admitting: Podiatry

## 2017-07-22 NOTE — Telephone Encounter (Signed)
My husband was in there on 13 August and Dr. Jacqualyn Posey was going to send a prescription to the pharmacy for his foot. We went to get it and the pharmacy was going to charge $168 but we are under the impression we can get the same prescription at your office for $22. I'm not sure of the name of the medication but could you please call me back and let me know if you sell it in your office and the price. I'm calling for him, because he had surgery and he cannot talk so I'm talking for him. Please call me back at [phone]. Thank you.

## 2017-07-23 ENCOUNTER — Other Ambulatory Visit: Payer: Self-pay

## 2017-07-23 MED ORDER — UREA 40 % EX CREA: 1.0000 g | TOPICAL_CREAM | Freq: Two times a day (BID) | CUTANEOUS | 3 refills | Status: DC

## 2017-07-23 NOTE — Progress Notes (Signed)
Subjective: 82 y.o. returns the office today for painful, elongated, thickened toenails which he cannot trim himself. Denies any redness or drainage around the nails.  h has continue the gabapentin and this has been helping.Denies any acute changes since last appointment and no new complaints today. Denies any systemic complaints such as fevers, chills, nausea, vomiting.   Objective: AAO 3, NAD DP/PT pulses palpable, CRT less than 3 seconds Nails hypertrophic, dystrophic, elongated, brittle, discolored 10. There is tenderness overlying the nails 1-5 bilaterally. There is no surrounding erythema or drainage along the nail sites. No open lesions or pre-ulcerative lesions are identified. No other areas of tenderness bilateral lower extremities. No overlying edema, erythema, increased warmth. No pain with calf compression, swelling, warmth, erythema.  Assessment: Patient presents with symptomatic onychomycosis; neuropathy   Plan: -Treatment options including alternatives, risks, complications were discussed -Nails sharply debrided 10 without complication/bleeding. -Continue gabapentin at current dose.  -Discussed daily foot inspection. If there are any changes, to call the office immediately.  -Follow-up in 3 months or sooner if any problems are to arise. In the meantime, encouraged to call the office with any questions, concerns, changes symptoms.  Celesta Gentile, DPM

## 2017-07-28 ENCOUNTER — Other Ambulatory Visit: Payer: Self-pay | Admitting: Physician Assistant

## 2017-07-28 DIAGNOSIS — R1312 Dysphagia, oropharyngeal phase: Secondary | ICD-10-CM | POA: Diagnosis not present

## 2017-07-28 DIAGNOSIS — R1310 Dysphagia, unspecified: Secondary | ICD-10-CM | POA: Diagnosis not present

## 2017-07-28 DIAGNOSIS — C080 Malignant neoplasm of submandibular gland: Secondary | ICD-10-CM | POA: Diagnosis not present

## 2017-07-28 DIAGNOSIS — Z483 Aftercare following surgery for neoplasm: Secondary | ICD-10-CM | POA: Diagnosis not present

## 2017-07-30 DIAGNOSIS — R1312 Dysphagia, oropharyngeal phase: Secondary | ICD-10-CM | POA: Diagnosis not present

## 2017-08-05 ENCOUNTER — Other Ambulatory Visit: Payer: Self-pay | Admitting: *Deleted

## 2017-08-05 MED ORDER — AMLODIPINE BESYLATE 5 MG PO TABS
5.0000 mg | ORAL_TABLET | Freq: Every day | ORAL | 3 refills | Status: DC
Start: 2017-08-05 — End: 2018-02-11

## 2017-08-06 DIAGNOSIS — R1312 Dysphagia, oropharyngeal phase: Secondary | ICD-10-CM | POA: Diagnosis not present

## 2017-08-13 DIAGNOSIS — R1312 Dysphagia, oropharyngeal phase: Secondary | ICD-10-CM | POA: Diagnosis not present

## 2017-09-28 DIAGNOSIS — Z7982 Long term (current) use of aspirin: Secondary | ICD-10-CM | POA: Diagnosis not present

## 2017-09-28 DIAGNOSIS — Y846 Urinary catheterization as the cause of abnormal reaction of the patient, or of later complication, without mention of misadventure at the time of the procedure: Secondary | ICD-10-CM | POA: Diagnosis not present

## 2017-09-28 DIAGNOSIS — R531 Weakness: Secondary | ICD-10-CM | POA: Diagnosis not present

## 2017-09-28 DIAGNOSIS — Z79899 Other long term (current) drug therapy: Secondary | ICD-10-CM | POA: Diagnosis not present

## 2017-09-28 DIAGNOSIS — K9429 Other complications of gastrostomy: Secondary | ICD-10-CM | POA: Diagnosis not present

## 2017-09-28 DIAGNOSIS — E7800 Pure hypercholesterolemia, unspecified: Secondary | ICD-10-CM | POA: Diagnosis not present

## 2017-09-28 DIAGNOSIS — Y998 Other external cause status: Secondary | ICD-10-CM | POA: Diagnosis not present

## 2017-09-28 DIAGNOSIS — Z4682 Encounter for fitting and adjustment of non-vascular catheter: Secondary | ICD-10-CM | POA: Diagnosis not present

## 2017-09-28 DIAGNOSIS — Z888 Allergy status to other drugs, medicaments and biological substances status: Secondary | ICD-10-CM | POA: Diagnosis not present

## 2017-09-29 DIAGNOSIS — Z923 Personal history of irradiation: Secondary | ICD-10-CM | POA: Diagnosis not present

## 2017-09-29 DIAGNOSIS — R1312 Dysphagia, oropharyngeal phase: Secondary | ICD-10-CM | POA: Diagnosis not present

## 2017-09-29 DIAGNOSIS — Z85818 Personal history of malignant neoplasm of other sites of lip, oral cavity, and pharynx: Secondary | ICD-10-CM | POA: Diagnosis not present

## 2017-09-29 DIAGNOSIS — Z431 Encounter for attention to gastrostomy: Secondary | ICD-10-CM | POA: Diagnosis not present

## 2017-09-29 DIAGNOSIS — C080 Malignant neoplasm of submandibular gland: Secondary | ICD-10-CM | POA: Diagnosis not present

## 2017-10-07 DIAGNOSIS — D499 Neoplasm of unspecified behavior of unspecified site: Secondary | ICD-10-CM | POA: Diagnosis not present

## 2017-10-07 DIAGNOSIS — C760 Malignant neoplasm of head, face and neck: Secondary | ICD-10-CM | POA: Diagnosis not present

## 2017-10-07 DIAGNOSIS — R112 Nausea with vomiting, unspecified: Secondary | ICD-10-CM | POA: Diagnosis not present

## 2017-10-07 DIAGNOSIS — C080 Malignant neoplasm of submandibular gland: Secondary | ICD-10-CM | POA: Diagnosis not present

## 2017-10-07 DIAGNOSIS — K529 Noninfective gastroenteritis and colitis, unspecified: Secondary | ICD-10-CM | POA: Diagnosis not present

## 2017-10-07 DIAGNOSIS — R945 Abnormal results of liver function studies: Secondary | ICD-10-CM | POA: Diagnosis not present

## 2017-10-07 DIAGNOSIS — C040 Malignant neoplasm of anterior floor of mouth: Secondary | ICD-10-CM | POA: Diagnosis not present

## 2017-10-07 DIAGNOSIS — R5383 Other fatigue: Secondary | ICD-10-CM | POA: Diagnosis not present

## 2017-10-07 DIAGNOSIS — Z23 Encounter for immunization: Secondary | ICD-10-CM | POA: Diagnosis not present

## 2017-10-07 DIAGNOSIS — Z931 Gastrostomy status: Secondary | ICD-10-CM | POA: Diagnosis not present

## 2017-10-07 DIAGNOSIS — G629 Polyneuropathy, unspecified: Secondary | ICD-10-CM | POA: Diagnosis not present

## 2017-10-10 DIAGNOSIS — C787 Secondary malignant neoplasm of liver and intrahepatic bile duct: Secondary | ICD-10-CM | POA: Diagnosis not present

## 2017-10-10 DIAGNOSIS — I10 Essential (primary) hypertension: Secondary | ICD-10-CM | POA: Diagnosis not present

## 2017-10-10 DIAGNOSIS — R112 Nausea with vomiting, unspecified: Secondary | ICD-10-CM | POA: Diagnosis not present

## 2017-10-10 DIAGNOSIS — Z66 Do not resuscitate: Secondary | ICD-10-CM | POA: Diagnosis not present

## 2017-10-10 DIAGNOSIS — K219 Gastro-esophageal reflux disease without esophagitis: Secondary | ICD-10-CM | POA: Diagnosis not present

## 2017-10-10 DIAGNOSIS — Z923 Personal history of irradiation: Secondary | ICD-10-CM | POA: Diagnosis not present

## 2017-10-10 DIAGNOSIS — N183 Chronic kidney disease, stage 3 (moderate): Secondary | ICD-10-CM | POA: Diagnosis not present

## 2017-10-10 DIAGNOSIS — R197 Diarrhea, unspecified: Secondary | ICD-10-CM | POA: Diagnosis not present

## 2017-10-10 DIAGNOSIS — C080 Malignant neoplasm of submandibular gland: Secondary | ICD-10-CM | POA: Diagnosis not present

## 2017-10-10 DIAGNOSIS — Z85818 Personal history of malignant neoplasm of other sites of lip, oral cavity, and pharynx: Secondary | ICD-10-CM | POA: Diagnosis not present

## 2017-10-10 DIAGNOSIS — Z79899 Other long term (current) drug therapy: Secondary | ICD-10-CM | POA: Diagnosis not present

## 2017-10-10 DIAGNOSIS — Z931 Gastrostomy status: Secondary | ICD-10-CM | POA: Diagnosis not present

## 2017-10-10 DIAGNOSIS — C7951 Secondary malignant neoplasm of bone: Secondary | ICD-10-CM | POA: Diagnosis not present

## 2017-10-10 DIAGNOSIS — N400 Enlarged prostate without lower urinary tract symptoms: Secondary | ICD-10-CM | POA: Diagnosis not present

## 2017-10-10 DIAGNOSIS — I2510 Atherosclerotic heart disease of native coronary artery without angina pectoris: Secondary | ICD-10-CM | POA: Diagnosis not present

## 2017-10-10 DIAGNOSIS — I129 Hypertensive chronic kidney disease with stage 1 through stage 4 chronic kidney disease, or unspecified chronic kidney disease: Secondary | ICD-10-CM | POA: Diagnosis not present

## 2017-10-10 DIAGNOSIS — Z955 Presence of coronary angioplasty implant and graft: Secondary | ICD-10-CM | POA: Diagnosis not present

## 2017-10-11 DIAGNOSIS — R110 Nausea: Secondary | ICD-10-CM | POA: Diagnosis not present

## 2017-10-11 DIAGNOSIS — I110 Hypertensive heart disease with heart failure: Secondary | ICD-10-CM | POA: Diagnosis not present

## 2017-10-11 DIAGNOSIS — K529 Noninfective gastroenteritis and colitis, unspecified: Secondary | ICD-10-CM | POA: Diagnosis not present

## 2017-10-11 DIAGNOSIS — Z931 Gastrostomy status: Secondary | ICD-10-CM | POA: Diagnosis not present

## 2017-10-11 DIAGNOSIS — Z85818 Personal history of malignant neoplasm of other sites of lip, oral cavity, and pharynx: Secondary | ICD-10-CM | POA: Diagnosis not present

## 2017-10-11 DIAGNOSIS — R197 Diarrhea, unspecified: Secondary | ICD-10-CM | POA: Diagnosis not present

## 2017-10-11 DIAGNOSIS — C080 Malignant neoplasm of submandibular gland: Secondary | ICD-10-CM | POA: Diagnosis not present

## 2017-10-11 DIAGNOSIS — C7901 Secondary malignant neoplasm of right kidney and renal pelvis: Secondary | ICD-10-CM | POA: Diagnosis not present

## 2017-10-11 DIAGNOSIS — C787 Secondary malignant neoplasm of liver and intrahepatic bile duct: Secondary | ICD-10-CM | POA: Diagnosis not present

## 2017-10-11 DIAGNOSIS — R112 Nausea with vomiting, unspecified: Secondary | ICD-10-CM | POA: Diagnosis not present

## 2017-10-11 DIAGNOSIS — K589 Irritable bowel syndrome without diarrhea: Secondary | ICD-10-CM | POA: Diagnosis not present

## 2017-10-11 DIAGNOSIS — N183 Chronic kidney disease, stage 3 (moderate): Secondary | ICD-10-CM | POA: Diagnosis not present

## 2017-10-11 DIAGNOSIS — K5669 Other partial intestinal obstruction: Secondary | ICD-10-CM | POA: Diagnosis not present

## 2017-10-12 DIAGNOSIS — K589 Irritable bowel syndrome without diarrhea: Secondary | ICD-10-CM | POA: Diagnosis not present

## 2017-10-12 DIAGNOSIS — C080 Malignant neoplasm of submandibular gland: Secondary | ICD-10-CM | POA: Diagnosis not present

## 2017-10-12 DIAGNOSIS — Z85818 Personal history of malignant neoplasm of other sites of lip, oral cavity, and pharynx: Secondary | ICD-10-CM | POA: Diagnosis not present

## 2017-10-12 DIAGNOSIS — Z931 Gastrostomy status: Secondary | ICD-10-CM | POA: Diagnosis not present

## 2017-10-12 DIAGNOSIS — E785 Hyperlipidemia, unspecified: Secondary | ICD-10-CM | POA: Diagnosis not present

## 2017-10-12 DIAGNOSIS — C7901 Secondary malignant neoplasm of right kidney and renal pelvis: Secondary | ICD-10-CM | POA: Diagnosis not present

## 2017-10-12 DIAGNOSIS — R197 Diarrhea, unspecified: Secondary | ICD-10-CM | POA: Diagnosis not present

## 2017-10-12 DIAGNOSIS — N183 Chronic kidney disease, stage 3 (moderate): Secondary | ICD-10-CM | POA: Diagnosis not present

## 2017-10-12 DIAGNOSIS — I110 Hypertensive heart disease with heart failure: Secondary | ICD-10-CM | POA: Diagnosis not present

## 2017-10-12 DIAGNOSIS — C787 Secondary malignant neoplasm of liver and intrahepatic bile duct: Secondary | ICD-10-CM | POA: Diagnosis not present

## 2017-10-12 DIAGNOSIS — K5669 Other partial intestinal obstruction: Secondary | ICD-10-CM | POA: Diagnosis not present

## 2017-10-12 DIAGNOSIS — K529 Noninfective gastroenteritis and colitis, unspecified: Secondary | ICD-10-CM | POA: Diagnosis not present

## 2017-10-13 ENCOUNTER — Other Ambulatory Visit: Payer: Self-pay | Admitting: Internal Medicine

## 2017-10-13 DIAGNOSIS — Z931 Gastrostomy status: Secondary | ICD-10-CM | POA: Diagnosis not present

## 2017-10-13 DIAGNOSIS — Z85818 Personal history of malignant neoplasm of other sites of lip, oral cavity, and pharynx: Secondary | ICD-10-CM | POA: Diagnosis not present

## 2017-10-13 DIAGNOSIS — C080 Malignant neoplasm of submandibular gland: Secondary | ICD-10-CM | POA: Diagnosis not present

## 2017-10-13 DIAGNOSIS — C7901 Secondary malignant neoplasm of right kidney and renal pelvis: Secondary | ICD-10-CM | POA: Diagnosis not present

## 2017-10-13 DIAGNOSIS — C787 Secondary malignant neoplasm of liver and intrahepatic bile duct: Secondary | ICD-10-CM | POA: Diagnosis not present

## 2017-10-13 DIAGNOSIS — R112 Nausea with vomiting, unspecified: Secondary | ICD-10-CM | POA: Diagnosis not present

## 2017-10-17 DIAGNOSIS — C7989 Secondary malignant neoplasm of other specified sites: Secondary | ICD-10-CM | POA: Diagnosis not present

## 2017-10-17 DIAGNOSIS — R1310 Dysphagia, unspecified: Secondary | ICD-10-CM | POA: Diagnosis not present

## 2017-10-20 DIAGNOSIS — K529 Noninfective gastroenteritis and colitis, unspecified: Secondary | ICD-10-CM | POA: Diagnosis not present

## 2017-10-20 DIAGNOSIS — D499 Neoplasm of unspecified behavior of unspecified site: Secondary | ICD-10-CM | POA: Diagnosis not present

## 2017-10-20 DIAGNOSIS — C7951 Secondary malignant neoplasm of bone: Secondary | ICD-10-CM | POA: Diagnosis not present

## 2017-10-20 DIAGNOSIS — R945 Abnormal results of liver function studies: Secondary | ICD-10-CM | POA: Diagnosis not present

## 2017-10-20 DIAGNOSIS — C089 Malignant neoplasm of major salivary gland, unspecified: Secondary | ICD-10-CM | POA: Diagnosis not present

## 2017-10-20 DIAGNOSIS — C760 Malignant neoplasm of head, face and neck: Secondary | ICD-10-CM | POA: Diagnosis not present

## 2017-10-20 DIAGNOSIS — R5383 Other fatigue: Secondary | ICD-10-CM | POA: Diagnosis not present

## 2017-10-23 ENCOUNTER — Ambulatory Visit: Payer: Medicare Other | Admitting: Podiatry

## 2017-10-28 DIAGNOSIS — C7951 Secondary malignant neoplasm of bone: Secondary | ICD-10-CM | POA: Diagnosis not present

## 2017-10-28 DIAGNOSIS — Z79899 Other long term (current) drug therapy: Secondary | ICD-10-CM | POA: Diagnosis not present

## 2017-10-28 DIAGNOSIS — I959 Hypotension, unspecified: Secondary | ICD-10-CM | POA: Diagnosis not present

## 2017-10-28 DIAGNOSIS — Z923 Personal history of irradiation: Secondary | ICD-10-CM | POA: Diagnosis not present

## 2017-10-28 DIAGNOSIS — Z931 Gastrostomy status: Secondary | ICD-10-CM | POA: Diagnosis not present

## 2017-10-28 DIAGNOSIS — Z85818 Personal history of malignant neoplasm of other sites of lip, oral cavity, and pharynx: Secondary | ICD-10-CM | POA: Diagnosis not present

## 2017-10-28 DIAGNOSIS — Z888 Allergy status to other drugs, medicaments and biological substances status: Secondary | ICD-10-CM | POA: Diagnosis not present

## 2017-10-28 DIAGNOSIS — E860 Dehydration: Secondary | ICD-10-CM | POA: Diagnosis not present

## 2017-10-28 DIAGNOSIS — Z9221 Personal history of antineoplastic chemotherapy: Secondary | ICD-10-CM | POA: Diagnosis not present

## 2017-10-28 DIAGNOSIS — Z955 Presence of coronary angioplasty implant and graft: Secondary | ICD-10-CM | POA: Diagnosis not present

## 2017-10-28 DIAGNOSIS — G629 Polyneuropathy, unspecified: Secondary | ICD-10-CM | POA: Diagnosis not present

## 2017-10-28 DIAGNOSIS — Z9089 Acquired absence of other organs: Secondary | ICD-10-CM | POA: Diagnosis not present

## 2017-10-28 DIAGNOSIS — E7800 Pure hypercholesterolemia, unspecified: Secondary | ICD-10-CM | POA: Diagnosis not present

## 2017-10-28 DIAGNOSIS — C080 Malignant neoplasm of submandibular gland: Secondary | ICD-10-CM | POA: Diagnosis not present

## 2017-10-28 DIAGNOSIS — R112 Nausea with vomiting, unspecified: Secondary | ICD-10-CM | POA: Diagnosis not present

## 2017-11-05 DIAGNOSIS — C080 Malignant neoplasm of submandibular gland: Secondary | ICD-10-CM | POA: Diagnosis not present

## 2017-11-05 DIAGNOSIS — R1312 Dysphagia, oropharyngeal phase: Secondary | ICD-10-CM | POA: Diagnosis not present

## 2017-11-05 DIAGNOSIS — R1310 Dysphagia, unspecified: Secondary | ICD-10-CM | POA: Diagnosis not present

## 2017-11-05 DIAGNOSIS — Z483 Aftercare following surgery for neoplasm: Secondary | ICD-10-CM | POA: Diagnosis not present

## 2017-11-05 DIAGNOSIS — Z79899 Other long term (current) drug therapy: Secondary | ICD-10-CM | POA: Diagnosis not present

## 2017-11-11 DIAGNOSIS — Z923 Personal history of irradiation: Secondary | ICD-10-CM | POA: Diagnosis not present

## 2017-11-11 DIAGNOSIS — Z931 Gastrostomy status: Secondary | ICD-10-CM | POA: Diagnosis not present

## 2017-11-11 DIAGNOSIS — C080 Malignant neoplasm of submandibular gland: Secondary | ICD-10-CM | POA: Diagnosis not present

## 2017-11-11 DIAGNOSIS — Z85830 Personal history of malignant neoplasm of bone: Secondary | ICD-10-CM | POA: Diagnosis not present

## 2017-11-11 DIAGNOSIS — Z85818 Personal history of malignant neoplasm of other sites of lip, oral cavity, and pharynx: Secondary | ICD-10-CM | POA: Diagnosis not present

## 2017-11-11 DIAGNOSIS — Z9889 Other specified postprocedural states: Secondary | ICD-10-CM | POA: Diagnosis not present

## 2017-11-11 DIAGNOSIS — Z79899 Other long term (current) drug therapy: Secondary | ICD-10-CM | POA: Diagnosis not present

## 2017-11-11 DIAGNOSIS — C7989 Secondary malignant neoplasm of other specified sites: Secondary | ICD-10-CM | POA: Diagnosis not present

## 2017-11-11 DIAGNOSIS — G629 Polyneuropathy, unspecified: Secondary | ICD-10-CM | POA: Diagnosis not present

## 2017-11-11 DIAGNOSIS — R1312 Dysphagia, oropharyngeal phase: Secondary | ICD-10-CM | POA: Diagnosis not present

## 2017-11-12 ENCOUNTER — Other Ambulatory Visit: Payer: Self-pay | Admitting: Internal Medicine

## 2017-11-25 DIAGNOSIS — Z931 Gastrostomy status: Secondary | ICD-10-CM | POA: Diagnosis not present

## 2017-11-25 DIAGNOSIS — C080 Malignant neoplasm of submandibular gland: Secondary | ICD-10-CM | POA: Diagnosis not present

## 2017-11-25 DIAGNOSIS — Z923 Personal history of irradiation: Secondary | ICD-10-CM | POA: Diagnosis not present

## 2017-11-25 DIAGNOSIS — C7951 Secondary malignant neoplasm of bone: Secondary | ICD-10-CM | POA: Diagnosis not present

## 2017-11-25 DIAGNOSIS — Z5111 Encounter for antineoplastic chemotherapy: Secondary | ICD-10-CM | POA: Diagnosis not present

## 2017-11-25 DIAGNOSIS — N183 Chronic kidney disease, stage 3 (moderate): Secondary | ICD-10-CM | POA: Diagnosis not present

## 2017-11-25 DIAGNOSIS — R1312 Dysphagia, oropharyngeal phase: Secondary | ICD-10-CM | POA: Diagnosis not present

## 2017-11-25 DIAGNOSIS — R638 Other symptoms and signs concerning food and fluid intake: Secondary | ICD-10-CM | POA: Diagnosis not present

## 2017-11-25 DIAGNOSIS — C089 Malignant neoplasm of major salivary gland, unspecified: Secondary | ICD-10-CM | POA: Diagnosis not present

## 2017-12-11 DIAGNOSIS — Z5111 Encounter for antineoplastic chemotherapy: Secondary | ICD-10-CM | POA: Diagnosis not present

## 2017-12-11 DIAGNOSIS — C080 Malignant neoplasm of submandibular gland: Secondary | ICD-10-CM | POA: Diagnosis not present

## 2017-12-18 DIAGNOSIS — C080 Malignant neoplasm of submandibular gland: Secondary | ICD-10-CM | POA: Diagnosis not present

## 2017-12-18 DIAGNOSIS — Z5111 Encounter for antineoplastic chemotherapy: Secondary | ICD-10-CM | POA: Diagnosis not present

## 2017-12-23 DIAGNOSIS — H35033 Hypertensive retinopathy, bilateral: Secondary | ICD-10-CM | POA: Diagnosis not present

## 2017-12-23 DIAGNOSIS — H524 Presbyopia: Secondary | ICD-10-CM | POA: Diagnosis not present

## 2017-12-23 DIAGNOSIS — H353131 Nonexudative age-related macular degeneration, bilateral, early dry stage: Secondary | ICD-10-CM | POA: Diagnosis not present

## 2017-12-23 DIAGNOSIS — I10 Essential (primary) hypertension: Secondary | ICD-10-CM | POA: Diagnosis not present

## 2017-12-25 DIAGNOSIS — Z79899 Other long term (current) drug therapy: Secondary | ICD-10-CM | POA: Diagnosis not present

## 2017-12-25 DIAGNOSIS — C7951 Secondary malignant neoplasm of bone: Secondary | ICD-10-CM | POA: Diagnosis not present

## 2017-12-25 DIAGNOSIS — Z888 Allergy status to other drugs, medicaments and biological substances status: Secondary | ICD-10-CM | POA: Diagnosis not present

## 2017-12-25 DIAGNOSIS — C787 Secondary malignant neoplasm of liver and intrahepatic bile duct: Secondary | ICD-10-CM | POA: Diagnosis not present

## 2017-12-25 DIAGNOSIS — C080 Malignant neoplasm of submandibular gland: Secondary | ICD-10-CM | POA: Diagnosis not present

## 2017-12-25 DIAGNOSIS — N189 Chronic kidney disease, unspecified: Secondary | ICD-10-CM | POA: Diagnosis not present

## 2017-12-25 DIAGNOSIS — G629 Polyneuropathy, unspecified: Secondary | ICD-10-CM | POA: Diagnosis not present

## 2017-12-25 DIAGNOSIS — Z931 Gastrostomy status: Secondary | ICD-10-CM | POA: Diagnosis not present

## 2018-01-01 DIAGNOSIS — Z5111 Encounter for antineoplastic chemotherapy: Secondary | ICD-10-CM | POA: Diagnosis not present

## 2018-01-01 DIAGNOSIS — C080 Malignant neoplasm of submandibular gland: Secondary | ICD-10-CM | POA: Diagnosis not present

## 2018-01-08 DIAGNOSIS — R130 Aphagia: Secondary | ICD-10-CM | POA: Diagnosis not present

## 2018-01-08 DIAGNOSIS — C080 Malignant neoplasm of submandibular gland: Secondary | ICD-10-CM | POA: Diagnosis not present

## 2018-01-08 DIAGNOSIS — R5383 Other fatigue: Secondary | ICD-10-CM | POA: Diagnosis not present

## 2018-01-08 DIAGNOSIS — C7951 Secondary malignant neoplasm of bone: Secondary | ICD-10-CM | POA: Diagnosis not present

## 2018-01-08 DIAGNOSIS — R9389 Abnormal findings on diagnostic imaging of other specified body structures: Secondary | ICD-10-CM | POA: Diagnosis not present

## 2018-01-08 DIAGNOSIS — N189 Chronic kidney disease, unspecified: Secondary | ICD-10-CM | POA: Diagnosis not present

## 2018-01-08 DIAGNOSIS — Z931 Gastrostomy status: Secondary | ICD-10-CM | POA: Diagnosis not present

## 2018-01-08 DIAGNOSIS — Z923 Personal history of irradiation: Secondary | ICD-10-CM | POA: Diagnosis not present

## 2018-01-08 DIAGNOSIS — G629 Polyneuropathy, unspecified: Secondary | ICD-10-CM | POA: Diagnosis not present

## 2018-01-08 DIAGNOSIS — C787 Secondary malignant neoplasm of liver and intrahepatic bile duct: Secondary | ICD-10-CM | POA: Diagnosis not present

## 2018-01-08 DIAGNOSIS — I129 Hypertensive chronic kidney disease with stage 1 through stage 4 chronic kidney disease, or unspecified chronic kidney disease: Secondary | ICD-10-CM | POA: Diagnosis not present

## 2018-01-19 DIAGNOSIS — Z931 Gastrostomy status: Secondary | ICD-10-CM | POA: Diagnosis not present

## 2018-01-19 DIAGNOSIS — R1310 Dysphagia, unspecified: Secondary | ICD-10-CM | POA: Diagnosis not present

## 2018-01-22 DIAGNOSIS — C080 Malignant neoplasm of submandibular gland: Secondary | ICD-10-CM | POA: Diagnosis not present

## 2018-01-22 DIAGNOSIS — Z5111 Encounter for antineoplastic chemotherapy: Secondary | ICD-10-CM | POA: Diagnosis not present

## 2018-01-22 DIAGNOSIS — Z79899 Other long term (current) drug therapy: Secondary | ICD-10-CM | POA: Diagnosis not present

## 2018-01-29 DIAGNOSIS — Z923 Personal history of irradiation: Secondary | ICD-10-CM | POA: Diagnosis not present

## 2018-01-29 DIAGNOSIS — C080 Malignant neoplasm of submandibular gland: Secondary | ICD-10-CM | POA: Diagnosis not present

## 2018-01-29 DIAGNOSIS — G6289 Other specified polyneuropathies: Secondary | ICD-10-CM | POA: Diagnosis not present

## 2018-01-29 DIAGNOSIS — Z931 Gastrostomy status: Secondary | ICD-10-CM | POA: Diagnosis not present

## 2018-01-29 DIAGNOSIS — C7951 Secondary malignant neoplasm of bone: Secondary | ICD-10-CM | POA: Diagnosis not present

## 2018-01-29 DIAGNOSIS — C787 Secondary malignant neoplasm of liver and intrahepatic bile duct: Secondary | ICD-10-CM | POA: Diagnosis not present

## 2018-01-29 DIAGNOSIS — M899 Disorder of bone, unspecified: Secondary | ICD-10-CM | POA: Diagnosis not present

## 2018-01-29 DIAGNOSIS — R197 Diarrhea, unspecified: Secondary | ICD-10-CM | POA: Diagnosis not present

## 2018-01-29 DIAGNOSIS — R9389 Abnormal findings on diagnostic imaging of other specified body structures: Secondary | ICD-10-CM | POA: Diagnosis not present

## 2018-02-05 DIAGNOSIS — R350 Frequency of micturition: Secondary | ICD-10-CM | POA: Diagnosis not present

## 2018-02-05 DIAGNOSIS — Z931 Gastrostomy status: Secondary | ICD-10-CM | POA: Diagnosis not present

## 2018-02-05 DIAGNOSIS — G629 Polyneuropathy, unspecified: Secondary | ICD-10-CM | POA: Diagnosis not present

## 2018-02-05 DIAGNOSIS — R3915 Urgency of urination: Secondary | ICD-10-CM | POA: Diagnosis not present

## 2018-02-05 DIAGNOSIS — C787 Secondary malignant neoplasm of liver and intrahepatic bile duct: Secondary | ICD-10-CM | POA: Diagnosis not present

## 2018-02-05 DIAGNOSIS — C7951 Secondary malignant neoplasm of bone: Secondary | ICD-10-CM | POA: Diagnosis not present

## 2018-02-05 DIAGNOSIS — N401 Enlarged prostate with lower urinary tract symptoms: Secondary | ICD-10-CM | POA: Diagnosis not present

## 2018-02-05 DIAGNOSIS — C080 Malignant neoplasm of submandibular gland: Secondary | ICD-10-CM | POA: Diagnosis not present

## 2018-02-11 ENCOUNTER — Encounter: Payer: Self-pay | Admitting: Cardiovascular Disease

## 2018-02-11 ENCOUNTER — Ambulatory Visit (INDEPENDENT_AMBULATORY_CARE_PROVIDER_SITE_OTHER): Payer: Medicare Other | Admitting: Cardiovascular Disease

## 2018-02-11 VITALS — BP 124/70 | HR 78 | Ht 72.0 in | Wt 151.6 lb

## 2018-02-11 DIAGNOSIS — E785 Hyperlipidemia, unspecified: Secondary | ICD-10-CM

## 2018-02-11 DIAGNOSIS — I2510 Atherosclerotic heart disease of native coronary artery without angina pectoris: Secondary | ICD-10-CM | POA: Diagnosis not present

## 2018-02-11 DIAGNOSIS — R0602 Shortness of breath: Secondary | ICD-10-CM | POA: Diagnosis not present

## 2018-02-11 DIAGNOSIS — I10 Essential (primary) hypertension: Secondary | ICD-10-CM | POA: Diagnosis not present

## 2018-02-11 NOTE — Patient Instructions (Addendum)
Medication Instructions:  1) DECREASE METOPROLOL to 12.5 mg daily for a week then STOP METOPROLOL  Labwork: None  Testing/Procedures: Your provider has requested that you have an echocardiogram. Echocardiography is a painless test that uses sound waves to create images of your heart. It provides your doctor with information about the size and shape of your heart and how well your heart's chambers and valves are working. This procedure takes approximately one hour. There are no restrictions for this procedure.  Follow-Up: Your provider wants you to follow-up in: 1 year with Dr. Burt Knack. You will receive a reminder letter in the mail two months in advance. If you don't receive a letter, please call our office to schedule the follow-up appointment.    Any Other Special Instructions Will Be Listed Below (If Applicable).     If you need a refill on your cardiac medications before your next appointment, please call your pharmacy.

## 2018-02-11 NOTE — Progress Notes (Signed)
Cardiology Office Note Date:  02/11/2018   ID:  Jose Frank, DOB 06/07/1935, MRN [id number]  PCP:  Leighton Ruff, MD  Cardiologist:  Sherren Mocha, MD    Chief Complaint  Patient presents with  . Shortness of Breath     History of Present Illness: Jose Frank is a 83 y.o. male who presents for follow-up evaluation. The patient has coronary artery disease with a history of multiple PCI procedures. His most recent PCI procedure was in 2010 when he was treated with stenting of the LCx and complex PCI utilizing 4 stents in the right coronary artery. 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   The patient is here alone today.  He has had a tough time since I seen him last.  He has not eaten since April of last year and is reliant on gastric tube feeds 6 times per day.  He is lost a great deal of weight.  He continues to undergo chemotherapy treatment for metastatic cancer.  He complains of shortness of breath with activity which is new for him.  He has had no chest pain or pressure.  He has low blood pressure readings now and has stopped a lot of his cardiac medicines.  He remains on metoprolol 25 mg daily.  Past Medical History:  Diagnosis Date  . 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 Encounter for antineoplastic chemotherapy 05/27/2016  . History of echocardiogram    Echo 8/14:  EF 55-60%, Gr 1 DD  . Hx of cardiovascular stress test    a.  ETT-Myoview 8/14:  No scar or ischemia, EF 52%, normal study;  b. ETT-Myoview:  EF 55%, no ST changes, no ischemia; Normal   . Hyperlipidemia   . Hypertension   . IHD (ischemic heart disease)    Remote PCI to distal RCA and atherectomy of th LAD in 1996; S/P PCI to LCX and RCA in 2010.  . Metastatic adenocarcinoma ([place]) 04/04/2016  . Renal insufficiency    on Diovan  . Submandibular lymphadenopathy 03/21/2016    Past Surgical History:  Procedure Laterality Date  . CARDIAC CATHETERIZATION  09/13/2009  . CARDIAC  CATHETERIZATION  09/04/2009   EF 60%  . CARDIOVASCULAR STRESS TEST  12/08/2007   EF 57%  . PCI  08/2009   TO THE LCX AND RCA  . TONSILLECTOMY      Current Outpatient Medications  Medication Sig Dispense Refill  . gabapentin (NEURONTIN) 300 MG capsule Take 300 mg by mouth 3 (three) times daily.    . pantoprazole (PROTONIX) 20 MG tablet Take 20 mg by mouth daily.    . rosuvastatin (CRESTOR) 10 MG tablet Take 10 mg by mouth every evening.     . tamsulosin (FLOMAX) 0.4 MG CAPS capsule Take 0.4 mg by mouth every evening.   11   No current facility-administered medications for this visit.     Allergies:   Demerol and Myrbetriq [mirabegron]   Social History:  The patient  reports that  has never smoked. he has never used smokeless tobacco. He reports that he drinks alcohol. He reports that he does not use drugs.   Family History:  The patient's  family history includes Heart disease in his father; Stroke in his mother.    ROS:  Please see the history of present illness.  Otherwise, review of systems is positive for hearing loss, cough, diarrhea, nausea.  All other systems are reviewed and negative.    PHYSICAL EXAM: VS:  BP 124/70   Pulse 78   Ht 6' (1.829 m)   Wt 151 lb 9.6 oz (68.8 kg)   BMI 20.56 kg/m  , BMI Body mass index is 20.56 kg/m. GEN: Thin male, in no acute distress  HEENT: normal  Neck: no JVD, no masses. No carotid bruits Cardiac: RRR without murmur or gallop                Respiratory:  clear to auscultation bilaterally, normal work of breathing GI: soft, nontender, nondistended, + BS MS: no deformity or atrophy  Ext: no pretibial edema, pedal pulses 2+= bilaterally Skin: warm and dry, no rash Neuro:  Strength and sensation are intact Psych: euthymic mood, full affect  EKG:  EKG is ordered today. The ekg ordered today shows normal sinus rhythm 78 bpm, within normal limits.  Recent Labs: No results found for requested labs within last 8760 hours.   Lipid  Panel     Component Value Date/Time   CHOL 160 07/29/2011 0918   TRIG 99.0 07/29/2011 0918   HDL 61.30 07/29/2011 0918   CHOLHDL 3 07/29/2011 0918   VLDL 19.8 07/29/2011 0918   LDLCALC 79 07/29/2011 0918      Wt Readings from Last 3 Encounters:  02/11/18 151 lb 9.6 oz (68.8 kg)  01/29/17 182 lb 14.4 oz (83 kg)  01/10/17 182 lb 1.9 oz (82.6 kg)     Cardiac Studies Reviewed: Nuclear stress test 10/18/2015: Study Highlights    Nuclear stress EF: 55%.  There was no ST segment deviation noted during stress.  The study is normal.  This is a low risk study.  The left ventricular ejection fraction is normal (55-65%).     ASSESSMENT AND PLAN: 1.  CAD, native vessel: no angina. Had been on long-term DAPT after undergoing multiple stents in the past.  With recent complex medical problems related to metastatic cancer he is off of nearly all of his medications.  His blood pressure is running low.  We will continue with observation.  2. HTN: Blood pressure has been running low.  He brings in home readings which are frequently in the 90s.  He is off of amlodipine and his metoprolol has been reduced to 25 mg daily.  Will decrease metoprolol to 12.5 mg daily for 1 week then stop.  3. Hyperlipidemia: Off of lipid-lowering therapy in the setting of progressive weight loss and protein calorie malnutrition.  4.  Shortness of breath: We will update an echo to evaluate LV function and make sure that he has not developed a cardiomyopathy.  No clear evidence of volume overload on his exam.  Current medicines are reviewed with the patient today.  The patient does not have concerns regarding medicines.  Labs/ tests ordered today include:   Orders Placed This Encounter  Procedures  . EKG 12-Lead  . ECHOCARDIOGRAM COMPLETE    Disposition:   FU one year  Signed, Sherren Mocha, MD  02/11/2018 5:35 PM    [facility] [address], [place], [place]  [address] Phone:  [phone]; Fax: [phone]

## 2018-02-17 DIAGNOSIS — C089 Malignant neoplasm of major salivary gland, unspecified: Secondary | ICD-10-CM | POA: Diagnosis not present

## 2018-02-17 DIAGNOSIS — E7800 Pure hypercholesterolemia, unspecified: Secondary | ICD-10-CM | POA: Diagnosis not present

## 2018-02-17 DIAGNOSIS — Z978 Presence of other specified devices: Secondary | ICD-10-CM | POA: Diagnosis not present

## 2018-02-17 DIAGNOSIS — C799 Secondary malignant neoplasm of unspecified site: Secondary | ICD-10-CM | POA: Diagnosis not present

## 2018-02-17 DIAGNOSIS — I10 Essential (primary) hypertension: Secondary | ICD-10-CM | POA: Diagnosis not present

## 2018-02-17 DIAGNOSIS — I2510 Atherosclerotic heart disease of native coronary artery without angina pectoris: Secondary | ICD-10-CM | POA: Diagnosis not present

## 2018-02-17 DIAGNOSIS — N183 Chronic kidney disease, stage 3 (moderate): Secondary | ICD-10-CM | POA: Diagnosis not present

## 2018-02-17 DIAGNOSIS — Z955 Presence of coronary angioplasty implant and graft: Secondary | ICD-10-CM | POA: Diagnosis not present

## 2018-02-19 DIAGNOSIS — R350 Frequency of micturition: Secondary | ICD-10-CM | POA: Diagnosis not present

## 2018-02-19 DIAGNOSIS — C787 Secondary malignant neoplasm of liver and intrahepatic bile duct: Secondary | ICD-10-CM | POA: Diagnosis not present

## 2018-02-19 DIAGNOSIS — Z931 Gastrostomy status: Secondary | ICD-10-CM | POA: Diagnosis not present

## 2018-02-19 DIAGNOSIS — Z79899 Other long term (current) drug therapy: Secondary | ICD-10-CM | POA: Diagnosis not present

## 2018-02-19 DIAGNOSIS — G629 Polyneuropathy, unspecified: Secondary | ICD-10-CM | POA: Diagnosis not present

## 2018-02-19 DIAGNOSIS — G6289 Other specified polyneuropathies: Secondary | ICD-10-CM | POA: Diagnosis not present

## 2018-02-19 DIAGNOSIS — C7951 Secondary malignant neoplasm of bone: Secondary | ICD-10-CM | POA: Diagnosis not present

## 2018-02-19 DIAGNOSIS — C080 Malignant neoplasm of submandibular gland: Secondary | ICD-10-CM | POA: Diagnosis not present

## 2018-02-19 DIAGNOSIS — Z885 Allergy status to narcotic agent status: Secondary | ICD-10-CM | POA: Diagnosis not present

## 2018-02-19 DIAGNOSIS — Z888 Allergy status to other drugs, medicaments and biological substances status: Secondary | ICD-10-CM | POA: Diagnosis not present

## 2018-02-19 DIAGNOSIS — N400 Enlarged prostate without lower urinary tract symptoms: Secondary | ICD-10-CM | POA: Diagnosis not present

## 2018-02-19 DIAGNOSIS — R3915 Urgency of urination: Secondary | ICD-10-CM | POA: Diagnosis not present

## 2018-02-19 DIAGNOSIS — Z923 Personal history of irradiation: Secondary | ICD-10-CM | POA: Diagnosis not present

## 2018-02-19 DIAGNOSIS — N189 Chronic kidney disease, unspecified: Secondary | ICD-10-CM | POA: Diagnosis not present

## 2018-02-20 ENCOUNTER — Ambulatory Visit (HOSPITAL_COMMUNITY): Payer: Medicare Other

## 2018-03-13 DIAGNOSIS — C080 Malignant neoplasm of submandibular gland: Secondary | ICD-10-CM | POA: Diagnosis not present

## 2018-03-13 DIAGNOSIS — Z885 Allergy status to narcotic agent status: Secondary | ICD-10-CM | POA: Diagnosis not present

## 2018-03-13 DIAGNOSIS — C7951 Secondary malignant neoplasm of bone: Secondary | ICD-10-CM | POA: Diagnosis not present

## 2018-03-13 DIAGNOSIS — C787 Secondary malignant neoplasm of liver and intrahepatic bile duct: Secondary | ICD-10-CM | POA: Diagnosis not present

## 2018-03-13 DIAGNOSIS — Z79899 Other long term (current) drug therapy: Secondary | ICD-10-CM | POA: Diagnosis not present

## 2018-03-13 DIAGNOSIS — N189 Chronic kidney disease, unspecified: Secondary | ICD-10-CM | POA: Diagnosis not present

## 2018-03-13 DIAGNOSIS — G629 Polyneuropathy, unspecified: Secondary | ICD-10-CM | POA: Diagnosis not present

## 2018-03-13 DIAGNOSIS — Z931 Gastrostomy status: Secondary | ICD-10-CM | POA: Diagnosis not present

## 2018-03-13 DIAGNOSIS — R350 Frequency of micturition: Secondary | ICD-10-CM | POA: Diagnosis not present

## 2018-03-13 DIAGNOSIS — N400 Enlarged prostate without lower urinary tract symptoms: Secondary | ICD-10-CM | POA: Diagnosis not present

## 2018-03-13 DIAGNOSIS — R3915 Urgency of urination: Secondary | ICD-10-CM | POA: Diagnosis not present

## 2018-03-13 DIAGNOSIS — Z888 Allergy status to other drugs, medicaments and biological substances status: Secondary | ICD-10-CM | POA: Diagnosis not present

## 2018-03-13 DIAGNOSIS — I129 Hypertensive chronic kidney disease with stage 1 through stage 4 chronic kidney disease, or unspecified chronic kidney disease: Secondary | ICD-10-CM | POA: Diagnosis not present

## 2018-04-03 DIAGNOSIS — Z931 Gastrostomy status: Secondary | ICD-10-CM | POA: Diagnosis not present

## 2018-04-03 DIAGNOSIS — C080 Malignant neoplasm of submandibular gland: Secondary | ICD-10-CM | POA: Diagnosis not present

## 2018-04-03 DIAGNOSIS — C7951 Secondary malignant neoplasm of bone: Secondary | ICD-10-CM | POA: Diagnosis not present

## 2018-04-03 DIAGNOSIS — Z79899 Other long term (current) drug therapy: Secondary | ICD-10-CM | POA: Diagnosis not present

## 2018-04-03 DIAGNOSIS — C787 Secondary malignant neoplasm of liver and intrahepatic bile duct: Secondary | ICD-10-CM | POA: Diagnosis not present

## 2018-04-03 DIAGNOSIS — Z9221 Personal history of antineoplastic chemotherapy: Secondary | ICD-10-CM | POA: Diagnosis not present

## 2018-04-03 DIAGNOSIS — Z923 Personal history of irradiation: Secondary | ICD-10-CM | POA: Diagnosis not present

## 2018-04-03 DIAGNOSIS — Z85818 Personal history of malignant neoplasm of other sites of lip, oral cavity, and pharynx: Secondary | ICD-10-CM | POA: Diagnosis not present

## 2018-04-07 DIAGNOSIS — R351 Nocturia: Secondary | ICD-10-CM | POA: Diagnosis not present

## 2018-04-07 DIAGNOSIS — N401 Enlarged prostate with lower urinary tract symptoms: Secondary | ICD-10-CM | POA: Diagnosis not present

## 2018-04-09 DIAGNOSIS — L821 Other seborrheic keratosis: Secondary | ICD-10-CM | POA: Diagnosis not present

## 2018-04-20 DIAGNOSIS — R1310 Dysphagia, unspecified: Secondary | ICD-10-CM | POA: Diagnosis not present

## 2018-04-20 DIAGNOSIS — Z888 Allergy status to other drugs, medicaments and biological substances status: Secondary | ICD-10-CM | POA: Diagnosis not present

## 2018-04-20 DIAGNOSIS — Z885 Allergy status to narcotic agent status: Secondary | ICD-10-CM | POA: Diagnosis not present

## 2018-04-24 DIAGNOSIS — I129 Hypertensive chronic kidney disease with stage 1 through stage 4 chronic kidney disease, or unspecified chronic kidney disease: Secondary | ICD-10-CM | POA: Diagnosis not present

## 2018-04-24 DIAGNOSIS — R05 Cough: Secondary | ICD-10-CM | POA: Diagnosis not present

## 2018-04-24 DIAGNOSIS — K9423 Gastrostomy malfunction: Secondary | ICD-10-CM | POA: Diagnosis not present

## 2018-04-24 DIAGNOSIS — R5383 Other fatigue: Secondary | ICD-10-CM | POA: Diagnosis not present

## 2018-04-24 DIAGNOSIS — N189 Chronic kidney disease, unspecified: Secondary | ICD-10-CM | POA: Diagnosis not present

## 2018-04-24 DIAGNOSIS — C787 Secondary malignant neoplasm of liver and intrahepatic bile duct: Secondary | ICD-10-CM | POA: Diagnosis not present

## 2018-04-24 DIAGNOSIS — Z931 Gastrostomy status: Secondary | ICD-10-CM | POA: Diagnosis not present

## 2018-04-24 DIAGNOSIS — C089 Malignant neoplasm of major salivary gland, unspecified: Secondary | ICD-10-CM | POA: Diagnosis not present

## 2018-04-24 DIAGNOSIS — Z923 Personal history of irradiation: Secondary | ICD-10-CM | POA: Diagnosis not present

## 2018-04-24 DIAGNOSIS — Z85818 Personal history of malignant neoplasm of other sites of lip, oral cavity, and pharynx: Secondary | ICD-10-CM | POA: Diagnosis not present

## 2018-04-24 DIAGNOSIS — C7951 Secondary malignant neoplasm of bone: Secondary | ICD-10-CM | POA: Diagnosis not present

## 2018-04-24 DIAGNOSIS — D2230 Melanocytic nevi of unspecified part of face: Secondary | ICD-10-CM | POA: Diagnosis not present

## 2018-04-24 DIAGNOSIS — G629 Polyneuropathy, unspecified: Secondary | ICD-10-CM | POA: Diagnosis not present

## 2018-04-24 DIAGNOSIS — R350 Frequency of micturition: Secondary | ICD-10-CM | POA: Diagnosis not present

## 2018-04-24 DIAGNOSIS — C080 Malignant neoplasm of submandibular gland: Secondary | ICD-10-CM | POA: Diagnosis not present

## 2018-04-24 DIAGNOSIS — R682 Dry mouth, unspecified: Secondary | ICD-10-CM | POA: Diagnosis not present

## 2018-04-24 DIAGNOSIS — R3915 Urgency of urination: Secondary | ICD-10-CM | POA: Diagnosis not present

## 2018-04-24 DIAGNOSIS — N401 Enlarged prostate with lower urinary tract symptoms: Secondary | ICD-10-CM | POA: Diagnosis not present

## 2018-04-29 ENCOUNTER — Encounter (HOSPITAL_COMMUNITY): Payer: Self-pay

## 2018-04-29 ENCOUNTER — Other Ambulatory Visit (HOSPITAL_COMMUNITY): Payer: Medicare Other

## 2018-05-11 ENCOUNTER — Other Ambulatory Visit: Payer: Self-pay

## 2018-05-11 ENCOUNTER — Ambulatory Visit (HOSPITAL_COMMUNITY): Payer: Medicare Other | Attending: Cardiovascular Disease

## 2018-05-11 DIAGNOSIS — I255 Ischemic cardiomyopathy: Secondary | ICD-10-CM | POA: Diagnosis not present

## 2018-05-11 DIAGNOSIS — Z8589 Personal history of malignant neoplasm of other organs and systems: Secondary | ICD-10-CM | POA: Diagnosis not present

## 2018-05-11 DIAGNOSIS — E785 Hyperlipidemia, unspecified: Secondary | ICD-10-CM | POA: Insufficient documentation

## 2018-05-11 DIAGNOSIS — I082 Rheumatic disorders of both aortic and tricuspid valves: Secondary | ICD-10-CM | POA: Diagnosis not present

## 2018-05-11 DIAGNOSIS — R0602 Shortness of breath: Secondary | ICD-10-CM | POA: Insufficient documentation

## 2018-05-11 DIAGNOSIS — Z8249 Family history of ischemic heart disease and other diseases of the circulatory system: Secondary | ICD-10-CM | POA: Diagnosis not present

## 2018-05-11 DIAGNOSIS — I10 Essential (primary) hypertension: Secondary | ICD-10-CM | POA: Diagnosis not present

## 2018-05-22 DIAGNOSIS — C080 Malignant neoplasm of submandibular gland: Secondary | ICD-10-CM | POA: Diagnosis not present

## 2018-05-22 DIAGNOSIS — C7951 Secondary malignant neoplasm of bone: Secondary | ICD-10-CM | POA: Diagnosis not present

## 2018-05-22 DIAGNOSIS — R5383 Other fatigue: Secondary | ICD-10-CM | POA: Diagnosis not present

## 2018-05-22 DIAGNOSIS — D2230 Melanocytic nevi of unspecified part of face: Secondary | ICD-10-CM | POA: Diagnosis not present

## 2018-05-22 DIAGNOSIS — R350 Frequency of micturition: Secondary | ICD-10-CM | POA: Diagnosis not present

## 2018-05-22 DIAGNOSIS — R3915 Urgency of urination: Secondary | ICD-10-CM | POA: Diagnosis not present

## 2018-05-22 DIAGNOSIS — B370 Candidal stomatitis: Secondary | ICD-10-CM | POA: Diagnosis not present

## 2018-05-22 DIAGNOSIS — G629 Polyneuropathy, unspecified: Secondary | ICD-10-CM | POA: Diagnosis not present

## 2018-05-22 DIAGNOSIS — N189 Chronic kidney disease, unspecified: Secondary | ICD-10-CM | POA: Diagnosis not present

## 2018-05-22 DIAGNOSIS — R05 Cough: Secondary | ICD-10-CM | POA: Diagnosis not present

## 2018-05-22 DIAGNOSIS — N401 Enlarged prostate with lower urinary tract symptoms: Secondary | ICD-10-CM | POA: Diagnosis not present

## 2018-05-22 DIAGNOSIS — K769 Liver disease, unspecified: Secondary | ICD-10-CM | POA: Diagnosis not present

## 2018-05-22 DIAGNOSIS — N4289 Other specified disorders of prostate: Secondary | ICD-10-CM | POA: Diagnosis not present

## 2018-05-22 DIAGNOSIS — C787 Secondary malignant neoplasm of liver and intrahepatic bile duct: Secondary | ICD-10-CM | POA: Diagnosis not present

## 2018-05-22 DIAGNOSIS — Z931 Gastrostomy status: Secondary | ICD-10-CM | POA: Diagnosis not present

## 2018-05-28 DIAGNOSIS — H40023 Open angle with borderline findings, high risk, bilateral: Secondary | ICD-10-CM | POA: Diagnosis not present

## 2018-06-15 DIAGNOSIS — Z5111 Encounter for antineoplastic chemotherapy: Secondary | ICD-10-CM | POA: Diagnosis not present

## 2018-06-15 DIAGNOSIS — K769 Liver disease, unspecified: Secondary | ICD-10-CM | POA: Diagnosis not present

## 2018-06-15 DIAGNOSIS — C080 Malignant neoplasm of submandibular gland: Secondary | ICD-10-CM | POA: Diagnosis not present

## 2018-06-15 DIAGNOSIS — C7951 Secondary malignant neoplasm of bone: Secondary | ICD-10-CM | POA: Diagnosis not present

## 2018-06-18 DIAGNOSIS — Z931 Gastrostomy status: Secondary | ICD-10-CM | POA: Diagnosis not present

## 2018-06-18 DIAGNOSIS — C080 Malignant neoplasm of submandibular gland: Secondary | ICD-10-CM | POA: Diagnosis not present

## 2018-06-18 DIAGNOSIS — R350 Frequency of micturition: Secondary | ICD-10-CM | POA: Diagnosis not present

## 2018-06-18 DIAGNOSIS — G63 Polyneuropathy in diseases classified elsewhere: Secondary | ICD-10-CM | POA: Diagnosis not present

## 2018-06-18 DIAGNOSIS — Z85818 Personal history of malignant neoplasm of other sites of lip, oral cavity, and pharynx: Secondary | ICD-10-CM | POA: Diagnosis not present

## 2018-06-18 DIAGNOSIS — C7951 Secondary malignant neoplasm of bone: Secondary | ICD-10-CM | POA: Diagnosis not present

## 2018-06-18 DIAGNOSIS — R5383 Other fatigue: Secondary | ICD-10-CM | POA: Diagnosis not present

## 2018-06-18 DIAGNOSIS — R3915 Urgency of urination: Secondary | ICD-10-CM | POA: Diagnosis not present

## 2018-06-29 DIAGNOSIS — C080 Malignant neoplasm of submandibular gland: Secondary | ICD-10-CM | POA: Diagnosis not present

## 2018-07-02 DIAGNOSIS — D2230 Melanocytic nevi of unspecified part of face: Secondary | ICD-10-CM | POA: Diagnosis not present

## 2018-07-02 DIAGNOSIS — I129 Hypertensive chronic kidney disease with stage 1 through stage 4 chronic kidney disease, or unspecified chronic kidney disease: Secondary | ICD-10-CM | POA: Diagnosis not present

## 2018-07-02 DIAGNOSIS — C787 Secondary malignant neoplasm of liver and intrahepatic bile duct: Secondary | ICD-10-CM | POA: Diagnosis not present

## 2018-07-02 DIAGNOSIS — R5383 Other fatigue: Secondary | ICD-10-CM | POA: Diagnosis not present

## 2018-07-02 DIAGNOSIS — R05 Cough: Secondary | ICD-10-CM | POA: Diagnosis not present

## 2018-07-02 DIAGNOSIS — L02222 Furuncle of back [any part, except buttock]: Secondary | ICD-10-CM | POA: Diagnosis not present

## 2018-07-02 DIAGNOSIS — C7951 Secondary malignant neoplasm of bone: Secondary | ICD-10-CM | POA: Diagnosis not present

## 2018-07-02 DIAGNOSIS — R3915 Urgency of urination: Secondary | ICD-10-CM | POA: Diagnosis not present

## 2018-07-02 DIAGNOSIS — N401 Enlarged prostate with lower urinary tract symptoms: Secondary | ICD-10-CM | POA: Diagnosis not present

## 2018-07-02 DIAGNOSIS — R350 Frequency of micturition: Secondary | ICD-10-CM | POA: Diagnosis not present

## 2018-07-02 DIAGNOSIS — G629 Polyneuropathy, unspecified: Secondary | ICD-10-CM | POA: Diagnosis not present

## 2018-07-02 DIAGNOSIS — C080 Malignant neoplasm of submandibular gland: Secondary | ICD-10-CM | POA: Diagnosis not present

## 2018-07-02 DIAGNOSIS — B9789 Other viral agents as the cause of diseases classified elsewhere: Secondary | ICD-10-CM | POA: Diagnosis not present

## 2018-07-02 DIAGNOSIS — Z931 Gastrostomy status: Secondary | ICD-10-CM | POA: Diagnosis not present

## 2018-07-02 DIAGNOSIS — N189 Chronic kidney disease, unspecified: Secondary | ICD-10-CM | POA: Diagnosis not present

## 2018-07-14 DIAGNOSIS — C787 Secondary malignant neoplasm of liver and intrahepatic bile duct: Secondary | ICD-10-CM | POA: Diagnosis not present

## 2018-07-14 DIAGNOSIS — C080 Malignant neoplasm of submandibular gland: Secondary | ICD-10-CM | POA: Diagnosis not present

## 2018-07-20 DIAGNOSIS — R1312 Dysphagia, oropharyngeal phase: Secondary | ICD-10-CM | POA: Diagnosis not present

## 2018-07-20 DIAGNOSIS — C080 Malignant neoplasm of submandibular gland: Secondary | ICD-10-CM | POA: Diagnosis not present

## 2018-07-30 DIAGNOSIS — C080 Malignant neoplasm of submandibular gland: Secondary | ICD-10-CM | POA: Diagnosis not present

## 2018-07-30 DIAGNOSIS — Z923 Personal history of irradiation: Secondary | ICD-10-CM | POA: Diagnosis not present

## 2018-07-30 DIAGNOSIS — R110 Nausea: Secondary | ICD-10-CM | POA: Diagnosis not present

## 2018-07-30 DIAGNOSIS — R3915 Urgency of urination: Secondary | ICD-10-CM | POA: Diagnosis not present

## 2018-07-30 DIAGNOSIS — Z9221 Personal history of antineoplastic chemotherapy: Secondary | ICD-10-CM | POA: Diagnosis not present

## 2018-07-30 DIAGNOSIS — N189 Chronic kidney disease, unspecified: Secondary | ICD-10-CM | POA: Diagnosis not present

## 2018-07-30 DIAGNOSIS — R350 Frequency of micturition: Secondary | ICD-10-CM | POA: Diagnosis not present

## 2018-07-30 DIAGNOSIS — C7931 Secondary malignant neoplasm of brain: Secondary | ICD-10-CM | POA: Diagnosis not present

## 2018-07-30 DIAGNOSIS — C787 Secondary malignant neoplasm of liver and intrahepatic bile duct: Secondary | ICD-10-CM | POA: Diagnosis not present

## 2018-07-30 DIAGNOSIS — Z931 Gastrostomy status: Secondary | ICD-10-CM | POA: Diagnosis not present

## 2018-07-30 DIAGNOSIS — G629 Polyneuropathy, unspecified: Secondary | ICD-10-CM | POA: Diagnosis not present

## 2018-08-04 DIAGNOSIS — K9423 Gastrostomy malfunction: Secondary | ICD-10-CM | POA: Diagnosis not present

## 2018-08-04 DIAGNOSIS — Z431 Encounter for attention to gastrostomy: Secondary | ICD-10-CM | POA: Diagnosis not present

## 2018-08-04 DIAGNOSIS — R1312 Dysphagia, oropharyngeal phase: Secondary | ICD-10-CM | POA: Diagnosis not present

## 2018-08-05 DIAGNOSIS — Z885 Allergy status to narcotic agent status: Secondary | ICD-10-CM | POA: Diagnosis not present

## 2018-08-05 DIAGNOSIS — A429 Actinomycosis, unspecified: Secondary | ICD-10-CM | POA: Diagnosis not present

## 2018-08-05 DIAGNOSIS — L089 Local infection of the skin and subcutaneous tissue, unspecified: Secondary | ICD-10-CM | POA: Diagnosis not present

## 2018-08-05 DIAGNOSIS — C080 Malignant neoplasm of submandibular gland: Secondary | ICD-10-CM | POA: Diagnosis not present

## 2018-08-05 DIAGNOSIS — L729 Follicular cyst of the skin and subcutaneous tissue, unspecified: Secondary | ICD-10-CM | POA: Diagnosis not present

## 2018-08-05 DIAGNOSIS — Z888 Allergy status to other drugs, medicaments and biological substances status: Secondary | ICD-10-CM | POA: Diagnosis not present

## 2018-08-05 DIAGNOSIS — Z883 Allergy status to other anti-infective agents status: Secondary | ICD-10-CM | POA: Diagnosis not present

## 2018-08-05 DIAGNOSIS — D849 Immunodeficiency, unspecified: Secondary | ICD-10-CM | POA: Diagnosis not present

## 2018-08-27 DIAGNOSIS — Z931 Gastrostomy status: Secondary | ICD-10-CM | POA: Diagnosis not present

## 2018-08-27 DIAGNOSIS — L0291 Cutaneous abscess, unspecified: Secondary | ICD-10-CM | POA: Diagnosis not present

## 2018-08-27 DIAGNOSIS — R3915 Urgency of urination: Secondary | ICD-10-CM | POA: Diagnosis not present

## 2018-08-27 DIAGNOSIS — G63 Polyneuropathy in diseases classified elsewhere: Secondary | ICD-10-CM | POA: Diagnosis not present

## 2018-08-27 DIAGNOSIS — R350 Frequency of micturition: Secondary | ICD-10-CM | POA: Diagnosis not present

## 2018-08-27 DIAGNOSIS — R110 Nausea: Secondary | ICD-10-CM | POA: Diagnosis not present

## 2018-08-27 DIAGNOSIS — C787 Secondary malignant neoplasm of liver and intrahepatic bile duct: Secondary | ICD-10-CM | POA: Diagnosis not present

## 2018-08-27 DIAGNOSIS — C080 Malignant neoplasm of submandibular gland: Secondary | ICD-10-CM | POA: Diagnosis not present

## 2018-08-27 DIAGNOSIS — C7951 Secondary malignant neoplasm of bone: Secondary | ICD-10-CM | POA: Diagnosis not present

## 2018-08-27 DIAGNOSIS — B9689 Other specified bacterial agents as the cause of diseases classified elsewhere: Secondary | ICD-10-CM | POA: Diagnosis not present

## 2018-08-27 DIAGNOSIS — Z85818 Personal history of malignant neoplasm of other sites of lip, oral cavity, and pharynx: Secondary | ICD-10-CM | POA: Diagnosis not present

## 2018-09-02 DIAGNOSIS — L729 Follicular cyst of the skin and subcutaneous tissue, unspecified: Secondary | ICD-10-CM | POA: Diagnosis not present

## 2018-09-02 DIAGNOSIS — L089 Local infection of the skin and subcutaneous tissue, unspecified: Secondary | ICD-10-CM | POA: Diagnosis not present

## 2018-09-03 DIAGNOSIS — R3915 Urgency of urination: Secondary | ICD-10-CM | POA: Diagnosis not present

## 2018-09-03 DIAGNOSIS — R350 Frequency of micturition: Secondary | ICD-10-CM | POA: Diagnosis not present

## 2018-09-03 DIAGNOSIS — N401 Enlarged prostate with lower urinary tract symptoms: Secondary | ICD-10-CM | POA: Diagnosis not present

## 2018-09-03 DIAGNOSIS — C787 Secondary malignant neoplasm of liver and intrahepatic bile duct: Secondary | ICD-10-CM | POA: Diagnosis not present

## 2018-09-03 DIAGNOSIS — C7951 Secondary malignant neoplasm of bone: Secondary | ICD-10-CM | POA: Diagnosis not present

## 2018-09-03 DIAGNOSIS — R110 Nausea: Secondary | ICD-10-CM | POA: Diagnosis not present

## 2018-09-03 DIAGNOSIS — I129 Hypertensive chronic kidney disease with stage 1 through stage 4 chronic kidney disease, or unspecified chronic kidney disease: Secondary | ICD-10-CM | POA: Diagnosis not present

## 2018-09-03 DIAGNOSIS — R197 Diarrhea, unspecified: Secondary | ICD-10-CM | POA: Diagnosis not present

## 2018-09-03 DIAGNOSIS — C080 Malignant neoplasm of submandibular gland: Secondary | ICD-10-CM | POA: Diagnosis not present

## 2018-09-03 DIAGNOSIS — Z85818 Personal history of malignant neoplasm of other sites of lip, oral cavity, and pharynx: Secondary | ICD-10-CM | POA: Diagnosis not present

## 2018-09-03 DIAGNOSIS — N189 Chronic kidney disease, unspecified: Secondary | ICD-10-CM | POA: Diagnosis not present

## 2018-09-03 DIAGNOSIS — Z931 Gastrostomy status: Secondary | ICD-10-CM | POA: Diagnosis not present

## 2018-09-03 DIAGNOSIS — R54 Age-related physical debility: Secondary | ICD-10-CM | POA: Diagnosis not present

## 2018-09-17 DIAGNOSIS — C787 Secondary malignant neoplasm of liver and intrahepatic bile duct: Secondary | ICD-10-CM | POA: Diagnosis not present

## 2018-09-17 DIAGNOSIS — Z923 Personal history of irradiation: Secondary | ICD-10-CM | POA: Diagnosis not present

## 2018-09-17 DIAGNOSIS — Z79899 Other long term (current) drug therapy: Secondary | ICD-10-CM | POA: Diagnosis not present

## 2018-09-17 DIAGNOSIS — R3915 Urgency of urination: Secondary | ICD-10-CM | POA: Diagnosis not present

## 2018-09-17 DIAGNOSIS — G629 Polyneuropathy, unspecified: Secondary | ICD-10-CM | POA: Diagnosis not present

## 2018-09-17 DIAGNOSIS — R350 Frequency of micturition: Secondary | ICD-10-CM | POA: Diagnosis not present

## 2018-09-17 DIAGNOSIS — Z931 Gastrostomy status: Secondary | ICD-10-CM | POA: Diagnosis not present

## 2018-09-17 DIAGNOSIS — R110 Nausea: Secondary | ICD-10-CM | POA: Diagnosis not present

## 2018-09-17 DIAGNOSIS — Z885 Allergy status to narcotic agent status: Secondary | ICD-10-CM | POA: Diagnosis not present

## 2018-09-17 DIAGNOSIS — C7951 Secondary malignant neoplasm of bone: Secondary | ICD-10-CM | POA: Diagnosis not present

## 2018-09-17 DIAGNOSIS — Z883 Allergy status to other anti-infective agents status: Secondary | ICD-10-CM | POA: Diagnosis not present

## 2018-09-17 DIAGNOSIS — D649 Anemia, unspecified: Secondary | ICD-10-CM | POA: Diagnosis not present

## 2018-09-17 DIAGNOSIS — Z85818 Personal history of malignant neoplasm of other sites of lip, oral cavity, and pharynx: Secondary | ICD-10-CM | POA: Diagnosis not present

## 2018-09-17 DIAGNOSIS — Z955 Presence of coronary angioplasty implant and graft: Secondary | ICD-10-CM | POA: Diagnosis not present

## 2018-09-17 DIAGNOSIS — C080 Malignant neoplasm of submandibular gland: Secondary | ICD-10-CM | POA: Diagnosis not present

## 2018-09-17 DIAGNOSIS — Z8249 Family history of ischemic heart disease and other diseases of the circulatory system: Secondary | ICD-10-CM | POA: Diagnosis not present

## 2018-09-25 DIAGNOSIS — L89151 Pressure ulcer of sacral region, stage 1: Secondary | ICD-10-CM | POA: Diagnosis present

## 2018-09-25 DIAGNOSIS — E46 Unspecified protein-calorie malnutrition: Secondary | ICD-10-CM | POA: Diagnosis not present

## 2018-09-25 DIAGNOSIS — R1310 Dysphagia, unspecified: Secondary | ICD-10-CM | POA: Diagnosis not present

## 2018-09-25 DIAGNOSIS — C7951 Secondary malignant neoplasm of bone: Secondary | ICD-10-CM | POA: Diagnosis present

## 2018-09-25 DIAGNOSIS — A419 Sepsis, unspecified organism: Secondary | ICD-10-CM | POA: Diagnosis not present

## 2018-09-25 DIAGNOSIS — C799 Secondary malignant neoplasm of unspecified site: Secondary | ICD-10-CM | POA: Diagnosis not present

## 2018-09-25 DIAGNOSIS — Z955 Presence of coronary angioplasty implant and graft: Secondary | ICD-10-CM | POA: Diagnosis not present

## 2018-09-25 DIAGNOSIS — R110 Nausea: Secondary | ICD-10-CM | POA: Diagnosis not present

## 2018-09-25 DIAGNOSIS — I129 Hypertensive chronic kidney disease with stage 1 through stage 4 chronic kidney disease, or unspecified chronic kidney disease: Secondary | ICD-10-CM | POA: Diagnosis not present

## 2018-09-25 DIAGNOSIS — R2681 Unsteadiness on feet: Secondary | ICD-10-CM | POA: Diagnosis not present

## 2018-09-25 DIAGNOSIS — E872 Acidosis: Secondary | ICD-10-CM | POA: Diagnosis not present

## 2018-09-25 DIAGNOSIS — R41841 Cognitive communication deficit: Secondary | ICD-10-CM | POA: Diagnosis not present

## 2018-09-25 DIAGNOSIS — C080 Malignant neoplasm of submandibular gland: Secondary | ICD-10-CM | POA: Diagnosis not present

## 2018-09-25 DIAGNOSIS — Z66 Do not resuscitate: Secondary | ICD-10-CM | POA: Diagnosis not present

## 2018-09-25 DIAGNOSIS — R471 Dysarthria and anarthria: Secondary | ICD-10-CM | POA: Diagnosis not present

## 2018-09-25 DIAGNOSIS — Z7409 Other reduced mobility: Secondary | ICD-10-CM | POA: Diagnosis not present

## 2018-09-25 DIAGNOSIS — R109 Unspecified abdominal pain: Secondary | ICD-10-CM | POA: Diagnosis not present

## 2018-09-25 DIAGNOSIS — E785 Hyperlipidemia, unspecified: Secondary | ICD-10-CM | POA: Diagnosis not present

## 2018-09-25 DIAGNOSIS — R069 Unspecified abnormalities of breathing: Secondary | ICD-10-CM | POA: Diagnosis not present

## 2018-09-25 DIAGNOSIS — Z9981 Dependence on supplemental oxygen: Secondary | ICD-10-CM | POA: Diagnosis not present

## 2018-09-25 DIAGNOSIS — E860 Dehydration: Secondary | ICD-10-CM | POA: Diagnosis not present

## 2018-09-25 DIAGNOSIS — Z8249 Family history of ischemic heart disease and other diseases of the circulatory system: Secondary | ICD-10-CM | POA: Diagnosis not present

## 2018-09-25 DIAGNOSIS — R2689 Other abnormalities of gait and mobility: Secondary | ICD-10-CM | POA: Diagnosis not present

## 2018-09-25 DIAGNOSIS — Z452 Encounter for adjustment and management of vascular access device: Secondary | ICD-10-CM | POA: Diagnosis not present

## 2018-09-25 DIAGNOSIS — Z79899 Other long term (current) drug therapy: Secondary | ICD-10-CM | POA: Diagnosis not present

## 2018-09-25 DIAGNOSIS — Z743 Need for continuous supervision: Secondary | ICD-10-CM | POA: Diagnosis not present

## 2018-09-25 DIAGNOSIS — Z433 Encounter for attention to colostomy: Secondary | ICD-10-CM | POA: Diagnosis not present

## 2018-09-25 DIAGNOSIS — J9621 Acute and chronic respiratory failure with hypoxia: Secondary | ICD-10-CM | POA: Diagnosis not present

## 2018-09-25 DIAGNOSIS — Z792 Long term (current) use of antibiotics: Secondary | ICD-10-CM | POA: Diagnosis not present

## 2018-09-25 DIAGNOSIS — Z923 Personal history of irradiation: Secondary | ICD-10-CM | POA: Diagnosis not present

## 2018-09-25 DIAGNOSIS — R0602 Shortness of breath: Secondary | ICD-10-CM | POA: Diagnosis not present

## 2018-09-25 DIAGNOSIS — J690 Pneumonitis due to inhalation of food and vomit: Secondary | ICD-10-CM | POA: Diagnosis not present

## 2018-09-25 DIAGNOSIS — R627 Adult failure to thrive: Secondary | ICD-10-CM | POA: Diagnosis present

## 2018-09-25 DIAGNOSIS — N183 Chronic kidney disease, stage 3 (moderate): Secondary | ICD-10-CM | POA: Diagnosis not present

## 2018-09-25 DIAGNOSIS — R279 Unspecified lack of coordination: Secondary | ICD-10-CM | POA: Diagnosis not present

## 2018-09-25 DIAGNOSIS — J9601 Acute respiratory failure with hypoxia: Secondary | ICD-10-CM | POA: Diagnosis not present

## 2018-09-25 DIAGNOSIS — F05 Delirium due to known physiological condition: Secondary | ICD-10-CM | POA: Diagnosis not present

## 2018-09-25 DIAGNOSIS — E43 Unspecified severe protein-calorie malnutrition: Secondary | ICD-10-CM | POA: Diagnosis not present

## 2018-09-25 DIAGNOSIS — R579 Shock, unspecified: Secondary | ICD-10-CM | POA: Diagnosis not present

## 2018-09-25 DIAGNOSIS — M6281 Muscle weakness (generalized): Secondary | ICD-10-CM | POA: Diagnosis not present

## 2018-09-25 DIAGNOSIS — R197 Diarrhea, unspecified: Secondary | ICD-10-CM | POA: Diagnosis not present

## 2018-09-25 DIAGNOSIS — D649 Anemia, unspecified: Secondary | ICD-10-CM | POA: Diagnosis not present

## 2018-09-25 DIAGNOSIS — C787 Secondary malignant neoplasm of liver and intrahepatic bile duct: Secondary | ICD-10-CM | POA: Diagnosis not present

## 2018-09-25 DIAGNOSIS — N400 Enlarged prostate without lower urinary tract symptoms: Secondary | ICD-10-CM | POA: Diagnosis present

## 2018-09-25 DIAGNOSIS — B370 Candidal stomatitis: Secondary | ICD-10-CM | POA: Diagnosis present

## 2018-09-25 DIAGNOSIS — I10 Essential (primary) hypertension: Secondary | ICD-10-CM | POA: Diagnosis not present

## 2018-09-25 DIAGNOSIS — I2510 Atherosclerotic heart disease of native coronary artery without angina pectoris: Secondary | ICD-10-CM | POA: Diagnosis not present

## 2018-09-25 DIAGNOSIS — M1990 Unspecified osteoarthritis, unspecified site: Secondary | ICD-10-CM | POA: Diagnosis present

## 2018-09-25 DIAGNOSIS — G629 Polyneuropathy, unspecified: Secondary | ICD-10-CM | POA: Diagnosis present

## 2018-09-25 DIAGNOSIS — E7800 Pure hypercholesterolemia, unspecified: Secondary | ICD-10-CM | POA: Diagnosis not present

## 2018-09-25 DIAGNOSIS — R1032 Left lower quadrant pain: Secondary | ICD-10-CM | POA: Diagnosis not present

## 2018-09-25 DIAGNOSIS — R6521 Severe sepsis with septic shock: Secondary | ICD-10-CM | POA: Diagnosis not present

## 2018-09-25 DIAGNOSIS — N179 Acute kidney failure, unspecified: Secondary | ICD-10-CM | POA: Diagnosis not present

## 2018-10-09 DIAGNOSIS — C080 Malignant neoplasm of submandibular gland: Secondary | ICD-10-CM | POA: Diagnosis not present

## 2018-10-09 DIAGNOSIS — R000 Tachycardia, unspecified: Secondary | ICD-10-CM | POA: Diagnosis not present

## 2018-10-09 DIAGNOSIS — C787 Secondary malignant neoplasm of liver and intrahepatic bile duct: Secondary | ICD-10-CM | POA: Diagnosis not present

## 2018-10-09 DIAGNOSIS — N183 Chronic kidney disease, stage 3 (moderate): Secondary | ICD-10-CM | POA: Diagnosis not present

## 2018-10-09 DIAGNOSIS — Z9221 Personal history of antineoplastic chemotherapy: Secondary | ICD-10-CM | POA: Diagnosis not present

## 2018-10-09 DIAGNOSIS — C089 Malignant neoplasm of major salivary gland, unspecified: Secondary | ICD-10-CM | POA: Diagnosis not present

## 2018-10-09 DIAGNOSIS — G9341 Metabolic encephalopathy: Secondary | ICD-10-CM | POA: Diagnosis present

## 2018-10-09 DIAGNOSIS — G9340 Encephalopathy, unspecified: Secondary | ICD-10-CM | POA: Diagnosis not present

## 2018-10-09 DIAGNOSIS — I129 Hypertensive chronic kidney disease with stage 1 through stage 4 chronic kidney disease, or unspecified chronic kidney disease: Secondary | ICD-10-CM | POA: Diagnosis not present

## 2018-10-09 DIAGNOSIS — I2510 Atherosclerotic heart disease of native coronary artery without angina pectoris: Secondary | ICD-10-CM | POA: Diagnosis present

## 2018-10-09 DIAGNOSIS — N401 Enlarged prostate with lower urinary tract symptoms: Secondary | ICD-10-CM | POA: Diagnosis present

## 2018-10-09 DIAGNOSIS — R404 Transient alteration of awareness: Secondary | ICD-10-CM | POA: Diagnosis not present

## 2018-10-09 DIAGNOSIS — J189 Pneumonia, unspecified organism: Secondary | ICD-10-CM | POA: Diagnosis not present

## 2018-10-09 DIAGNOSIS — M6281 Muscle weakness (generalized): Secondary | ICD-10-CM | POA: Diagnosis not present

## 2018-10-09 DIAGNOSIS — K219 Gastro-esophageal reflux disease without esophagitis: Secondary | ICD-10-CM | POA: Diagnosis not present

## 2018-10-09 DIAGNOSIS — Z931 Gastrostomy status: Secondary | ICD-10-CM | POA: Diagnosis not present

## 2018-10-09 DIAGNOSIS — Z955 Presence of coronary angioplasty implant and graft: Secondary | ICD-10-CM | POA: Diagnosis not present

## 2018-10-09 DIAGNOSIS — C7951 Secondary malignant neoplasm of bone: Secondary | ICD-10-CM | POA: Diagnosis not present

## 2018-10-09 DIAGNOSIS — R0902 Hypoxemia: Secondary | ICD-10-CM | POA: Diagnosis not present

## 2018-10-09 DIAGNOSIS — R2689 Other abnormalities of gait and mobility: Secondary | ICD-10-CM | POA: Diagnosis not present

## 2018-10-09 DIAGNOSIS — R0603 Acute respiratory distress: Secondary | ICD-10-CM | POA: Diagnosis not present

## 2018-10-09 DIAGNOSIS — Z433 Encounter for attention to colostomy: Secondary | ICD-10-CM | POA: Diagnosis not present

## 2018-10-09 DIAGNOSIS — D649 Anemia, unspecified: Secondary | ICD-10-CM | POA: Diagnosis present

## 2018-10-09 DIAGNOSIS — R279 Unspecified lack of coordination: Secondary | ICD-10-CM | POA: Diagnosis not present

## 2018-10-09 DIAGNOSIS — Z743 Need for continuous supervision: Secondary | ICD-10-CM | POA: Diagnosis not present

## 2018-10-09 DIAGNOSIS — R05 Cough: Secondary | ICD-10-CM | POA: Diagnosis not present

## 2018-10-09 DIAGNOSIS — Z789 Other specified health status: Secondary | ICD-10-CM | POA: Diagnosis not present

## 2018-10-09 DIAGNOSIS — R2681 Unsteadiness on feet: Secondary | ICD-10-CM | POA: Diagnosis not present

## 2018-10-09 DIAGNOSIS — N179 Acute kidney failure, unspecified: Secondary | ICD-10-CM | POA: Diagnosis not present

## 2018-10-09 DIAGNOSIS — I959 Hypotension, unspecified: Secondary | ICD-10-CM | POA: Diagnosis not present

## 2018-10-09 DIAGNOSIS — N400 Enlarged prostate without lower urinary tract symptoms: Secondary | ICD-10-CM | POA: Diagnosis not present

## 2018-10-09 DIAGNOSIS — R110 Nausea: Secondary | ICD-10-CM | POA: Diagnosis not present

## 2018-10-09 DIAGNOSIS — E7800 Pure hypercholesterolemia, unspecified: Secondary | ICD-10-CM | POA: Diagnosis present

## 2018-10-09 DIAGNOSIS — R471 Dysarthria and anarthria: Secondary | ICD-10-CM | POA: Diagnosis not present

## 2018-10-09 DIAGNOSIS — E43 Unspecified severe protein-calorie malnutrition: Secondary | ICD-10-CM | POA: Diagnosis present

## 2018-10-09 DIAGNOSIS — R1310 Dysphagia, unspecified: Secondary | ICD-10-CM | POA: Diagnosis not present

## 2018-10-09 DIAGNOSIS — R197 Diarrhea, unspecified: Secondary | ICD-10-CM | POA: Diagnosis not present

## 2018-10-09 DIAGNOSIS — M1990 Unspecified osteoarthritis, unspecified site: Secondary | ICD-10-CM | POA: Diagnosis present

## 2018-10-09 DIAGNOSIS — G629 Polyneuropathy, unspecified: Secondary | ICD-10-CM | POA: Diagnosis not present

## 2018-10-09 DIAGNOSIS — R41841 Cognitive communication deficit: Secondary | ICD-10-CM | POA: Diagnosis not present

## 2018-10-09 DIAGNOSIS — J9601 Acute respiratory failure with hypoxia: Secondary | ICD-10-CM | POA: Diagnosis not present

## 2018-10-09 DIAGNOSIS — R1032 Left lower quadrant pain: Secondary | ICD-10-CM | POA: Diagnosis not present

## 2018-10-09 DIAGNOSIS — R069 Unspecified abnormalities of breathing: Secondary | ICD-10-CM | POA: Diagnosis not present

## 2018-10-09 DIAGNOSIS — J9621 Acute and chronic respiratory failure with hypoxia: Secondary | ICD-10-CM | POA: Diagnosis not present

## 2018-10-09 DIAGNOSIS — E861 Hypovolemia: Secondary | ICD-10-CM | POA: Diagnosis present

## 2018-10-09 DIAGNOSIS — D899 Disorder involving the immune mechanism, unspecified: Secondary | ICD-10-CM | POA: Diagnosis present

## 2018-10-09 DIAGNOSIS — Z6821 Body mass index (BMI) 21.0-21.9, adult: Secondary | ICD-10-CM | POA: Diagnosis not present

## 2018-10-09 DIAGNOSIS — A419 Sepsis, unspecified organism: Secondary | ICD-10-CM | POA: Diagnosis present

## 2018-10-09 DIAGNOSIS — E860 Dehydration: Secondary | ICD-10-CM | POA: Diagnosis present

## 2018-10-09 DIAGNOSIS — Z7409 Other reduced mobility: Secondary | ICD-10-CM | POA: Diagnosis not present

## 2018-10-09 DIAGNOSIS — G620 Drug-induced polyneuropathy: Secondary | ICD-10-CM | POA: Diagnosis not present

## 2018-10-09 DIAGNOSIS — C799 Secondary malignant neoplasm of unspecified site: Secondary | ICD-10-CM | POA: Diagnosis not present

## 2018-10-09 DIAGNOSIS — R6521 Severe sepsis with septic shock: Secondary | ICD-10-CM | POA: Diagnosis not present

## 2018-10-09 DIAGNOSIS — R112 Nausea with vomiting, unspecified: Secondary | ICD-10-CM | POA: Diagnosis not present

## 2018-10-09 DIAGNOSIS — G7281 Critical illness myopathy: Secondary | ICD-10-CM | POA: Diagnosis present

## 2018-10-09 DIAGNOSIS — E785 Hyperlipidemia, unspecified: Secondary | ICD-10-CM | POA: Diagnosis not present

## 2018-10-09 DIAGNOSIS — R9431 Abnormal electrocardiogram [ECG] [EKG]: Secondary | ICD-10-CM | POA: Diagnosis not present

## 2018-10-09 DIAGNOSIS — I4891 Unspecified atrial fibrillation: Secondary | ICD-10-CM | POA: Diagnosis not present

## 2018-10-09 DIAGNOSIS — J690 Pneumonitis due to inhalation of food and vomit: Secondary | ICD-10-CM | POA: Diagnosis not present

## 2018-10-09 DIAGNOSIS — Z9981 Dependence on supplemental oxygen: Secondary | ICD-10-CM | POA: Diagnosis not present

## 2018-10-12 ENCOUNTER — Non-Acute Institutional Stay (SKILLED_NURSING_FACILITY): Payer: Medicare Other | Admitting: Internal Medicine

## 2018-10-12 ENCOUNTER — Encounter: Payer: Self-pay | Admitting: Internal Medicine

## 2018-10-12 DIAGNOSIS — G629 Polyneuropathy, unspecified: Secondary | ICD-10-CM | POA: Insufficient documentation

## 2018-10-12 DIAGNOSIS — K219 Gastro-esophageal reflux disease without esophagitis: Secondary | ICD-10-CM | POA: Diagnosis not present

## 2018-10-12 DIAGNOSIS — J9601 Acute respiratory failure with hypoxia: Secondary | ICD-10-CM

## 2018-10-12 DIAGNOSIS — C089 Malignant neoplasm of major salivary gland, unspecified: Secondary | ICD-10-CM | POA: Diagnosis not present

## 2018-10-12 DIAGNOSIS — C799 Secondary malignant neoplasm of unspecified site: Secondary | ICD-10-CM

## 2018-10-12 DIAGNOSIS — R112 Nausea with vomiting, unspecified: Secondary | ICD-10-CM

## 2018-10-12 DIAGNOSIS — C7951 Secondary malignant neoplasm of bone: Secondary | ICD-10-CM | POA: Diagnosis not present

## 2018-10-12 DIAGNOSIS — G620 Drug-induced polyneuropathy: Secondary | ICD-10-CM

## 2018-10-12 DIAGNOSIS — N400 Enlarged prostate without lower urinary tract symptoms: Secondary | ICD-10-CM | POA: Diagnosis not present

## 2018-10-12 DIAGNOSIS — R197 Diarrhea, unspecified: Secondary | ICD-10-CM | POA: Diagnosis not present

## 2018-10-12 DIAGNOSIS — R6521 Severe sepsis with septic shock: Secondary | ICD-10-CM | POA: Diagnosis not present

## 2018-10-12 DIAGNOSIS — J9621 Acute and chronic respiratory failure with hypoxia: Secondary | ICD-10-CM

## 2018-10-12 DIAGNOSIS — A419 Sepsis, unspecified organism: Secondary | ICD-10-CM | POA: Insufficient documentation

## 2018-10-12 NOTE — Progress Notes (Signed)
:  Location:  [facility] Room Number: 100P Place of Service:  SNF (31)  Jose Horsey D. Sheppard Coil, MD  Patient Care Team: Leighton Ruff, MD as PCP - General (Family Medicine) Sherren Mocha, MD as PCP - Cardiology (Cardiology)  Extended Emergency Contact Information Primary Emergency Contact: Demaris Callander Address: [address]          [place] [address] [place] Phone: [phone] Relation: Spouse     Allergies: Demerol and Myrbetriq [mirabegron]  Chief Complaint  Patient presents with  . New Admit To SNF    Admit to [facility]    HPI: Patient is 83 y.o. 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  Usually after tube feedings.  Patient was admitted to [facility] from 10/18-11/1 where she was diagnosed and treated for sepsis secondary to aspiration pneumonia and acute hypoxic respiratory failure.,  Which presented with development of fever, Reiger's, and the need for 100% NRB.  CODE STATUS was assessed discussed with family and patient was made DNR.  Vancomycin and Zosyn was started and then he was transferred to the MICU.  It was felt the most likely source of his sepsis was aspiration in setting of noncompliance with n.p.o. and placing foods down his G-tube.  Oxygen was able to be wound to room air and antibiotics were subsequently de-escalated to Augmentin and he completed 10-day course of treatment for aspiration pneumonia.  Patient continued to have diarrhea throughout his stay at the hospital and he was tried on several symptomatic agents.  At the time of discharge his symptomatic regimen was Lomotil,  guard gum, and opium tincture which was adequately controlling his diarrhea.  Patient was diagnosed in 2017 with metastatic adenocarcinoma of the right submandibular gland and is status post radiation, neck dissection and chemotherapy.  He is G-tube dependent.  Of note, his bone and liver medicine progress based on imaging on 10/19.  He is most recently on [place] for therapy.  Patient is admitted to skilled nursing facility for OT/PT.  While at skilled nursing facility followed for neuropathy treated with Neurontin, BPH treated with Flomax and GERD treated with Protonix.  Past Medical History:  Diagnosis Date  . Arthritis   . 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 Decreased oral intake   . Encounter for antineoplastic chemotherapy 05/27/2016  . Hearing loss   . High cholesterol   . History of chemotherapy   . History of echocardiogram    Echo 8/14:  EF 55-60%, Gr 1 DD  . Hx of cardiovascular stress test    a.  ETT-Myoview 8/14:  No scar or ischemia, EF 52%, normal study;  b. ETT-Myoview:  EF 55%, no ST changes, no ischemia; Normal   . Hyperlipidemia   . Hypertension   . IHD (ischemic heart disease)    Remote PCI to distal RCA and atherectomy of th LAD in 1996; S/P PCI to LCX and RCA in 2010.  . Metastatic adenocarcinoma ([place]) 04/04/2016  . Renal insufficiency    on Diovan  . Speech disorder   . Submandibular lymphadenopathy 03/21/2016  . Tonsil cancer [facility])     Past Surgical History:  Procedure  Laterality Date  . CARDIAC CATHETERIZATION  09/13/2009  . CARDIAC CATHETERIZATION  09/04/2009   EF 60%  . CARDIOVASCULAR STRESS TEST  12/08/2007   EF 57%  . CATARACT EXTRACTION Right   . FREE FLAP RADIAL FOREARM Right 03/28/2017  . NECK DISSECTION Bilateral 03/28/2017  . PCI  08/2009   TO THE LCX AND RCA  . resection floor of mouth cancer Right 03/28/2017  . TONSILLECTOMY    . TRACHEOSTOMY  03/28/2017    Allergies as of 10/12/2018      Reactions   Demerol    Facial swelling   Myrbetriq  [mirabegron] Other (See Comments)   Pt had problem urinating, was unable to urinate at all..      Medication List        Accurate as of 10/12/18 10:56 AM. Always use your most recent med list.          Acetaminophen 650 MG/20.3ML Soln Take 20.3 mLs by mouth every 4 (four) hours as needed.   albuterol (2.5 MG/3ML) 0.083% nebulizer solution Commonly known as:  PROVENTIL Take 2.5 mg by nebulization every 4 (four) hours as needed for wheezing or shortness of breath.   amoxicillin-clavulanate 875-125 MG tablet Commonly known as:  AUGMENTIN Take 1 tablet by mouth. Take 1 tablet by muth every 12 hours for 5 doses   diphenoxylate-atropine 2.5-0.025 MG tablet Commonly known as:  LOMOTIL Take by mouth 4 (four) times daily as needed for diarrhea or loose stools.   gabapentin 300 MG capsule Commonly known as:  NEURONTIN Take 300 mg by mouth 3 (three) times daily.   guaiFENesin-codeine 100-10 MG/5ML syrup Commonly known as:  ROBITUSSIN AC Take 5 mLs by mouth. 10 mls per G tube every 4 hours as needed for up to 10 days for cough or congestion   guar gum powder Take by mouth. 1 packet per G tube route every 3 hours while awake   imatinib 400 MG tablet Commonly known as:  GLEEVEC Take 400 mg by mouth daily. Take with meals and large glass of water.Caution:Chemotherapy.   loperamide 2 MG tablet Commonly known as:  IMODIUM A-D Take 2 mg by mouth 4 (four) times daily as needed for diarrhea or loose stools.   nitroGLYCERIN 0.4 MG SL tablet Commonly known as:  NITROSTAT Place 0.4 mg under the tongue every 5 (five) minutes as needed for chest pain.   ondansetron 8 MG tablet Commonly known as:  ZOFRAN Take by mouth every 8 (eight) hours as needed for nausea or vomiting.   OPIUM TINCTURE (PAREGORIC) PO Take by mouth. 10 mg/ml (Morphine) 0.63mls (6 mg total) per G tube every 6 hours   oxyCODONE 5 MG/5ML solution Commonly known as:  ROXICODONE Take 5 mg by mouth every 4 (four) hours as  needed for severe pain.   pantoprazole 20 MG tablet Commonly known as:  PROTONIX Take 20 mg by mouth daily.   tamsulosin 0.4 MG Caps capsule Commonly known as:  FLOMAX Take 0.4 mg by mouth every evening.       No orders of the defined types were placed in this encounter.   Immunization History  Administered Date(s) Administered  . Influenza-Unspecified 11/28/2016  . Tdap 08/28/2016    Social History   Tobacco Use  . Smoking status: Never Smoker  . Smokeless tobacco: Never Used  Substance Use Topics  . Alcohol use: Yes    Comment: occasional    Family history is   Family History  Problem Relation Age of  Onset  . Stroke Mother   . Heart disease Father       Review of Systems  DATA OBTAINED: from patient, nurse GENERAL:  no fevers, fatigue, appetite changes SKIN: No itching, or rash EYES: No eye pain, redness, discharge EARS: No earache, tinnitus, change in hearing NOSE: No congestion, drainage or bleeding  MOUTH/THROAT: No mouth or tooth pain, No sore throat RESPIRATORY: No cough, wheezing, SOB CARDIAC: No chest pain, palpitations, lower extremity edema  GI: No abdominal pain, No N/V/D or constipation, No heartburn or reflux  GU: No dysuria, frequency or urgency, or incontinence  MUSCULOSKELETAL: No unrelieved bone/joint pain NEUROLOGIC: No headache, dizziness or focal weakness PSYCHIATRIC: No c/o anxiety or sadness   Vitals:   10/12/18 1021  BP: 118/76  Pulse: 60  Resp: 18  Temp: (!) 96.9 F (36.1 C)  SpO2: 94%    SpO2 Readings from Last 1 Encounters:  10/12/18 94%   Body mass index is 20.48 kg/m.     Physical Exam  GENERAL APPEARANCE: Alert, conversant,  No acute distress.  SKIN: No diaphoresis rash HEAD: Normocephalic, atraumatic  EYES: Conjunctiva/lids clear. Pupils round, reactive. EOMs intact.  EARS: External exam WNL, canals clear. Hearing grossly normal.  NOSE: No deformity or discharge.  MOUTH/THROAT: Lips w/o lesions    RESPIRATORY: Breathing is even, unlabored. Lung sounds are clear   CARDIOVASCULAR: Heart RRR no murmurs, rubs or gallops. No peripheral edema.   GASTROINTESTINAL: Abdomen is soft, non-tender, not distended w/ normal bowel sounds.  G-tube GENITOURINARY: Bladder non tender, not distended  MUSCULOSKELETAL: No abnormal joints or musculature NEUROLOGIC:  Cranial nerves 2-12 grossly intact. Moves all extremities  PSYCHIATRIC: Mood and affect appropriate to situation, no behavioral issues  Patient Active Problem List   Diagnosis Date Noted  . Bone metastases ([place]) 06/08/2016  . Encounter for antineoplastic chemotherapy 05/27/2016  . 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 Metastatic adenocarcinoma ([place]) 04/04/2016  . Submandibular lymphadenopathy 03/21/2016  . Lytic lesion of bone on x-ray 03/21/2016  . Lung mass 03/13/2016  . Lipoma of buttock 12/16/2011  . CAD (coronary artery disease) 07/29/2011  . Hyperlipidemia 07/29/2011  . CRI (chronic renal insufficiency) 07/29/2011  . HTN (hypertension) 07/29/2011      Labs reviewed: Basic Metabolic Panel:    Component Value Date/Time   NA 142 01/27/2017 0940   K 4.7 01/27/2017 0940   CL 106 08/28/2016 1230   CO2 24 01/27/2017 0940   GLUCOSE 105 01/27/2017 0940   BUN 21.2 01/27/2017 0940   CREATININE 1.6 (H) 01/27/2017 0940   CALCIUM 9.7 01/27/2017 0940   PROT 6.9 01/27/2017 0940   ALBUMIN 4.0 01/27/2017 0940   AST 18 01/27/2017 0940   ALT 17 01/27/2017 0940   ALKPHOS 90 01/27/2017 0940   BILITOT 0.61 01/27/2017 0940   GFRNONAA 46 (L) 08/28/2016 1230   GFRAA 53 (L) 08/28/2016 1230    No results for input(s): NA, K, CL, CO2, GLUCOSE, BUN, CREATININE, CALCIUM, MG, PHOS in the last 8760 hours. Liver Function Tests: No results for input(s): AST, ALT, ALKPHOS, BILITOT, PROT, ALBUMIN in the last 8760 hours. No results for input(s): LIPASE, AMYLASE in the last 8760  hours. No results for input(s): AMMONIA in the last 8760 hours. CBC: No results for input(s): WBC, NEUTROABS, HGB, HCT, MCV, PLT in the last 8760 hours. Lipid No results for input(s): CHOL, HDL, LDLCALC, TRIG in the last 8760 hours.  Cardiac Enzymes: No results for input(s):  CKTOTAL, CKMB, CKMBINDEX, TROPONINI in the last 8760 hours. BNP: No results for input(s): BNP in the last 8760 hours. No results found for: MICROALBUR No results found for: HGBA1C No results found for: TSH No results found for: VITAMINB12 No results found for: FOLATE No results found for: IRON, TIBC, FERRITIN  Imaging and Procedures obtained prior to SNF admission: Ct Soft Tissue Neck W Contrast  Result Date: 01/27/2017 CLINICAL DATA:  Mucoepidermoid carcinoma salivary gland. Post chemo and radiation. Restaging EXAM: CT NECK WITH CONTRAST TECHNIQUE: Multidetector CT imaging of the neck was performed using the standard protocol following the bolus administration of intravenous contrast. CONTRAST:  1mL ISOVUE-300 IOPAMIDOL (ISOVUE-300) INJECTION 61% COMPARISON:  CT neck 10/25/2016, 08/28/2016, 09/05/2016 PET 03/20/2016 FINDINGS: Pharynx and larynx: Normal. No mass or swelling. Salivary glands: Enlarged right submandibular gland measuring 30 x 26 mm. This shows progressive enlargement from prior studies. Most recently this measured 25 x 24 mm. Fairly homogeneous enhancement of the mass. There is also progressive enlargement of the submandibular gland on the right. Left submandibular gland is either hypoplastic or surgically absent. There appears to be a surgical defect in the left neck. Parotid gland normal bilaterally. Thyroid: Negative Lymph nodes: No pathologic lymph nodes in the neck. Vascular: Atherosclerotic calcification of the carotid bifurcation bilaterally. Carotid artery and jugular vein patent bilaterally. Limited intracranial: Negative Visualized orbits: Negative Mastoids and visualized paranasal sinuses: Negative  Skeleton: Cervical disc degeneration. No acute skeletal abnormality. Upper chest: Chest CT from today reported separately Other: None IMPRESSION: Progressive enlargement of right submandibular gland and right sub lingual gland compared to prior study. Given the history, this is most left most likely due to active growth of tumor. No malignant adenopathy. Electronically Signed   By: Franchot Gallo M.D.   On: 01/27/2017 13:10   Ct Chest W Contrast  Result Date: 01/27/2017 CLINICAL DATA:  Metastatic adenocarcinoma of salivary gland. Previous chemotherapy and radiation therapy. EXAM: CT CHEST WITH CONTRAST TECHNIQUE: Multidetector CT imaging of the chest was performed during intravenous contrast administration. CONTRAST:  47mL ISOVUE-300 IOPAMIDOL (ISOVUE-300) INJECTION 61% COMPARISON:  10/25/2016 FINDINGS: Cardiovascular: No acute findings. Aortic and coronary artery atherosclerosis. Mediastinum/Nodes: No masses or pathologically enlarged lymph nodes identified. Stable sub-cm right thyroid lobe nodule. Lungs/Pleura: Stable post radiation changes in left lower lobe. Stable 6 mm pulmonary nodule in posterior right lower lobe on image 123/5. No evidence pleural effusion. Upper Abdomen:  Unremarkable. Musculoskeletal: Stable lytic bone lesion involving left posterior seventh rib. No other suspicious bone lesions identified. IMPRESSION: Stable appearance of bone metastasis in the left posterior seventh rib. Stable adjacent radiation changes in left lower lobe. Stable 6 mm posterior right lower lobe pulmonary nodule. No new or progressive metastatic disease within the thorax. Electronically Signed   By: Earle Gell M.D.   On: 01/27/2017 14:36     Not all labs, radiology exams or other studies done during hospitalization come through on my EPIC note; however they are reviewed by me.    Assessment and Plan  Sepsis/aspiration pneumonia/acute respiratory failure with hypoxia- treated with Vanco/Zosyn, requiring  Levophed due to persistent hypotension; it is felt most likely source of his sepsis was aspiration in the setting of being noncompliant with n.p.o., and placing food down his G-tube.  Patient was stabilized and antibiotics were de-escalated to Augmentin and he will complete a 10-day course after 5 more pills SNF- continue Augmentin 875 every 12 hours for 5 more doses; continue guaifenesin AC 10 mils per 2 every 4 hours as  needed; patient has PRN albuterol, will change this to scheduled DuoNeb 3 times daily.  Nausea and diarrhea- original presentation; GIP and C. difficile testing were negative.  CT of the abdomen and pelvis demonstrated moderate stool burden no obstruction and worsening metastatic disease to pelvis.  During his hospitalization he was trialed on several agents but at discharge his regimen is Lomotil, guard gum, and opiate tincture which appears to be controlling his diarrhea SNF- patient's Gleevec has been held and patient's diarrhea has stopped; patient's family immediately asked me not to restart his [place] until he sees his oncologist; patient's oncologist called the patient while those in the room and family member scheduled a meeting with him on Thursday so Gleevec will be held until he meets with his oncologist on Thursday  Metastatic adeno CA of the right mandibular gland- diagnosed 2017 with metastatic adenocarcinoma at that time; status post radiation, neck dissection and chemotherapy; now G-tube dependent; of note his bone and liver mets have progressed based on imaging on 10/19; SNF- patient apparently has not been tolerating [facility] per family and Gleevec has been held at the nursing home and patient's diarrhea and nausea has cleared up; will hold on patient's Gleevec until follow-up with oncology on Thursday; of note patient's hemoglobin on 11 1 is 8.2 so it is possible he will get a blood transfusion while he is at his office visit on to [facility] oncology on  Thursday  Peripheral neuropathy SNF- continue Neurontin 300 mg nightly  BPH SNF- continue Flomax 0.4 mg daily  GERD SNF- Protonix 40 mg per tube twice daily   Spent greater than 45 minutes;> 50% of time with patient was spent reviewing records, labs, tests and studies, counseling and developing plan of care  Noah Delaine. Sheppard Coil, MD

## 2018-10-13 DIAGNOSIS — I129 Hypertensive chronic kidney disease with stage 1 through stage 4 chronic kidney disease, or unspecified chronic kidney disease: Secondary | ICD-10-CM | POA: Diagnosis present

## 2018-10-13 DIAGNOSIS — Z66 Do not resuscitate: Secondary | ICD-10-CM | POA: Diagnosis not present

## 2018-10-13 DIAGNOSIS — R339 Retention of urine, unspecified: Secondary | ICD-10-CM | POA: Diagnosis not present

## 2018-10-13 DIAGNOSIS — R404 Transient alteration of awareness: Secondary | ICD-10-CM | POA: Diagnosis not present

## 2018-10-13 DIAGNOSIS — Z960 Presence of urogenital implants: Secondary | ICD-10-CM | POA: Diagnosis not present

## 2018-10-13 DIAGNOSIS — Z431 Encounter for attention to gastrostomy: Secondary | ICD-10-CM | POA: Diagnosis not present

## 2018-10-13 DIAGNOSIS — E861 Hypovolemia: Secondary | ICD-10-CM | POA: Diagnosis present

## 2018-10-13 DIAGNOSIS — Z7409 Other reduced mobility: Secondary | ICD-10-CM | POA: Diagnosis not present

## 2018-10-13 DIAGNOSIS — R279 Unspecified lack of coordination: Secondary | ICD-10-CM | POA: Diagnosis not present

## 2018-10-13 DIAGNOSIS — C787 Secondary malignant neoplasm of liver and intrahepatic bile duct: Secondary | ICD-10-CM | POA: Diagnosis not present

## 2018-10-13 DIAGNOSIS — Z955 Presence of coronary angioplasty implant and graft: Secondary | ICD-10-CM | POA: Diagnosis not present

## 2018-10-13 DIAGNOSIS — D649 Anemia, unspecified: Secondary | ICD-10-CM | POA: Diagnosis not present

## 2018-10-13 DIAGNOSIS — R0603 Acute respiratory distress: Secondary | ICD-10-CM | POA: Diagnosis not present

## 2018-10-13 DIAGNOSIS — C080 Malignant neoplasm of submandibular gland: Secondary | ICD-10-CM | POA: Diagnosis not present

## 2018-10-13 DIAGNOSIS — G9341 Metabolic encephalopathy: Secondary | ICD-10-CM | POA: Diagnosis present

## 2018-10-13 DIAGNOSIS — E7800 Pure hypercholesterolemia, unspecified: Secondary | ICD-10-CM | POA: Diagnosis present

## 2018-10-13 DIAGNOSIS — L89151 Pressure ulcer of sacral region, stage 1: Secondary | ICD-10-CM | POA: Diagnosis not present

## 2018-10-13 DIAGNOSIS — Z789 Other specified health status: Secondary | ICD-10-CM | POA: Diagnosis not present

## 2018-10-13 DIAGNOSIS — R9431 Abnormal electrocardiogram [ECG] [EKG]: Secondary | ICD-10-CM | POA: Diagnosis not present

## 2018-10-13 DIAGNOSIS — J189 Pneumonia, unspecified organism: Secondary | ICD-10-CM | POA: Diagnosis not present

## 2018-10-13 DIAGNOSIS — E43 Unspecified severe protein-calorie malnutrition: Secondary | ICD-10-CM | POA: Diagnosis not present

## 2018-10-13 DIAGNOSIS — R0902 Hypoxemia: Secondary | ICD-10-CM | POA: Diagnosis not present

## 2018-10-13 DIAGNOSIS — G9340 Encephalopathy, unspecified: Secondary | ICD-10-CM | POA: Diagnosis not present

## 2018-10-13 DIAGNOSIS — N179 Acute kidney failure, unspecified: Secondary | ICD-10-CM | POA: Diagnosis not present

## 2018-10-13 DIAGNOSIS — C089 Malignant neoplasm of major salivary gland, unspecified: Secondary | ICD-10-CM | POA: Diagnosis not present

## 2018-10-13 DIAGNOSIS — Z9221 Personal history of antineoplastic chemotherapy: Secondary | ICD-10-CM | POA: Diagnosis not present

## 2018-10-13 DIAGNOSIS — N183 Chronic kidney disease, stage 3 (moderate): Secondary | ICD-10-CM | POA: Diagnosis not present

## 2018-10-13 DIAGNOSIS — C7951 Secondary malignant neoplasm of bone: Secondary | ICD-10-CM | POA: Diagnosis not present

## 2018-10-13 DIAGNOSIS — G7281 Critical illness myopathy: Secondary | ICD-10-CM | POA: Diagnosis not present

## 2018-10-13 DIAGNOSIS — Z743 Need for continuous supervision: Secondary | ICD-10-CM | POA: Diagnosis not present

## 2018-10-13 DIAGNOSIS — J9601 Acute respiratory failure with hypoxia: Secondary | ICD-10-CM | POA: Diagnosis not present

## 2018-10-13 DIAGNOSIS — E785 Hyperlipidemia, unspecified: Secondary | ICD-10-CM | POA: Diagnosis present

## 2018-10-13 DIAGNOSIS — Z931 Gastrostomy status: Secondary | ICD-10-CM | POA: Diagnosis not present

## 2018-10-13 DIAGNOSIS — I2510 Atherosclerotic heart disease of native coronary artery without angina pectoris: Secondary | ICD-10-CM | POA: Diagnosis present

## 2018-10-13 DIAGNOSIS — M1990 Unspecified osteoarthritis, unspecified site: Secondary | ICD-10-CM | POA: Diagnosis present

## 2018-10-13 DIAGNOSIS — I4891 Unspecified atrial fibrillation: Secondary | ICD-10-CM | POA: Diagnosis not present

## 2018-10-13 DIAGNOSIS — R000 Tachycardia, unspecified: Secondary | ICD-10-CM | POA: Diagnosis not present

## 2018-10-13 DIAGNOSIS — E860 Dehydration: Secondary | ICD-10-CM | POA: Diagnosis present

## 2018-10-13 DIAGNOSIS — J690 Pneumonitis due to inhalation of food and vomit: Secondary | ICD-10-CM | POA: Diagnosis not present

## 2018-10-13 DIAGNOSIS — R069 Unspecified abnormalities of breathing: Secondary | ICD-10-CM | POA: Diagnosis not present

## 2018-10-13 DIAGNOSIS — R05 Cough: Secondary | ICD-10-CM | POA: Diagnosis not present

## 2018-10-13 DIAGNOSIS — Z6821 Body mass index (BMI) 21.0-21.9, adult: Secondary | ICD-10-CM | POA: Diagnosis not present

## 2018-10-13 DIAGNOSIS — I959 Hypotension, unspecified: Secondary | ICD-10-CM | POA: Diagnosis not present

## 2018-10-13 DIAGNOSIS — Z6822 Body mass index (BMI) 22.0-22.9, adult: Secondary | ICD-10-CM | POA: Diagnosis not present

## 2018-10-13 DIAGNOSIS — D899 Disorder involving the immune mechanism, unspecified: Secondary | ICD-10-CM | POA: Diagnosis present

## 2018-10-13 DIAGNOSIS — Z9981 Dependence on supplemental oxygen: Secondary | ICD-10-CM | POA: Diagnosis not present

## 2018-10-13 DIAGNOSIS — A419 Sepsis, unspecified organism: Secondary | ICD-10-CM | POA: Diagnosis present

## 2018-10-13 DIAGNOSIS — N401 Enlarged prostate with lower urinary tract symptoms: Secondary | ICD-10-CM | POA: Diagnosis present

## 2018-11-08 DEATH — deceased
